# Patient Record
Sex: Female | Born: 1935 | Race: White | Hispanic: No | Smoking: Never smoker
Health system: Southern US, Community
[De-identification: ages and names within clinical notes are randomized; demographics above are authoritative.]

## PROBLEM LIST (undated history)

## (undated) ENCOUNTER — Emergency Department: Admission: EM | Payer: Medicare Other | Source: Home / Self Care

## (undated) ENCOUNTER — Emergency Department (HOSPITAL_BASED_OUTPATIENT_CLINIC_OR_DEPARTMENT_OTHER): Discharge status: Absent without leave | Payer: Medicare Other

## (undated) DIAGNOSIS — F419 Anxiety disorder, unspecified: Secondary | ICD-10-CM

## (undated) DIAGNOSIS — C541 Malignant neoplasm of endometrium: Secondary | ICD-10-CM

## (undated) DIAGNOSIS — I4892 Unspecified atrial flutter: Secondary | ICD-10-CM

## (undated) DIAGNOSIS — J45909 Unspecified asthma, uncomplicated: Secondary | ICD-10-CM

## (undated) DIAGNOSIS — S42301A Unspecified fracture of shaft of humerus, right arm, initial encounter for closed fracture: Secondary | ICD-10-CM

## (undated) DIAGNOSIS — E039 Hypothyroidism, unspecified: Secondary | ICD-10-CM

## (undated) DIAGNOSIS — C50919 Malignant neoplasm of unspecified site of unspecified female breast: Secondary | ICD-10-CM

## (undated) DIAGNOSIS — M810 Age-related osteoporosis without current pathological fracture: Secondary | ICD-10-CM

## (undated) HISTORY — DX: Age-related osteoporosis without current pathological fracture: M81.0

## (undated) HISTORY — DX: Unspecified atrial flutter: I48.92

## (undated) HISTORY — DX: Malignant neoplasm of unspecified site of unspecified female breast: C50.919

## (undated) HISTORY — DX: Unspecified asthma, uncomplicated: J45.909

## (undated) HISTORY — DX: Unspecified fracture of shaft of humerus, right arm, initial encounter for closed fracture: S42.301A

## (undated) HISTORY — DX: Malignant neoplasm of endometrium: C54.1

---

## 1994-11-29 HISTORY — PX: OTHER SURGICAL HISTORY: SHX169

## 1999-05-05 ENCOUNTER — Ambulatory Visit (HOSPITAL_COMMUNITY): Admission: RE | Admit: 1999-05-05 | Discharge: 1999-05-05 | Payer: Self-pay | Admitting: Obstetrics & Gynecology

## 2001-08-29 HISTORY — PX: VAGINAL HYSTERECTOMY: SHX2639

## 2005-10-19 ENCOUNTER — Ambulatory Visit: Payer: Self-pay | Admitting: General Practice

## 2005-10-19 ENCOUNTER — Other Ambulatory Visit: Payer: Self-pay

## 2005-10-22 ENCOUNTER — Ambulatory Visit: Payer: Self-pay | Admitting: General Practice

## 2008-11-29 DIAGNOSIS — S42301A Unspecified fracture of shaft of humerus, right arm, initial encounter for closed fracture: Secondary | ICD-10-CM

## 2008-11-29 HISTORY — DX: Unspecified fracture of shaft of humerus, right arm, initial encounter for closed fracture: S42.301A

## 2010-01-29 DIAGNOSIS — C4492 Squamous cell carcinoma of skin, unspecified: Secondary | ICD-10-CM

## 2010-01-29 HISTORY — DX: Squamous cell carcinoma of skin, unspecified: C44.92

## 2010-03-29 ENCOUNTER — Ambulatory Visit: Payer: Self-pay | Admitting: Gynecologic Oncology

## 2010-04-14 ENCOUNTER — Ambulatory Visit: Payer: Self-pay | Admitting: Gynecologic Oncology

## 2010-04-29 ENCOUNTER — Ambulatory Visit: Payer: Self-pay | Admitting: Gynecologic Oncology

## 2011-04-20 ENCOUNTER — Ambulatory Visit: Payer: Self-pay | Admitting: Gynecologic Oncology

## 2011-04-30 ENCOUNTER — Ambulatory Visit: Payer: Self-pay | Admitting: Gynecologic Oncology

## 2012-03-15 ENCOUNTER — Emergency Department: Payer: Self-pay | Admitting: *Deleted

## 2012-04-18 ENCOUNTER — Ambulatory Visit: Payer: Self-pay | Admitting: Gynecologic Oncology

## 2012-04-29 ENCOUNTER — Ambulatory Visit: Payer: Self-pay | Admitting: Gynecologic Oncology

## 2013-04-16 ENCOUNTER — Ambulatory Visit: Payer: Self-pay | Admitting: Gynecologic Oncology

## 2013-04-29 ENCOUNTER — Ambulatory Visit: Payer: Self-pay | Admitting: Gynecologic Oncology

## 2014-04-16 ENCOUNTER — Ambulatory Visit: Payer: Self-pay | Admitting: Gynecologic Oncology

## 2014-04-29 ENCOUNTER — Ambulatory Visit: Payer: Self-pay | Admitting: Gynecologic Oncology

## 2015-04-23 ENCOUNTER — Ambulatory Visit: Payer: Self-pay

## 2015-06-14 ENCOUNTER — Emergency Department
Admission: EM | Admit: 2015-06-14 | Discharge: 2015-06-14 | Disposition: A | Discharge status: Home / Self Care | Payer: Medicare Other | Attending: Emergency Medicine | Admitting: Emergency Medicine

## 2015-06-14 ENCOUNTER — Encounter: Payer: Self-pay | Admitting: Emergency Medicine

## 2015-06-14 ENCOUNTER — Emergency Department: Payer: Medicare Other

## 2015-06-14 DIAGNOSIS — Y9289 Other specified places as the place of occurrence of the external cause: Secondary | ICD-10-CM | POA: Insufficient documentation

## 2015-06-14 DIAGNOSIS — S0990XA Unspecified injury of head, initial encounter: Secondary | ICD-10-CM | POA: Diagnosis present

## 2015-06-14 DIAGNOSIS — S8001XA Contusion of right knee, initial encounter: Secondary | ICD-10-CM | POA: Diagnosis not present

## 2015-06-14 DIAGNOSIS — T07XXXA Unspecified multiple injuries, initial encounter: Secondary | ICD-10-CM

## 2015-06-14 DIAGNOSIS — Y998 Other external cause status: Secondary | ICD-10-CM | POA: Diagnosis not present

## 2015-06-14 DIAGNOSIS — S8002XA Contusion of left knee, initial encounter: Secondary | ICD-10-CM | POA: Diagnosis not present

## 2015-06-14 DIAGNOSIS — S0031XA Abrasion of nose, initial encounter: Secondary | ICD-10-CM | POA: Diagnosis not present

## 2015-06-14 DIAGNOSIS — W01198A Fall on same level from slipping, tripping and stumbling with subsequent striking against other object, initial encounter: Secondary | ICD-10-CM | POA: Diagnosis not present

## 2015-06-14 DIAGNOSIS — S0083XA Contusion of other part of head, initial encounter: Secondary | ICD-10-CM | POA: Diagnosis not present

## 2015-06-14 DIAGNOSIS — Y9389 Activity, other specified: Secondary | ICD-10-CM | POA: Insufficient documentation

## 2015-06-14 DIAGNOSIS — S66911A Strain of unspecified muscle, fascia and tendon at wrist and hand level, right hand, initial encounter: Secondary | ICD-10-CM

## 2015-06-14 DIAGNOSIS — S00511A Abrasion of lip, initial encounter: Secondary | ICD-10-CM | POA: Insufficient documentation

## 2015-06-14 HISTORY — DX: Anxiety disorder, unspecified: F41.9

## 2015-06-14 HISTORY — DX: Hypothyroidism, unspecified: E03.9

## 2015-06-14 NOTE — Discharge Instructions (Signed)
You have been seen in the Emergency Department (ED) today for a fall.  Your work up does not show any concerning injuries.  Please take over-the-counter ibuprofen and/or Tylenol as needed for your pain (unless you have an allergy or your doctor as told you not to take them), or take any prescribed medication as instructed.  Use ice packs on your knees, right wrist, and forehead.  Use your right wrist splint as needed for comfort.  Please follow up with your doctor regarding today's Emergency Department (ED) visit and your recent fall.    Return to the ED if you have any headache, confusion, slurred speech, weakness/numbness of any arm or leg, or any increased pain.   Facial or Scalp Contusion  A facial or scalp contusion is a deep bruise on the face or head. Contusions happen when an injury causes bleeding under the skin. Signs of bruising include pain, puffiness (swelling), and discolored skin. The contusion may turn blue, purple, or yellow. HOME CARE  Only take medicines as told by your doctor.  Put ice on the injured area.  Put ice in a plastic bag.  Place a towel between your skin and the bag.  Leave the ice on for 20 minutes, 2-3 times a day. GET HELP IF:  You have bite problems.  You have pain when chewing.  You are worried about your face not healing normally. GET HELP RIGHT AWAY IF:   You have severe pain or a headache and medicine does not help.  You are very tired or confused, or your personality changes.  You throw up (vomit).  You have a nosebleed that will not stop.  You see two of everything (double vision) or have blurry vision.  You have fluid coming from your nose or ear.  You have problems walking or using your arms or legs. MAKE SURE YOU:   Understand these instructions.  Will watch your condition.  Will get help right away if you are not doing well or get worse. Document Released: 11/04/2011 Document Revised: 09/05/2013 Document Reviewed:  06/28/2013 [facility] Patient Information 2015 [place], [place]. This information is not intended to replace advice given to you by your health care provider. Make sure you discuss any questions you have with your health care provider.  Hematoma A hematoma is a collection of blood under the skin, in an organ, in a body space, in a joint space, or in other tissue. The blood can clot to form a lump that you can see and feel. The lump is often firm and may sometimes become sore and tender. Most hematomas get better in a few days to weeks. However, some hematomas may be serious and require medical care. Hematomas can range in size from very small to very large. CAUSES  A hematoma can be caused by a blunt or penetrating injury. It can also be caused by spontaneous leakage from a blood vessel under the skin. Spontaneous leakage from a blood vessel is more likely to occur in older people, especially those taking blood thinners. Sometimes, a hematoma can develop after certain medical procedures. SIGNS AND SYMPTOMS   A firm lump on the body.  Possible pain and tenderness in the area.  Bruising.Blue, dark blue, purple-red, or yellowish skin may appear at the site of the hematoma if the hematoma is close to the surface of the skin. For hematomas in deeper tissues or body spaces, the signs and symptoms may be subtle. For example, an intra-abdominal hematoma may cause abdominal pain,  weakness, fainting, and shortness of breath. An intracranial hematoma may cause a headache or symptoms such as weakness, trouble speaking, or a change in consciousness. DIAGNOSIS  A hematoma can usually be diagnosed based on your medical history and a physical exam. Imaging tests may be needed if your health care provider suspects a hematoma in deeper tissues or body spaces, such as the abdomen, head, or chest. These tests may include ultrasonography or a CT scan.  TREATMENT  Hematomas usually go away on their own over time. Rarely  does the blood need to be drained out of the body. Large hematomas or those that may affect vital organs will sometimes need surgical drainage or monitoring. HOME CARE INSTRUCTIONS   Apply ice to the injured area:   Put ice in a plastic bag.   Place a towel between your skin and the bag.   Leave the ice on for 20 minutes, 2-3 times a day for the first 1 to 2 days.   After the first 2 days, switch to using warm compresses on the hematoma.   Elevate the injured area to help decrease pain and swelling. Wrapping the area with an elastic bandage may also be helpful. Compression helps to reduce swelling and promotes shrinking of the hematoma. Make sure the bandage is not wrapped too tight.   If your hematoma is on a lower extremity and is painful, crutches may be helpful for a couple days.   Only take over-the-counter or prescription medicines as directed by your health care provider. SEEK IMMEDIATE MEDICAL CARE IF:   You have increasing pain, or your pain is not controlled with medicine.   You have a fever.   You have worsening swelling or discoloration.   Your skin over the hematoma breaks or starts bleeding.   Your hematoma is in your chest or abdomen and you have weakness, shortness of breath, or a change in consciousness.  Your hematoma is on your scalp (caused by a fall or injury) and you have a worsening headache or a change in alertness or consciousness. MAKE SURE YOU:   Understand these instructions.  Will watch your condition.  Will get help right away if you are not doing well or get worse. Document Released: 06/29/2004 Document Revised: 07/18/2013 Document Reviewed: 04/25/2013 [facility] Patient Information 2015 [place], [place]. This information is not intended to replace advice given to you by your health care provider. Make sure you discuss any questions you have with your health care provider.  Wrist Splint A wrist splint holds your wrist in a set position  so that it does not move (fixed position). It can help broken bones and sprains heal faster, with less pain. It can also help relieve pressure on the nerve that runs down the middle of your arm (median nerve) into your fingers.  HOME CARE  Wear your splint as told by your doctor. It may be worn while you sleep.  Exercise your wrist as told by your doctor. These exercises help keep muscle strength in your hand and wrist. They also help to make sure you keep motion in your fingers. GET HELP RIGHT AWAY IF:   You start to lose feeling in your hand or fingers.  Your skin or fingernails turn blue or gray, or they feel cold. MAKE SURE YOU:   Understand these instructions.  Will watch your condition.  Will get help right away if you are not doing well or get worse. Document Released: 05/03/2008 Document Revised: 02/07/2012 Document Reviewed: 02/26/2014  [employer]® Patient Information ©2015 [employer]. This information is not intended to replace advice given to you by your health care provider. Make sure you discuss any questions you have with your health care provider. ° °

## 2015-06-14 NOTE — ED Provider Notes (Signed)
[facility], [place] Emergency Department Provider Note  ____________________________________________  Time seen: Approximately 3:58 PM  I have reviewed the triage vital signs and the nursing notes.   HISTORY  Chief Complaint Fall    HPI Amanda ZARO is a 81 y.o. female With no significant past medical history who presents after a mechanical fall.  She was at a gathering following a funeral when she tripped over someone who was knelt down in prayer.  She fell to the ground and struck her forehead, face and somewhat caught herself with her hands outstretched.  She also struck both knees on the ground.  She did not lose consciousness and was not stunned.  She denies headache, neck pain, shortness of breath, chest pain, abdominal pain.  She endorses mild throbbing pain in her right forehead, her right wrist, and both knees, slightly worse on the left.  She has no pain with active movement of any of her extremities.  She denies the use of any blood thinners other than an 81 mg aspirin that she takes daily.   Past Medical History  Diagnosis Date  . COPD (chronic obstructive pulmonary disease)   . Anxiety   . Hypothyroidism     There are no active problems to display for this patient.   History reviewed. No pertinent past surgical history.  No current outpatient prescriptions on file.  Allergies Review of patient's allergies indicates no known allergies.  History reviewed. No pertinent family history.  Social History History  Substance Use Topics  . Smoking status: Never Smoker   . Smokeless tobacco: Not on file  . Alcohol Use: Yes    Review of Systems Constitutional: No fever/chills Eyes: No visual changes. ENT: No sore throat. Cardiovascular: Denies chest pain. Respiratory: Denies shortness of breath. Gastrointestinal: No abdominal pain.  No nausea, no vomiting.  No diarrhea.  No constipation. Genitourinary: Negative for dysuria. Musculoskeletal:  Negative for back pain.  Negative for neck pain.  Mild throbbing pain in right forehead, right wrist, and bilateral knees. Skin: Negative for rash. Neurological: Negative for headaches, focal weakness or numbness.  10-point ROS otherwise negative.  ____________________________________________   PHYSICAL EXAM:  VITAL SIGNS: ED Triage Vitals  Enc Vitals Group     BP 06/14/15 1547 185/105 mmHg     Pulse Rate 06/14/15 1547 83     Resp 06/14/15 1547 18     Temp --      Temp Source 06/14/15 1547 Oral     SpO2 06/14/15 1547 99 %     Weight 06/14/15 1547 143 lb (64.864 kg)     Height 06/14/15 1547 5\' 1"  (1.549 m)     Head Cir --      Peak Flow --      Pain Score 06/14/15 1550 4     Pain Loc --      Pain Edu? --      Excl. in [place]? --     Constitutional: Alert and oriented. Well appearing and in no acute distress. Eyes: Conjunctivae are normal. PERRL. EOMI. Head: Large hematoma with mild abrasion on the medial right forehead above the right eye.  No hemotympanum.  No Battle sign or raccoon eyes. Nose: No congestion/rhinnorhea.  No epistaxis Mouth/Throat: Mucous membranes are moist.  Oropharynx non-erythematous. Neck: No stridor.  No cervical spine tenderness to palpation.  No pain with full range of motion of the neck including flexion and extension. Cardiovascular: Normal rate, regular rhythm. Grossly normal heart sounds.  Good peripheral  circulation. Respiratory: Normal respiratory effort.  No retractions. Lungs CTAB. Gastrointestinal: Soft and nontender. No distention. No abdominal bruits. No CVA tenderness. Musculoskeletal: No pain or tenderness with active and passive range of motion of any of her extremities.  Her pelvis is stable.  She has a contusion with slight ecchymosis and erythema and some swelling of her left knee, but no pain when waxing or extending the knee.  She has some erythema consistent with a mild contusion on the right knee but no significant effusion.  She reports  some pain in her right wrist but there is no swelling, deformity, tenderness to palpation, nor decrease of strength or sensation.  She has no snuffbox tenderness. Neurologic:  Normal speech and language. No gross focal neurologic deficits are appreciated.  Skin:  Skin is warm, dry and intact except for superficial abrasions to the right side of her upper lip, her nose, and over the hematoma on the right side of her forehead. No rash noted. Psychiatric: Mood and affect are normal. Speech and behavior are normal.  ____________________________________________   LABS (all labs ordered are listed, but only abnormal results are displayed)  Not indicated ____________________________________________  EKG  Not indicated ____________________________________________  RADIOLOGY I, Teja Costen, personally viewed and evaluated these images as part of my medical decision making (plain films)  Dg Wrist Complete Right  06/14/2015   CLINICAL DATA:  81 year old female who tripped over another person and fell with wrist pain. Initial encounter.  EXAM: RIGHT WRIST - COMPLETE 3+ VIEW  COMPARISON:  None.  FINDINGS: There is soft tissue swelling about the wrist. Bone mineralization is within normal limits for age. Distal radius and ulna appear intact. Carpal bone alignment within normal limits. Scaphoid appears intact. No metacarpal fracture identified.  IMPRESSION: Soft tissue swelling but no No acute fracture or dislocation identified about the right wrist.   Electronically Signed   By: Genevie Ann M.D.   On: 06/14/2015 17:53   Dg Knee 2 Views Left  06/14/2015   CLINICAL DATA:  Tripped and fell.  EXAM: LEFT KNEE - 1-2 VIEW  COMPARISON:  None.  FINDINGS: There is no evidence of fracture, dislocation, or joint effusion. There is no evidence of arthropathy or other focal bone abnormality. Marked prepatellar soft tissue swelling noted. No joint effusion.  IMPRESSION: Marked prepatellar soft tissue swelling    Electronically Signed   By: Kerby Moors M.D.   On: 06/14/2015 17:51   Ct Head Wo Contrast  06/14/2015   CLINICAL DATA:  Tripped and fell.  Struck head on floor.  EXAM: CT HEAD WITHOUT CONTRAST  TECHNIQUE: Contiguous axial images were obtained from the base of the skull through the vertex without intravenous contrast.  COMPARISON:  None.  FINDINGS: There is prominence of the sulci and ventricles consistent with brain atrophy. There is mild low attenuation throughout the subcortical and periventricular white matter consistent with chronic microvascular disease. No acute brain infarct, intracranial hemorrhage or mass. No abnormal extra-axial fluid collections identified. There is a large frontal scalp hematoma. This measures 1.7 by 3.7 cm, image 16/series 2. There is mild mucosal thickening involving the left maxillary sinus. The remaining paranasal sinuses are clear. The calvarium is intact.  IMPRESSION: 1. No acute intracranial abnormalities. 2. Chronic microvascular disease and brain atrophy 3. Right frontal scalp hematoma.   Electronically Signed   By: Kerby Moors M.D.   On: 06/14/2015 17:44    ____________________________________________   PROCEDURES  Procedure(s) performed: None  Critical Care performed: No  ____________________________________________   INITIAL IMPRESSION / ASSESSMENT AND PLAN / ED COURSE  Pertinent labs & imaging results that were available during my care of the patient were reviewed by me and considered in my medical decision making (see chart for details).  In spite of the obvious trauma, the patient is well-appearing and in no acute distress.  Her lack of pain/tenderness both passively and to palpation and range of motion of her extremities and her neck is reassuring.  This time I do not believe there is any indication for imaging.  The patient agrees but wants to also discuss it with her husband when he arrives.  I gave her an ice pack for her forehead and her  left knee and I will reassess her once her husband is here.  ----------------------------------------- 6:14 PM on 06/14/2015 -----------------------------------------  Patient's imaging is all reassuring.  I discussed in great detail with the patient and her husband the results of the findings.  I gave them my usual and customary return precautions as well as a removable wrist splint for her right wrist for comfort.  They understand and agree with the plan.   ____________________________________________  FINAL CLINICAL IMPRESSION(S) / ED DIAGNOSES  Final diagnoses:  Forehead contusion, initial encounter  Traumatic hematoma of forehead, initial encounter  Multiple abrasions  Wrist strain, right, initial encounter  Knee contusion, left, initial encounter  Knee contusion, right, initial encounter      NEW MEDICATIONS STARTED DURING THIS VISIT:  There are no discharge medications for this patient.    Hinda Kehr, MD 06/14/15 2350

## 2015-06-14 NOTE — ED Notes (Signed)
Patient tripped over a person that was praying, struck her head. Denies LOC, denies use of thinners. Patient is ambulatory on arrival, presents with a large hematoma to her right eyebrow.

## 2015-12-05 ENCOUNTER — Institutional Professional Consult (permissible substitution): Payer: Medicare Other | Admitting: Internal Medicine

## 2016-02-23 ENCOUNTER — Ambulatory Visit (INDEPENDENT_AMBULATORY_CARE_PROVIDER_SITE_OTHER)
Admission: RE | Admit: 2016-02-23 | Discharge: 2016-02-23 | Disposition: A | Discharge status: Home / Self Care | Payer: Medicare Other | Source: Ambulatory Visit | Attending: Pulmonary Disease | Admitting: Pulmonary Disease

## 2016-02-23 ENCOUNTER — Ambulatory Visit (INDEPENDENT_AMBULATORY_CARE_PROVIDER_SITE_OTHER): Payer: Medicare Other | Admitting: Pulmonary Disease

## 2016-02-23 ENCOUNTER — Encounter (INDEPENDENT_AMBULATORY_CARE_PROVIDER_SITE_OTHER): Payer: Self-pay

## 2016-02-23 ENCOUNTER — Encounter: Payer: Self-pay | Admitting: Pulmonary Disease

## 2016-02-23 VITALS — BP 140/84 | HR 61 | Ht 61.0 in | Wt 145.0 lb

## 2016-02-23 DIAGNOSIS — R938 Abnormal findings on diagnostic imaging of other specified body structures: Secondary | ICD-10-CM

## 2016-02-23 DIAGNOSIS — J449 Chronic obstructive pulmonary disease, unspecified: Secondary | ICD-10-CM | POA: Diagnosis not present

## 2016-02-23 DIAGNOSIS — J45909 Unspecified asthma, uncomplicated: Secondary | ICD-10-CM | POA: Insufficient documentation

## 2016-02-23 DIAGNOSIS — R9389 Abnormal findings on diagnostic imaging of other specified body structures: Secondary | ICD-10-CM

## 2016-02-23 DIAGNOSIS — R942 Abnormal results of pulmonary function studies: Secondary | ICD-10-CM | POA: Diagnosis not present

## 2016-02-23 NOTE — Progress Notes (Signed)
Subjective:    Patient ID: Amanda Dalton, female    DOB: 1931/04/28, 82 y.o.   MRN: [id number]  HPI Chief Complaint  Patient presents with  . Advice Only    Referred by Dr. Kary Kos for COPD- pt switching from Dr. Raul Del to here.     This is a pleasant 82 year old female with a past medical history significant for breast cancer who comes to my clinic for evaluation of possible COPD. 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 She said that as a child she had no respiratory illnesses though she did have diphtheria and she was diagnosed with rheumatic fever at one point.  She says that approximately 3 years ago she was diagnosed with breast cancer and was undergoing treatment at [facility]. At that time she had a chest x-ray which apparently showed COPD. This was surprising to her and her primary care physician and she had minimal respiratory complaints since she was referred to a pulmonologist to then confirmed the diagnosis of COPD. She's been treated with Advair since then.  She tells me though that she has very little respiratory complaints. She will have some shortness of breath with heavy exertion from time to time but she does not have a daily cough, she does not wheeze and she does not have recurrent bronchitis. She's never been hospitalized for respiratory problem. She says that she will have some allergic rhinitis throughout the course of the year but this is not a persistent problem. She says that her pulmonologist is told her on a recent visit that he thought she had more asthma than COPD.  She's here to see me today for a second opinion on her diagnosis.   Past Medical History  Diagnosis Date  . COPD (chronic obstructive pulmonary disease) ([place])   . Anxiety   . Hypothyroidism      Family History  Problem Relation Age of Onset  . Breast cancer Sister      Social History   Social History  .  Marital Status: Married    Spouse Name: N/A  . Number of Children: N/A  . Years of Education: N/A   Occupational History  . Not on file.   Social History Main Topics  . Smoking status: Never Smoker   . Smokeless tobacco: Never Used  . Alcohol Use: 0.0 oz/week    0 Standard drinks or equivalent per week  . Drug Use: Not on file  . Sexual Activity: Not on file   Other Topics Concern  . Not on file   Social History Narrative     No Known Allergies   No outpatient prescriptions prior to visit.   No facility-administered medications prior to visit.       Review of Systems  Constitutional: Negative for fever, chills, diaphoresis, appetite change and fatigue.  HENT: Positive for nosebleeds, rhinorrhea and sinus pressure. Negative for congestion, hearing loss, postnasal drip, sore throat and trouble swallowing.   Eyes: Negative for discharge, redness and visual disturbance.  Respiratory: Negative for cough, choking, chest tightness, shortness of breath and wheezing.   Cardiovascular: Negative for chest pain and leg swelling.  Gastrointestinal: Negative for nausea, abdominal pain, diarrhea, constipation and blood in stool.  Genitourinary: Negative for dysuria, frequency and hematuria.  Musculoskeletal: Negative for myalgias, joint swelling, arthralgias and neck stiffness.  Skin: Negative for color change, pallor and rash.  Neurological: Negative for dizziness, seizures, facial  asymmetry, speech difficulty, light-headedness, numbness and headaches.  Hematological: Negative for adenopathy. Does not bruise/bleed easily.       Objective:   Physical Exam  Filed Vitals:   02/23/16 1113  BP: 140/84  Pulse: 61  Height: 5\' 1"  (1.549 m)  Weight: 145 lb (65.772 kg)  SpO2: 97%   100% RA with walking, dropped to 98% on RA  RA  Gen: well appearing, no acute distress HENT: NCAT, OP clear, neck supple without masses Eyes: PERRL, EOMi Lymph: no cervical lymphadenopathy PULM:  CTA B CV: RRR, no mgr, no JVD GI: BS+, soft, nontender, no hsm Derm: no rash or skin breakdown MSK: normal bulk and tone Neuro: A&Ox4, CN II-XII intact, strength 5/5 in all 4 extremities Psyche: normal mood and affect  Simple spirometry today showed evidence of moderate airflow obstruction  Records from Dr. Raul Del were reviewed where she has been diagnosed with stage II COPD and treated with Advair.     Assessment & Plan:  Abnormal PFT Ms. Ristau has abnormal PFT's but no history of smoking. Further, she only reports allergic rhinitis symptoms and the very rare episode of breathing difficulty.  I tried to press for more details about the dyspnea, and she can only recall one episode of dyspnea and wheezing in the last year.  COPD is a syndrome of shortness of breath, cough, chest tightness typically with episodes of bronchitis. Fortunately it sounds like she does not experience this on a regular basis so at this time though she has abnormal PFTs I cannot diagnose her with that.  She may have a form of asthma given her allergy symptoms. It should be noted that airflow obstruction is not uncommon in octogenarians and so the PFT abnormality may just be due to her age.  Plan: Stop Advair for now Continue albuterol as needed, I've advised her to keep it on her at all times Follow-up with me in 3 months to see how she is doing Chest x-ray today to look for evidence of underlying lung disease given the airflow abnormality seen on PFT     Current outpatient prescriptions:  .  Albuterol Sulfate (PROAIR RESPICLICK) 123XX123 (90 Base) MCG/ACT AEPB, Inhale 2 puffs into the lungs every 6 (six) hours as needed., Disp: , Rfl:  .  aspirin 81 MG tablet, Take 81 mg by mouth daily., Disp: , Rfl:  .  Calcium Carbonate-Vitamin D (CALCIUM-VITAMIN D) 500-200 MG-UNIT tablet, Take 1 tablet by mouth daily., Disp: , Rfl:  .  cetirizine (ZYRTEC) 10 MG chewable tablet, Chew 10 mg by mouth daily., Disp: , Rfl:  .   cholecalciferol (VITAMIN D) 1000 units tablet, Take 2,000 Units by mouth daily., Disp: , Rfl:  .  citalopram (CELEXA) 20 MG tablet, Take 30 mg by mouth daily., Disp: , Rfl:  .  Flaxseed, Linseed, (FLAX SEED OIL PO), Take 1 capsule by mouth daily., Disp: , Rfl:  .  fluticasone-salmeterol (ADVAIR HFA) 115-21 MCG/ACT inhaler, Inhale 2 puffs into the lungs 2 (two) times daily., Disp: , Rfl:  .  levothyroxine (SYNTHROID, LEVOTHROID) 75 MCG tablet, Take 75 mcg by mouth daily before breakfast., Disp: , Rfl:  .  magnesium oxide (MAG-OX) 400 MG tablet, Take 400 mg by mouth daily., Disp: , Rfl:  .  montelukast (SINGULAIR) 10 MG tablet, Take 10 mg by mouth at bedtime., Disp: , Rfl:  .  Multiple Vitamin (MULTIVITAMIN WITH MINERALS) TABS tablet, Take 1 tablet by mouth daily., Disp: , Rfl:  .  vitamin C (  ASCORBIC ACID) 500 MG tablet, Take 500 mg by mouth daily., Disp: , Rfl:

## 2016-02-23 NOTE — Patient Instructions (Signed)
Try stopping the Advair, if you have increasing cough, wheeze, or shortness of breath and start taking it again We will call you with the results of the chest x-ray I will see you back in 4 months or sooner if needed

## 2016-02-23 NOTE — Assessment & Plan Note (Signed)
Ms. Ille has abnormal PFT's but no history of smoking. Further, she only reports allergic rhinitis symptoms and the very rare episode of breathing difficulty.  I tried to press for more details about the dyspnea, and she can only recall one episode of dyspnea and wheezing in the last year.  COPD is a syndrome of shortness of breath, cough, chest tightness typically with episodes of bronchitis. Fortunately it sounds like she does not experience this on a regular basis so at this time though she has abnormal PFTs I cannot diagnose her with that.  She may have a form of asthma given her allergy symptoms. It should be noted that airflow obstruction is not uncommon in octogenarians and so the PFT abnormality may just be due to her age.  Plan: Stop Advair for now Continue albuterol as needed, I've advised her to keep it on her at all times Follow-up with me in 3 months to see how she is doing Chest x-ray today to look for evidence of underlying lung disease given the airflow abnormality seen on PFT

## 2016-06-24 ENCOUNTER — Encounter: Payer: Self-pay | Admitting: Pulmonary Disease

## 2016-06-24 ENCOUNTER — Ambulatory Visit (INDEPENDENT_AMBULATORY_CARE_PROVIDER_SITE_OTHER): Payer: Medicare Other | Admitting: Pulmonary Disease

## 2016-06-24 DIAGNOSIS — J4520 Mild intermittent asthma, uncomplicated: Secondary | ICD-10-CM | POA: Diagnosis not present

## 2016-06-24 NOTE — Assessment & Plan Note (Signed)
Amanda Dalton has what appears to be mild intermittent asthma which is stable.  Her PFTs show non-reversible airflow obstruction which is likely due more to her age than a disease process.  Today we discussed the importance of infection prevention and immunizations.  Plan: Stay off of Advair Use prn albuterol, if she needs it more than > 2x/week she will need a controller Flu shot in the fall Follow up with [place] as needed

## 2016-06-24 NOTE — Progress Notes (Signed)
Subjective:    Patient ID: Amanda Dalton, female    DOB: 09/29/1931, 82 y.o.   MRN: [id number]  Synopsis: First seen in 2017 for pulmonary function test that showed airflow obstruction but fortunately she had very few symptoms. It is felt this may be due to some degree of chronic asthma. She is a lifelong nonsmoker. March 2017 pulmonary function testing ratio 68%, FEV1 1.09 L (67% protected) deceased, FVC 1.60 (this he 71% predicted) March 2016 spirometry testing from [facility]: Ratio 61%, FEV1 1.35 L, FVC 2.2 L (disease 102% predicted)  HPI Chief Complaint  Patient presents with  . Follow-up    pt states she is doing well, states she has been doing better since off of Advair.     Ralene stopped taking advair and she has ben doing well since then.   She denies any sort of breathing difficulty. [place] have any breathing difficulty but she had some ankle swelling. She denies cough, dyspnea, but she says that she has used albuterol about 2 times since the last visit.  Past Medical History:  Diagnosis Date  . Anxiety   . Asthma   . Hypothyroidism       Review of Systems     Objective:   Physical Exam  Vitals:   06/24/16 1329  BP: 116/66  Pulse: 73  SpO2: 97%  Weight: 150 lb (68 kg)  Height: 5\' 1"  (1.549 m)    Gen: well appearing HENT: OP clear, TM's clear, neck supple PULM: CTA B, normal percussion CV: RRR, no mgr, trace edema GI: BS+, soft, nontender Derm: no cyanosis or rash Psyche: normal mood and affect      Assessment & Plan:  Asthma Merrilee has what appears to be mild intermittent asthma which is stable.  Her PFTs show non-reversible airflow obstruction which is likely due more to her age than a disease process.  Today we discussed the importance of infection prevention and immunizations.  Plan: Stay off of Advair Use prn albuterol, if she needs it more than > 2x/week she will need a controller Flu shot in  the fall Follow up with [place] as needed    Current Outpatient Prescriptions:  .  Albuterol Sulfate (PROAIR RESPICLICK) 123XX123 (90 Base) MCG/ACT AEPB, Inhale 2 puffs into the lungs every 6 (six) hours as needed., Disp: , Rfl:  .  aspirin 81 MG tablet, Take 81 mg by mouth daily., Disp: , Rfl:  .  Calcium Carbonate-Vitamin D (CALCIUM-VITAMIN D) 500-200 MG-UNIT tablet, Take 1 tablet by mouth daily., Disp: , Rfl:  .  cetirizine (ZYRTEC) 10 MG chewable tablet, Chew 10 mg by mouth daily., Disp: , Rfl:  .  cholecalciferol (VITAMIN D) 1000 units tablet, Take 2,000 Units by mouth daily., Disp: , Rfl:  .  citalopram (CELEXA) 20 MG tablet, Take 30 mg by mouth daily., Disp: , Rfl:  .  Flaxseed, Linseed, (FLAX SEED OIL PO), Take 1 capsule by mouth daily., Disp: , Rfl:  .  levothyroxine (SYNTHROID, LEVOTHROID) 75 MCG tablet, Take 75 mcg by mouth daily before breakfast., Disp: , Rfl:  .  magnesium oxide (MAG-OX) 400 MG tablet, Take 400 mg by mouth daily., Disp: , Rfl:  .  montelukast (SINGULAIR) 10 MG tablet, Take 10 mg by mouth at bedtime., Disp: , Rfl:  .  Multiple Vitamin (MULTIVITAMIN WITH MINERALS) TABS tablet, Take 1 tablet by mouth daily., Disp: , Rfl:  .  vitamin C (ASCORBIC ACID) 500  MG tablet, Take 500 mg by mouth daily., Disp: , Rfl:

## 2016-06-24 NOTE — Patient Instructions (Signed)
Keep taking albuterol as needed for shortness of breath Stay off of Advair Follow-up with [place] on an as-needed basis Get a flu shot in fall

## 2016-10-14 ENCOUNTER — Emergency Department
Admission: EM | Admit: 2016-10-14 | Discharge: 2016-10-14 | Disposition: A | Discharge status: Home / Self Care | Payer: Medicare Other | Attending: Emergency Medicine | Admitting: Emergency Medicine

## 2016-10-14 ENCOUNTER — Emergency Department: Payer: Medicare Other

## 2016-10-14 ENCOUNTER — Encounter: Payer: Self-pay | Admitting: Emergency Medicine

## 2016-10-14 DIAGNOSIS — Y92828 Other wilderness area as the place of occurrence of the external cause: Secondary | ICD-10-CM | POA: Diagnosis not present

## 2016-10-14 DIAGNOSIS — E039 Hypothyroidism, unspecified: Secondary | ICD-10-CM | POA: Diagnosis not present

## 2016-10-14 DIAGNOSIS — Z23 Encounter for immunization: Secondary | ICD-10-CM | POA: Insufficient documentation

## 2016-10-14 DIAGNOSIS — Z7982 Long term (current) use of aspirin: Secondary | ICD-10-CM | POA: Insufficient documentation

## 2016-10-14 DIAGNOSIS — S0121XA Laceration without foreign body of nose, initial encounter: Secondary | ICD-10-CM

## 2016-10-14 DIAGNOSIS — S0181XA Laceration without foreign body of other part of head, initial encounter: Secondary | ICD-10-CM | POA: Diagnosis not present

## 2016-10-14 DIAGNOSIS — S0083XA Contusion of other part of head, initial encounter: Secondary | ICD-10-CM

## 2016-10-14 DIAGNOSIS — S0990XA Unspecified injury of head, initial encounter: Secondary | ICD-10-CM | POA: Diagnosis present

## 2016-10-14 DIAGNOSIS — Y939 Activity, unspecified: Secondary | ICD-10-CM | POA: Diagnosis not present

## 2016-10-14 DIAGNOSIS — Y999 Unspecified external cause status: Secondary | ICD-10-CM | POA: Insufficient documentation

## 2016-10-14 DIAGNOSIS — J45909 Unspecified asthma, uncomplicated: Secondary | ICD-10-CM | POA: Diagnosis not present

## 2016-10-14 DIAGNOSIS — W0110XA Fall on same level from slipping, tripping and stumbling with subsequent striking against unspecified object, initial encounter: Secondary | ICD-10-CM | POA: Diagnosis not present

## 2016-10-14 DIAGNOSIS — W19XXXA Unspecified fall, initial encounter: Secondary | ICD-10-CM

## 2016-10-14 MED ORDER — LIDOCAINE-EPINEPHRINE-TETRACAINE (LET) SOLUTION
NASAL | Status: AC
  Filled 2016-10-14: qty 3

## 2016-10-14 MED ORDER — TETANUS-DIPHTH-ACELL PERTUSSIS 5-2.5-18.5 LF-MCG/0.5 IM SUSP
0.5000 mL | Freq: Once | INTRAMUSCULAR | Status: AC
  Administered 2016-10-14: 0.5 mL via INTRAMUSCULAR
  Filled 2016-10-14: qty 0.5

## 2016-10-14 MED ORDER — LIDOCAINE-EPINEPHRINE-TETRACAINE (LET) SOLUTION
3.0000 mL | Freq: Once | NASAL | Status: DC
  Filled 2016-10-14: qty 3

## 2016-10-14 NOTE — Discharge Instructions (Signed)
Please apply ice to the forehead and face 20 minutes every hour for the next few days. Return to the ER immediately for any sudden headaches, increasing pain, redness drainage, worsening symptoms or urgent changes in her health. Follow-up with primary care physician in 5-7 days for recheck. He may shower, do not submerge Dermabond underwater.

## 2016-10-14 NOTE — ED Provider Notes (Signed)
[facility] Provider Note   CSN: [id number] Arrival date & time: 10/14/16  1659     History   Chief Complaint Chief Complaint  Patient presents with  . Fall    HPI Amanda Dalton is a 82 y.o. female presents to the emergency department for evaluation of fall. Patient fell just prior to arrival at [facility]. Fall was mechanical, she tripped over a concrete walkway. She fell onto her face and developed laceration to the forehead, nose. She denies any headache, loss of consciousness, neck pain or any other injuries throughout her body. She has been ambulatory since the injury. Husband states she appears well with no signs of confusion, has had no nausea or vomiting and did not lose consciousness. Tetanus is not up-to-date, she is currently take an aspirin. Her pain is mild. She does not feel dizzy, lightheadedness, vision changes, chest pain or shortness of breath.  HPI  Past Medical History:  Diagnosis Date  . Anxiety   . Asthma   . Hypothyroidism     Patient Active Problem List   Diagnosis Date Noted  . Abnormal CXR (chest x-ray) 02/23/2016  . Asthma 02/23/2016    Past Surgical History:  Procedure Laterality Date  . mastectomy    . VAGINAL HYSTERECTOMY      OB History    No data available       Home Medications    Prior to Admission medications   Medication Sig Start Date End Date Taking? Authorizing Provider  Albuterol Sulfate (PROAIR RESPICLICK) 123XX123 (90 Base) MCG/ACT AEPB Inhale 2 puffs into the lungs every 6 (six) hours as needed.    Historical Provider, MD  aspirin 81 MG tablet Take 81 mg by mouth daily.    Historical Provider, MD  Calcium Carbonate-Vitamin D (CALCIUM-VITAMIN D) 500-200 MG-UNIT tablet Take 1 tablet by mouth daily.    Historical Provider, MD  cetirizine (ZYRTEC) 10 MG chewable tablet Chew 10 mg by mouth daily.    Historical Provider, MD  cholecalciferol (VITAMIN D) 1000 units tablet Take 2,000 Units by mouth daily.    Historical  Provider, MD  citalopram (CELEXA) 20 MG tablet Take 30 mg by mouth daily.    Historical Provider, MD  Flaxseed, Linseed, (FLAX SEED OIL PO) Take 1 capsule by mouth daily.    Historical Provider, MD  levothyroxine (SYNTHROID, LEVOTHROID) 75 MCG tablet Take 75 mcg by mouth daily before breakfast.    Historical Provider, MD  magnesium oxide (MAG-OX) 400 MG tablet Take 400 mg by mouth daily.    Historical Provider, MD  montelukast (SINGULAIR) 10 MG tablet Take 10 mg by mouth at bedtime.    Historical Provider, MD  Multiple Vitamin (MULTIVITAMIN WITH MINERALS) TABS tablet Take 1 tablet by mouth daily.    Historical Provider, MD  vitamin C (ASCORBIC ACID) 500 MG tablet Take 500 mg by mouth daily.    Historical Provider, MD    Family History Family History  Problem Relation Age of Onset  . Breast cancer Sister     Social History Social History  Substance Use Topics  . Smoking status: Never Smoker  . Smokeless tobacco: Never Used  . Alcohol use 0.0 oz/week     Allergies   Patient has no known allergies.   Review of Systems Review of Systems  Constitutional: Negative for activity change, chills, fatigue and fever.  HENT: Positive for facial swelling. Negative for congestion, sinus pressure and sore throat.   Eyes: Negative for visual disturbance.  Respiratory:  Negative for cough, chest tightness and shortness of breath.   Cardiovascular: Negative for chest pain and leg swelling.  Gastrointestinal: Negative for abdominal pain, diarrhea, nausea and vomiting.  Genitourinary: Negative for dysuria.  Musculoskeletal: Negative for arthralgias and gait problem.  Skin: Positive for wound. Negative for rash.  Neurological: Negative for weakness, numbness and headaches.  Hematological: Negative for adenopathy.  Psychiatric/Behavioral: Negative for agitation, behavioral problems and confusion.     Physical Exam Updated Vital Signs BP (!) 195/80 (BP Location: Left Arm)   Pulse 73   Temp  97.7 F (36.5 C) (Oral)   Resp 16   Ht 5\' 1"  (1.549 m)   Wt 67.1 kg   SpO2 100%   BMI 27.96 kg/m   Physical Exam  Constitutional: She is oriented to person, place, and time. She appears well-developed and well-nourished. No distress.  HENT:  Head: Normocephalic and atraumatic.  Right Ear: External ear normal.  Left Ear: External ear normal.  Mouth/Throat: Oropharynx is clear and moist.  Patient has abrasions with small stellate laceration throughout the forehead with no gaping wounds and no sign of foreign body. Patient also has a linear nasal laceration along the tip of the nose with no sign of foreign body, laceration is not gaping. Laceration along the nose is approximately 3 cm and laceration on the forehead is approximate 4 cm. Bleeding well controlled. Patient has mild ecchymosis along the bridge of the nose, no signs of epistasis. Patient has no signs of a septal hematoma.  Eyes: Conjunctivae and EOM are normal. Pupils are equal, round, and reactive to light. Right eye exhibits no discharge. Left eye exhibits no discharge.  No pain with extraocular movement of the eyes. No signs of conjunctival hemorrhage or hyphema  Neck: Normal range of motion. Neck supple.  Cardiovascular: Normal rate, regular rhythm and intact distal pulses.   Pulmonary/Chest: Effort normal and breath sounds normal. No respiratory distress. She exhibits no tenderness.  Abdominal: Soft. She exhibits no distension and no mass. There is no tenderness. There is no rebound and no guarding. No hernia.  Musculoskeletal: Normal range of motion.  Patient with no tenderness to palpation throughout the cervical thoracic or lumbar spine. Full range of motion of the shoulders elbows and wrist, hips knees and ankles with no discomfort. She is ambulatory with no antalgic component. She ambulates with no assisted devices.  Neurological: She is alert and oriented to person, place, and time. She has normal reflexes. She displays  normal reflexes. No cranial nerve deficit or sensory deficit. She exhibits normal muscle tone. Coordination normal.  Skin: Skin is warm and dry.  Psychiatric: She has a normal mood and affect. Her behavior is normal. Judgment and thought content normal.     ED Treatments / Results  Labs (all labs ordered are listed, but only abnormal results are displayed) Labs Reviewed - No data to display  EKG  EKG Interpretation None       Radiology Ct Head Wo Contrast  Result Date: 10/14/2016 CLINICAL DATA:  Status post fall, with abrasions to the face and nose. Initial encounter. EXAM: CT HEAD WITHOUT CONTRAST CT MAXILLOFACIAL WITHOUT CONTRAST TECHNIQUE: Multidetector CT imaging of the head and maxillofacial structures were performed using the standard protocol without intravenous contrast. Multiplanar CT image reconstructions of the maxillofacial structures were also generated. COMPARISON:  CT of the head performed 06/14/2015 FINDINGS: CT HEAD FINDINGS Brain: No evidence of acute infarction, hemorrhage, hydrocephalus, extra-axial collection or mass lesion/mass effect. Prominence of the ventricles  and sulci reflects mild to moderate cortical volume loss. Mild cerebellar atrophy is noted. Mild periventricular white matter change likely reflects small vessel ischemic microangiopathy. The brainstem and fourth ventricle are within normal limits. The basal ganglia are unremarkable in appearance. The cerebral hemispheres demonstrate grossly normal gray-white differentiation. No mass effect or midline shift is seen. Vascular: No hyperdense vessel or unexpected calcification. Skull: There is no evidence of fracture; visualized osseous structures are unremarkable in appearance. Other: Soft tissue swelling and laceration is noted overlying the frontal calvarium. CT MAXILLOFACIAL FINDINGS Osseous: There is no evidence of fracture or dislocation. The maxilla and mandible appear intact. The nasal bone is unremarkable  in appearance. The visualized dentition demonstrates no acute abnormality. Degenerative change is noted at the temporomandibular joints bilaterally, with flattening of the mandibular condylar heads. Orbits: The orbits are intact bilaterally. Sinuses: Mucosal thickening is noted at the left maxillary sinus. The remaining visualized paranasal sinuses and mastoid air cells are well-aerated. Soft tissues: Soft tissue swelling is noted overlying the frontal calvarium, with associated laceration. Soft tissue swelling extends over the nose. The parapharyngeal fat planes are preserved. The nasopharynx, oropharynx and hypopharynx are unremarkable in appearance. The visualized portions of the valleculae and piriform sinuses are grossly unremarkable. The parotid and submandibular glands are within normal limits. No cervical lymphadenopathy is seen. IMPRESSION: 1. No evidence of traumatic intracranial injury or fracture. 2. No evidence of fracture or dislocation with regard to the maxillofacial structures. 3. Soft tissue swelling and laceration overlying the frontal calvarium. Soft tissue swelling extends over the nose. 4. Mild to moderate cortical volume loss and scattered small vessel ischemic microangiopathy. 5. Degenerative change at the temporomandibular joints bilaterally, with flattening of the mandibular condylar heads. 6. Mucosal thickening at the left maxillary sinus. Electronically Signed   By: Garald Balding M.D.   On: 10/14/2016 18:29   Ct Maxillofacial Wo Contrast  Result Date: 10/14/2016 CLINICAL DATA:  Status post fall, with abrasions to the face and nose. Initial encounter. EXAM: CT HEAD WITHOUT CONTRAST CT MAXILLOFACIAL WITHOUT CONTRAST TECHNIQUE: Multidetector CT imaging of the head and maxillofacial structures were performed using the standard protocol without intravenous contrast. Multiplanar CT image reconstructions of the maxillofacial structures were also generated. COMPARISON:  CT of the head  performed 06/14/2015 FINDINGS: CT HEAD FINDINGS Brain: No evidence of acute infarction, hemorrhage, hydrocephalus, extra-axial collection or mass lesion/mass effect. Prominence of the ventricles and sulci reflects mild to moderate cortical volume loss. Mild cerebellar atrophy is noted. Mild periventricular white matter change likely reflects small vessel ischemic microangiopathy. The brainstem and fourth ventricle are within normal limits. The basal ganglia are unremarkable in appearance. The cerebral hemispheres demonstrate grossly normal gray-white differentiation. No mass effect or midline shift is seen. Vascular: No hyperdense vessel or unexpected calcification. Skull: There is no evidence of fracture; visualized osseous structures are unremarkable in appearance. Other: Soft tissue swelling and laceration is noted overlying the frontal calvarium. CT MAXILLOFACIAL FINDINGS Osseous: There is no evidence of fracture or dislocation. The maxilla and mandible appear intact. The nasal bone is unremarkable in appearance. The visualized dentition demonstrates no acute abnormality. Degenerative change is noted at the temporomandibular joints bilaterally, with flattening of the mandibular condylar heads. Orbits: The orbits are intact bilaterally. Sinuses: Mucosal thickening is noted at the left maxillary sinus. The remaining visualized paranasal sinuses and mastoid air cells are well-aerated. Soft tissues: Soft tissue swelling is noted overlying the frontal calvarium, with associated laceration. Soft tissue swelling extends over the nose. The  parapharyngeal fat planes are preserved. The nasopharynx, oropharynx and hypopharynx are unremarkable in appearance. The visualized portions of the valleculae and piriform sinuses are grossly unremarkable. The parotid and submandibular glands are within normal limits. No cervical lymphadenopathy is seen. IMPRESSION: 1. No evidence of traumatic intracranial injury or fracture. 2. No  evidence of fracture or dislocation with regard to the maxillofacial structures. 3. Soft tissue swelling and laceration overlying the frontal calvarium. Soft tissue swelling extends over the nose. 4. Mild to moderate cortical volume loss and scattered small vessel ischemic microangiopathy. 5. Degenerative change at the temporomandibular joints bilaterally, with flattening of the mandibular condylar heads. 6. Mucosal thickening at the left maxillary sinus. Electronically Signed   By: Garald Balding M.D.   On: 10/14/2016 18:29    Procedures Procedures (including critical care time) LACERATION REPAIR Performed by: Feliberto Gottron Authorized by: Feliberto Gottron Consent: Verbal consent obtained. Risks and benefits: risks, benefits and alternatives were discussed Consent given by: patient Patient identity confirmed: provided demographic data Prepped and Draped in normal sterile fashion Wound explored  Laceration Location: Forehead and nose  Laceration Length: 4+3 cm  No Foreign Bodies seen or palpated  Anesthesia: local infiltration  Local anesthetic: Topical lidocaine, epinephrine, tetracaine Anesthetic total: 3 ml  Irrigation method: syringe Amount of cleaning: standard  Skin closure: Topical Dermabond   Number of sutures: 0   Technique: Wound thoroughly irrigated with saline and Betadine, dried completely then Dermabond applied   Patient tolerance: Patient tolerated the procedure well with no immediate complications.   Medications Ordered in ED Medications  lidocaine-EPINEPHrine-tetracaine (LET) solution (not administered)  lidocaine-EPINEPHrine-tetracaine (LET) solution (not administered)  Tdap (BOOSTRIX) injection 0.5 mL (0.5 mLs Intramuscular Given 10/14/16 1847)     Initial Impression / Assessment and Plan / ED Course  I have reviewed the triage vital signs and the nursing notes.  Pertinent labs & imaging results that were available during my  care of the patient were reviewed by me and considered in my medical decision making (see chart for details).  Clinical Course    82 year old female with mechanical fall just prior to arrival. She suffered laceration with hematoma to the forehead and nose. Dermabond applied after thorough irrigation. CT of the head and maxillofacial regions are normal. Patient will ice the areas, she is educated on signs and symptoms to return to the emergency department for. Tetanus is updated. Final diagnoses:  Fall, initial encounter  Contusion of face, initial encounter  Laceration of nose, initial encounter  Forehead laceration, initial encounter    New Prescriptions New Prescriptions   No medications on file     Duanne Guess, PA-C 10/14/16 [place], MD 10/14/16 2340

## 2016-10-14 NOTE — ED Triage Notes (Signed)
Patient presents to the ED post fall with abrasions to her face and nose.  Patient states, "I was trying to avoid one uneven spot and I stepped on another uneven spot and down I went."  Patient reports soreness bilaterally to her knees but reports ability to bear weight on her knees.  Patient reports taking baby aspirin daily.  Patient denies losing consciousness.  Patient is alert and oriented x 4.  No obvious distress at this time.

## 2016-10-14 NOTE — ED Notes (Signed)
Patient transported to CT 

## 2016-11-17 ENCOUNTER — Telehealth: Payer: Self-pay

## 2016-11-17 ENCOUNTER — Encounter: Payer: Self-pay | Admitting: Internal Medicine

## 2016-11-17 ENCOUNTER — Encounter: Payer: Self-pay | Admitting: Nurse Practitioner

## 2016-11-17 ENCOUNTER — Ambulatory Visit (INDEPENDENT_AMBULATORY_CARE_PROVIDER_SITE_OTHER): Payer: Medicare Other | Admitting: Nurse Practitioner

## 2016-11-17 VITALS — BP 120/84 | HR 126 | Ht 61.0 in | Wt 148.5 lb

## 2016-11-17 DIAGNOSIS — I4892 Unspecified atrial flutter: Secondary | ICD-10-CM | POA: Diagnosis not present

## 2016-11-17 DIAGNOSIS — Z136 Encounter for screening for cardiovascular disorders: Secondary | ICD-10-CM | POA: Diagnosis not present

## 2016-11-17 DIAGNOSIS — E039 Hypothyroidism, unspecified: Secondary | ICD-10-CM

## 2016-11-17 MED ORDER — METOPROLOL TARTRATE 25 MG PO TABS: 25.0000 mg | ORAL_TABLET | Freq: Two times a day (BID) | ORAL | 3 refills | Status: DC

## 2016-11-17 MED ORDER — APIXABAN 5 MG PO TABS: 5.0000 mg | ORAL_TABLET | Freq: Two times a day (BID) | ORAL | 6 refills | Status: DC

## 2016-11-17 NOTE — Progress Notes (Signed)
Cardiology Clinic Note   Patient Name: Amanda Dalton Date of Encounter: 11/17/2016  Primary Care Provider:  [place] Pink, MD Primary Cardiologist:  New - pt will f/u with Dr. Rockey Situ  Patient Profile    82 y/o ? w/o prior cardiac hx, who saw her PCP today and was found to be in rapid atrial flutter.  Past Medical History    Past Medical History:  Diagnosis Date  . Anxiety   . Asthma   . Atrial flutter ([place])    a. Dx 11/17/2016-->CHA2DS2VASc = 3-->Eliquis 5 mg BID.  Marland Kitchen Breast cancer ([place])    a. 1996 s/p R modified radical mastectomy-->chemo w/ tamoxifen.  Marland Kitchen COPD (chronic obstructive pulmonary disease) ([place])   . Endometrial cancer ([place])    a. 08/2001 s/p hysterectomy. No node involvement.  . Fracture of right humerus 11/2008  . Hypothyroidism   . Osteoporosis    Past Surgical History:  Procedure Laterality Date  . Right Mastectomy  1996  . VAGINAL HYSTERECTOMY  08/2001    Allergies  Allergies  Allergen Reactions  . Paxil [Paroxetine Hcl]     Nausea     History of Present Illness    82 y/o ? with a h/o hypothyroidism, anxiety, and asthma.  She has no prior cardiac history and denies any prior or recent h/o palpitations, chest pain, dyspnea, pnd, orthopnea, n, v, dizziness, syncope, edema, or early satiety.  She drinks at least 2 glasses of wine each night and says last night she was at a party and "may have overdone it."  She was evaluated by her PCP today for a routine f/u and was noted to be tachycardic.  ECG was performed and showed SVT @ 120 bpm.  She was asymptomatic.  Our office was contacted and she was added to my schedule today.  We repeated a 12 lead rhythm strip here and rhythm appeared to be atrial flutter w/ 2:1 block though question of atrial tachycardia remained.  After discussion with Dr. Saunders Revel, an IV was placed and she was attached to the [place].  With the rhythm strip running, she was given adenosine 6 mg IV x 1 with a 20 ml bolus to follow.  HR slowed and  flutter waves were clearly noted prior to resumption of 2:1 flutter @ a rate of 124.  She had mild flushing following adenosine push, but overall tolerated this well.  Home Medications    Prior to Admission medications   Medication Sig Start Date End Date Taking? Authorizing Provider  Albuterol Sulfate (PROAIR RESPICLICK) 123XX123 (90 Base) MCG/ACT AEPB Inhale 2 puffs into the lungs every 6 (six) hours as needed.   Yes Historical Provider, MD  Calcium Carbonate-Vitamin D (CALCIUM-VITAMIN D) 500-200 MG-UNIT tablet Take 1 tablet by mouth daily.   Yes Historical Provider, MD  cetirizine (ZYRTEC) 10 MG chewable tablet Chew 10 mg by mouth daily.   Yes Historical Provider, MD  cholecalciferol (VITAMIN D) 1000 units tablet Take 2,000 Units by mouth daily.   Yes Historical Provider, MD  citalopram (CELEXA) 20 MG tablet Take 30 mg by mouth daily.   Yes Historical Provider, MD  Flaxseed, Linseed, (FLAX SEED OIL PO) Take 1 capsule by mouth daily.   Yes Historical Provider, MD  levothyroxine (SYNTHROID, LEVOTHROID) 75 MCG tablet Take 75 mcg by mouth daily before breakfast.   Yes Historical Provider, MD  magnesium oxide (MAG-OX) 400 MG tablet Take 400 mg by mouth daily.   Yes Historical Provider, MD  montelukast (SINGULAIR) 10  MG tablet Take 10 mg by mouth at bedtime.   Yes Historical Provider, MD  Multiple Vitamin (MULTIVITAMIN WITH MINERALS) TABS tablet Take 1 tablet by mouth daily.   Yes Historical Provider, MD  vitamin C (ASCORBIC ACID) 500 MG tablet Take 500 mg by mouth daily.   Yes Historical Provider, MD  apixaban (ELIQUIS) 5 MG TABS tablet Take 1 tablet (5 mg total) by mouth 2 (two) times daily. 11/17/16   Rogelia Mire, NP  metoprolol tartrate (LOPRESSOR) 25 MG tablet Take 1 tablet (25 mg total) by mouth 2 (two) times daily. 11/17/16 02/15/17  Rogelia Mire, NP    Family History    Family History  Problem Relation Age of Onset  . Breast cancer Sister   . Osteoporosis Sister   . Stroke  Mother     died @ 65  . Hypertension Mother   . Stroke Father     died @ 10    Social History    Social History   Social History  . Marital status: Married    Spouse name: N/A  . Number of children: N/A  . Years of education: N/A   Occupational History  . Retired    Social History Main Topics  . Smoking status: Never Smoker  . Smokeless tobacco: Never Used  . Alcohol use 1.2 oz/week    2 Glasses of wine per week     Comment: per day  . Drug use: No  . Sexual activity: No   Other Topics Concern  . 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  Cardiovascular:  No chest pain, dyspnea on exertion, edema, orthopnea, palpitations, paroxysmal nocturnal dyspnea. Dermatological: No rash, lesions/masses Respiratory: No cough, dyspnea Urologic: No hematuria, dysuria Abdominal:   No nausea, vomiting, diarrhea, bright red blood per rectum, melena, or hematemesis Neurologic:  No visual changes, wkns, changes in mental status. MSK: She recently suffered a fall after tripping on uneven pavement.  She struck her face on the ground but did not suffer any fractures or major trauma. All other systems reviewed and are otherwise negative except as noted above.  Physical Exam    VS:  BP 120/84 (BP Location: Left Arm, Patient Position: Sitting, Cuff Size: Normal)   Pulse (!) 126   Ht 5\' 1"  (1.549 m)   Wt 148 lb 8 oz (67.4 kg)   BMI 28.06 kg/m  , BMI Body mass index is 28.06 kg/m. GEN: Well nourished, well developed, in no acute distress.  HEENT: normal.  Neck: Supple, no JVD, carotid bruits, or masses. Cardiac: RRR, tachy, no murmurs, rubs, or gallops. No clubbing, cyanosis, edema.  Radials/DP/PT 2+ and equal bilaterally.  Respiratory:  Respirations regular and unlabored, clear to auscultation bilaterally. GI: Soft, nontender, nondistended, BS + x 4. MS: no  deformity or atrophy. Skin: warm and dry, no rash. Neuro:  Strength and sensation are intact. Psych: Normal affect.  Accessory Clinical Findings    ECG - Atrial flutter, 126, leftward axis, non-specific ST changes.  Assessment & Plan   1.  Atrial flutter with RVR:  Pt presents today for evaluation r/t tachycardia that was first noted @ PCP visit today.  She has been completely asymptomatic.  12 lead rhythm strip was performed and there was question of atrial flutter vs atrial tachycardia.  I discussed her  case with Dr. Saunders Revel and Ms. [place] was given adenosine 6mg  IV x 1 revealing flutter waves once HR slowed.  HR then ticked back up to 124.  She tolerated this well.  In the setting of asymptomatic atrial flutter of unknown duration and CHA2DS2VASc of 3 (age/age/gender), I will start eliquis 5 mg BID (creat 0.8 12/4, wt > 60 kg), along with lopressor 25 bid.  I will repeat cbc, bmet, mg, and TSH today (prev drawn 12/4 and nl) and arrange for f/u with either me or Dr. Rockey Situ (he sees her husband and she would like to see him also) in 3 wks.  F/u echo prior to next clinic visit.  If she remains in atrial flutter @ that point, I will arrange for outpt dccv.  If at any point over the next three weeks, she becomes more symptomatic, she is to notify us and we can arrange for TEE/DCCV sooner. We did discuss the importance of limiting alcohol.  2.  Dispo:  F/u echo.  F/u in office in 3 wks or sooner if necessary.  Murray Hodgkins, NP 11/17/2016, 4:12 PM

## 2016-11-17 NOTE — Patient Instructions (Signed)
Medication Instructions:  Your physician has recommended you make the following change in your medication:  1. START Metoprolol 25 mg Twice a day 2. START Eliquis 5 mg Twice a day   Labwork: Labs today were CBC, BMP, Mag, TSH  Testing/Procedures: Your physician has requested that you have an echocardiogram. Echocardiography is a painless test that uses sound waves to create images of your heart. It provides your doctor with information about the size and shape of your heart and how well your heart's chambers and valves are working. This procedure takes approximately one hour. There are no restrictions for this procedure.    Follow-Up: Your physician recommends that you schedule a follow-up appointment in: 3 weeks with Ignacia Bayley NP or Dr. Rockey Situ.  It was a pleasure seeing you today here in the office. Please do not hesitate to give [place] a call back if you have any further questions. [place], BSN     Echocardiogram An echocardiogram, or echocardiography, uses sound waves (ultrasound) to produce an image of your heart. The echocardiogram is simple, painless, obtained within a short period of time, and offers valuable information to your health care provider. The images from an echocardiogram can provide information such as:  Evidence of coronary artery disease (CAD).  Heart size.  Heart muscle function.  Heart valve function.  Aneurysm detection.  Evidence of a past heart attack.  Fluid buildup around the heart.  Heart muscle thickening.  Assess heart valve function. Tell a health care provider about:  Any allergies you have.  All medicines you are taking, including vitamins, herbs, eye drops, creams, and over-the-counter medicines.  Any problems you or family members have had with anesthetic medicines.  Any blood disorders you have.  Any surgeries you have had.  Any medical conditions you have.  Whether you are pregnant or may be  pregnant. What happens before the procedure? No special preparation is needed. Eat and drink normally. What happens during the procedure?  In order to produce an image of your heart, gel will be applied to your chest and a wand-like tool (transducer) will be moved over your chest. The gel will help transmit the sound waves from the transducer. The sound waves will harmlessly bounce off your heart to allow the heart images to be captured in real-time motion. These images will then be recorded.  You may need an IV to receive a medicine that improves the quality of the pictures. What happens after the procedure? You may return to your normal schedule including diet, activities, and medicines, unless your health care provider tells you otherwise. This information is not intended to replace advice given to you by your health care provider. Make sure you discuss any questions you have with your health care provider. Document Released: 11/12/2000 Document Revised: 07/03/2016 Document Reviewed: 07/23/2013 Elsevier Interactive Patient Education  2017 [employer].

## 2016-11-17 NOTE — Telephone Encounter (Signed)
Lmov for patient to call back we received a call from Dr Hedrick's office patient needed to be seen

## 2016-11-17 NOTE — Progress Notes (Signed)
Ignacia Bayley NP ordered Adenosine 6 mg IV push. IV placed in left forearm 22G and flushed with great blood return. Pads placed on chest and normal saline started at slow gravity rate. Ignacia Bayley NP present at patients side and Adenosine 6 mg IV given and flushed with 10 cc normal saline. Patient on monitor recording patients heart rate. She remained alert throughout procedure and tolerated well. IV removed, catheter intact, and dressing applied. Patient talking with Ignacia Bayley NP at this time

## 2016-11-18 ENCOUNTER — Telehealth: Payer: Self-pay | Admitting: *Deleted

## 2016-11-18 LAB — CBC WITH DIFFERENTIAL/PLATELET
BASOS ABS: 0 10*3/uL (ref 0.0–0.2)
BASOS: 0 %
EOS (ABSOLUTE): 0.1 10*3/uL (ref 0.0–0.4)
Eos: 2 %
Hematocrit: 42.1 % (ref 34.0–46.6)
Hemoglobin: 13.7 g/dL (ref 11.1–15.9)
Immature Grans (Abs): 0 10*3/uL (ref 0.0–0.1)
Immature Granulocytes: 0 %
LYMPHS ABS: 2.1 10*3/uL (ref 0.7–3.1)
Lymphs: 30 %
MCH: 31.8 pg (ref 26.6–33.0)
MCHC: 32.5 g/dL (ref 31.5–35.7)
MCV: 98 fL — AB (ref 79–97)
Monocytes Absolute: 0.5 10*3/uL (ref 0.1–0.9)
Monocytes: 7 %
NEUTROS ABS: 4.1 10*3/uL (ref 1.4–7.0)
Neutrophils: 61 %
PLATELETS: 304 10*3/uL (ref 150–379)
RBC: 4.31 x10E6/uL (ref 3.77–5.28)
RDW: 13.5 % (ref 12.3–15.4)
WBC: 6.8 10*3/uL (ref 3.4–10.8)

## 2016-11-18 LAB — BASIC METABOLIC PANEL
BUN / CREAT RATIO: 30 — AB (ref 12–28)
BUN: 21 mg/dL (ref 8–27)
CHLORIDE: 99 mmol/L (ref 96–106)
CO2: 22 mmol/L (ref 18–29)
Calcium: 9.3 mg/dL (ref 8.7–10.3)
Creatinine, Ser: 0.71 mg/dL (ref 0.57–1.00)
GFR, EST AFRICAN AMERICAN: 91 mL/min/{1.73_m2} (ref >59)
GFR, EST NON AFRICAN AMERICAN: 79 mL/min/{1.73_m2} (ref >59)
Glucose: 103 mg/dL — ABNORMAL HIGH (ref 65–99)
POTASSIUM: 4.2 mmol/L (ref 3.5–5.2)
SODIUM: 137 mmol/L (ref 134–144)

## 2016-11-18 LAB — TSH: TSH: 3.07 u[IU]/mL (ref 0.450–4.500)

## 2016-11-18 LAB — MAGNESIUM: MAGNESIUM: 2 mg/dL (ref 1.6–2.3)

## 2016-11-18 NOTE — Telephone Encounter (Signed)
11/18/16-11/28/17 approval date for Eliquis 5 mg tablet.

## 2016-11-18 NOTE — Telephone Encounter (Signed)
Pt has been approved for Eliquis 5 mg tablet through Cover my meds.   PA Case [id number] is Approved. For further questions, call [phone].

## 2016-11-30 ENCOUNTER — Telehealth: Payer: Self-pay | Admitting: Cardiovascular Disease

## 2016-11-30 MED ORDER — METOPROLOL TARTRATE 25 MG PO TABS: 12.5000 mg | ORAL_TABLET | Freq: Two times a day (BID) | ORAL | 3 refills | Status: DC

## 2016-11-30 NOTE — Telephone Encounter (Signed)
Patient says she recently started new medication per Ignacia Bayley and is not feeling well. Patient c/o diarhea, lightheaded and hr down to 60's . Please call to discuss.

## 2016-11-30 NOTE — Telephone Encounter (Signed)
Spoke w/ pt.  She reports that she has not felt well since starting metoprolol on  She had "major diarrhea" at first, but this has resolved.   Pt does not have a BP monitor at home, she checks her BP at offices and the pharmacy. BP today @ pharmacy: 130/70.   Pt reports that her HR typically runs in the mid 70s, but has dropped to low 60s. She reports low energy and lightheadedness, esp 1-2 hrs after taking her meds. Pt states that her friends only take their meds once a day and she would like to know why she has to take hers BID. Advised her that she takes the short acting metoprolol tartrate and it is to be taken twice daily. Advised her to obtain BP monitor and check BP at home, esp when she is symptomatic. She would like to know if she can reduce the dose of metoprolol and see if she feels better on 1/2 dosing. Advised her to try this for a few days, take 12.5 mg BID and see if her sx improve.  She is appreciative and will call back w/ any further questions or concerns.

## 2016-12-08 ENCOUNTER — Ambulatory Visit (INDEPENDENT_AMBULATORY_CARE_PROVIDER_SITE_OTHER): Payer: Medicare Other

## 2016-12-08 ENCOUNTER — Other Ambulatory Visit: Payer: Self-pay

## 2016-12-08 DIAGNOSIS — I4892 Unspecified atrial flutter: Secondary | ICD-10-CM | POA: Diagnosis not present

## 2016-12-10 ENCOUNTER — Encounter: Payer: Self-pay | Admitting: Cardiovascular Disease

## 2016-12-10 ENCOUNTER — Ambulatory Visit (INDEPENDENT_AMBULATORY_CARE_PROVIDER_SITE_OTHER): Payer: Medicare Other | Admitting: Cardiovascular Disease

## 2016-12-10 VITALS — BP 128/68 | HR 66 | Ht 61.0 in | Wt 151.2 lb

## 2016-12-10 DIAGNOSIS — E039 Hypothyroidism, unspecified: Secondary | ICD-10-CM | POA: Diagnosis not present

## 2016-12-10 DIAGNOSIS — Z7189 Other specified counseling: Secondary | ICD-10-CM

## 2016-12-10 DIAGNOSIS — I4892 Unspecified atrial flutter: Secondary | ICD-10-CM | POA: Diagnosis not present

## 2016-12-10 DIAGNOSIS — J45909 Unspecified asthma, uncomplicated: Secondary | ICD-10-CM

## 2016-12-10 DIAGNOSIS — F419 Anxiety disorder, unspecified: Secondary | ICD-10-CM | POA: Diagnosis not present

## 2016-12-10 NOTE — Patient Instructions (Addendum)
Medication Instructions:   Take metoprolol 1/2 once a day Wean off the metoprolol if you feel bad  Take as needed for atrial flutter, full pill   Monitor your heart rate  If fast, you are in atrial flutter  Labwork:  No new labs needed  Testing/Procedures:  No further testing at this time   I recommend watching educational videos on topics of interest to you at:       [email or web address].com  Enter code: HEARTCARE    Follow-Up: It was a pleasure seeing you in the office today. Please call us if you have new issues that need to be addressed before your next appt.  [phone]  Your physician wants you to follow-up in: 6 months.  You will receive a reminder letter in the mail two months in advance. If you don't receive a letter, please call our office to schedule the follow-up appointment.  If you need a refill on your cardiac medications before your next appointment, please call your pharmacy.

## 2016-12-10 NOTE — Progress Notes (Signed)
Cardiology Office Note  Date:  12/10/2016   ID:  ARIEONA MURCHIE, DOB 11/02/1931, MRN [id number]  PCP:  Maryland Pink, MD   Chief Complaint  Patient presents with  . other    3 week follow up. Meds reviewed by the pt. verbally. "doing well."     HPI:  83 y/o ? with a h/o hypothyroidism, anxiety, and asthma, Recent presentation to the office found to be in atrial flutter rate 120 bpm, started on anticoagulation, the Toprol for rate control with follow-up today to further evaluate her arrhythmia She feels previous episode of atrial flutter presented in the setting of attending a party  In follow-up she reports that she feels well, did not appreciate when she went from atrial flutter back to normal sinus rhythm She called our office recently reported having side effects from metoprolol 25 mg twice a day. Instructed to cut the pill in half twice a day She feels that the metoprolol 12.5 mg twice a day is still causing some fogginess in her head. She would like to wean down or stop the medication. She has been reading about the medication on the Internet.  She is willing to continue on anticoagulation out of fear of stroke She monitors heart rate daily using finger pulse oximeter She has had a history of falls, none recently  EKG on today's visit shows normal sinus rhythm with rate 66 bpm with sinus arrhythmia or APCs, no significant ST or T-wave changes  Other past medical history reviewed Previously received adenosine IV push for tachycardia revealing flutter waves CHA2DS2VASc of 3 (age/age/gender), started on eliquis 5 mg BID (creat 0.8 12/4, wt > 60 kg)  Echocardiogram 12/08/2016 Ejection fraction 50-55%, moderately elevated right heart pressures 50-55 mmHg  Had a fall Nov 2017: large laceration on forehead  Now idea how long she was in atrial flutter   PMH:   has a past medical history of Anxiety; Asthma; Atrial flutter ([place]); Breast cancer ([place]); COPD (chronic obstructive pulmonary  disease) ([place]); Endometrial cancer ([place]); Fracture of right humerus (11/2008); Hypothyroidism; and Osteoporosis.  PSH:    Past Surgical History:  Procedure Laterality Date  . Right Mastectomy  1996  . VAGINAL HYSTERECTOMY  08/2001    Current Outpatient Prescriptions  Medication Sig Dispense Refill  . Albuterol Sulfate (PROAIR RESPICLICK) 123XX123 (90 Base) MCG/ACT AEPB Inhale 2 puffs into the lungs every 6 (six) hours as needed.    Marland Kitchen apixaban (ELIQUIS) 5 MG TABS tablet Take 1 tablet (5 mg total) by mouth 2 (two) times daily. 60 tablet 6  . Calcium Carbonate-Vitamin D (CALCIUM-VITAMIN D) 500-200 MG-UNIT tablet Take 1 tablet by mouth daily.    . cetirizine (ZYRTEC) 10 MG chewable tablet Chew 10 mg by mouth daily.    . cholecalciferol (VITAMIN D) 1000 units tablet Take 2,000 Units by mouth daily.    . citalopram (CELEXA) 20 MG tablet Take 30 mg by mouth daily.    . Flaxseed, Linseed, (FLAX SEED OIL PO) Take 1 capsule by mouth daily.    Marland Kitchen levothyroxine (SYNTHROID, LEVOTHROID) 75 MCG tablet Take 75 mcg by mouth daily before breakfast.    . magnesium oxide (MAG-OX) 400 MG tablet Take 400 mg by mouth daily.    . metoprolol tartrate (LOPRESSOR) 25 MG tablet Take 0.5 tablets (12.5 mg total) by mouth 2 (two) times daily. 180 tablet 3  . montelukast (SINGULAIR) 10 MG tablet Take 10 mg by mouth at bedtime.    . Multiple Vitamin (MULTIVITAMIN WITH MINERALS)  TABS tablet Take 1 tablet by mouth daily.    . vitamin C (ASCORBIC ACID) 500 MG tablet Take 500 mg by mouth daily.     No current facility-administered medications for this visit.      Allergies:   Paxil [paroxetine hcl]   Social History:  The patient  reports that she has never smoked. She has never used smokeless tobacco. She reports that she drinks about 1.2 oz of alcohol per week . She reports that she does not use drugs.   Family History:   family history includes Breast cancer in her sister; Hypertension in her mother; Osteoporosis in her  sister; Stroke in her father and mother.    Review of Systems: Review of Systems  Constitutional: Negative.   Respiratory: Negative.   Cardiovascular: Negative.   Gastrointestinal: Negative.   Musculoskeletal: Negative.   Neurological: Negative.        Feels foggy  Psychiatric/Behavioral: Negative.   All other systems reviewed and are negative.    PHYSICAL EXAM: VS:  BP 128/68 (BP Location: Left Arm, Patient Position: Sitting, Cuff Size: Normal)   Pulse 66   Ht 5\' 1"  (1.549 m)   Wt 151 lb 4 oz (68.6 kg)   BMI 28.58 kg/m  , BMI Body mass index is 28.58 kg/m. GEN: Well nourished, well developed, in no acute distress  HEENT: normal  Neck: no JVD, carotid bruits, or masses Cardiac: RRR; no murmurs, rubs, or gallops,no edema  Respiratory:  clear to auscultation bilaterally, normal work of breathing GI: soft, nontender, nondistended, + BS MS: no deformity or atrophy  Skin: warm and dry, no rash Neuro:  Strength and sensation are intact Psych: euthymic mood, full affect    Recent Labs: 11/17/2016: BUN 21; Creatinine, Ser 0.71; Magnesium 2.0; Platelets 304; Potassium 4.2; Sodium 137; TSH 3.070    Lipid Panel No results found for: CHOL, HDL, LDLCALC, TRIG    Wt Readings from Last 3 Encounters:  12/10/16 151 lb 4 oz (68.6 kg)  11/17/16 148 lb 8 oz (67.4 kg)  10/14/16 148 lb (67.1 kg)       ASSESSMENT AND PLAN:  Atrial flutter with rapid ventricular response ([place]) - Plan: EKG 12-Lead Rhythm discussed with her in detail, including management Converted to normal sinus rhythm since her last clinic visit She reports having side effects on metoprolol Other medications discussed with her. She prefers to take one half pill once a day, possibly wean off the medication. We have recommended if she has recurrent arrhythmia as measured by tachycardia using pulse oximeter that she take full metoprolol. If she has recurrent episodes on a frequent basis, may need to be started on  alternate medication or antiarrhythmic  Encounter for anticoagulation discussion and counseling She's willing to stay on anticoagulation. Risk and benefit of anticoagulations discussed with her including risk of stroke. New prescription sent in Long discussion concerning what to do if she has a fall. Recommended she stop the blood thinner, call our office in those cases  Anxiety Reports symptoms are stable  Hypothyroidism, unspecified type Takes thyroid medication daily in the morning  Uncomplicated asthma, unspecified asthma severity, unspecified whether persistent Reports that asthma has not been a major issue recently   Total encounter time more than 25 minutes  Greater than 50% was spent in counseling and coordination of care with the patient   Disposition:   F/U  6 months   Orders Placed This Encounter  Procedures  . EKG 12-Lead  Signed, Esmond Plants, M.D., Ph.D. 12/10/2016  [place], [place]

## 2016-12-31 ENCOUNTER — Telehealth: Payer: Self-pay | Admitting: Cardiovascular Disease

## 2016-12-31 NOTE — Telephone Encounter (Signed)
Spoke w/ pt.  She reports that she has not been "right" since starting her meds. She states that her head has been peculiar since starting Eliquis. Her hip was bothering her this morning, so she got up and took a Tylenol.   She laid back down and after waking up, she feels dizzy. She does not have a BP monitor at home, she was advised by someone else to not purchase a BP monitor. She reports some recent ear pain that she has not addressed, but is pain-free today. Advised her that since she has been on Eliquis for over a month, that this is most likely not the culprit. I suspect either low BP or inner ear issue. She will have her husband take her to the nurse's station at [facility] and have them check her BP and look in her ear.  Advised her that if her BP is low, to call and we can make some med adjustments, if needed. Recommended that she see her PCP regarding recent ear pain. She is appreciative and will call back if sx persist or if her BP is low.

## 2016-12-31 NOTE — Telephone Encounter (Signed)
Pt needs to speak to someone regarding her Eliquis. States she if having dizzy spells this morning. Please call.

## 2016-12-31 NOTE — Telephone Encounter (Signed)
BP reading today with [facility] nurse is  130/82  then  132/80  HR 75   Patient says ears look clear.  Please call.

## 2017-01-04 NOTE — Telephone Encounter (Signed)
Spoke w/ pt.  Advised her that per her last of 12/10/16:  "Take metoprolol 1/2 once a day Wean off the metoprolol if you feel bad  Take as needed for atrial flutter, full pill"  She states that she has been taking every night, that her husband would not let her stop, as he was afraid that she would not atrial flutter and not know it.  Reiterated Dr. Donivan Scull instructions to her and she is agreeable to not taking it tonight 2/2 low BP & HR. Asked her to call back if readings do not improve.

## 2017-01-04 NOTE — Telephone Encounter (Signed)
Patient calling stated she has had a heavy head,  She went to see the nurse at [facility].  Pulse is 62 and regular bp 116/68 and O2 sats are 96.  Patient talked to nurse and nurse said to call office and ask if metoprolol can be reduced.  Please call patient .

## 2017-02-10 ENCOUNTER — Telehealth: Payer: Self-pay | Admitting: Cardiovascular Disease

## 2017-02-10 ENCOUNTER — Emergency Department
Admission: EM | Admit: 2017-02-10 | Discharge: 2017-02-10 | Disposition: A | Discharge status: Home / Self Care | Payer: Medicare Other | Attending: Emergency Medicine | Admitting: Emergency Medicine

## 2017-02-10 ENCOUNTER — Encounter: Payer: Self-pay | Admitting: Emergency Medicine

## 2017-02-10 DIAGNOSIS — J45909 Unspecified asthma, uncomplicated: Secondary | ICD-10-CM | POA: Diagnosis not present

## 2017-02-10 DIAGNOSIS — E039 Hypothyroidism, unspecified: Secondary | ICD-10-CM | POA: Insufficient documentation

## 2017-02-10 DIAGNOSIS — K649 Unspecified hemorrhoids: Secondary | ICD-10-CM

## 2017-02-10 DIAGNOSIS — K625 Hemorrhage of anus and rectum: Secondary | ICD-10-CM

## 2017-02-10 DIAGNOSIS — Z853 Personal history of malignant neoplasm of breast: Secondary | ICD-10-CM | POA: Insufficient documentation

## 2017-02-10 DIAGNOSIS — Z79899 Other long term (current) drug therapy: Secondary | ICD-10-CM | POA: Insufficient documentation

## 2017-02-10 LAB — COMPREHENSIVE METABOLIC PANEL
ALK PHOS: 33 U/L — AB (ref 38–126)
ALT: 18 U/L (ref 14–54)
ANION GAP: 7 (ref 5–15)
AST: 21 U/L (ref 15–41)
Albumin: 4.3 g/dL (ref 3.5–5.0)
BILIRUBIN TOTAL: 0.4 mg/dL (ref 0.3–1.2)
BUN: 26 mg/dL — ABNORMAL HIGH (ref 6–20)
CALCIUM: 9.4 mg/dL (ref 8.9–10.3)
CO2: 27 mmol/L (ref 22–32)
CREATININE: 0.9 mg/dL (ref 0.44–1.00)
Chloride: 100 mmol/L — ABNORMAL LOW (ref 101–111)
GFR, EST NON AFRICAN AMERICAN: 58 mL/min — AB (ref >60)
Glucose, Bld: 115 mg/dL — ABNORMAL HIGH (ref 65–99)
Potassium: 3.9 mmol/L (ref 3.5–5.1)
SODIUM: 134 mmol/L — AB (ref 135–145)
TOTAL PROTEIN: 7.4 g/dL (ref 6.5–8.1)

## 2017-02-10 LAB — CBC
HCT: 38.1 % (ref 35.0–47.0)
HEMOGLOBIN: 13.5 g/dL (ref 12.0–16.0)
MCH: 33.3 pg (ref 26.0–34.0)
MCHC: 35.4 g/dL (ref 32.0–36.0)
MCV: 94.1 fL (ref 80.0–100.0)
Platelets: 281 10*3/uL (ref 150–440)
RBC: 4.05 MIL/uL (ref 3.80–5.20)
RDW: 12.9 % (ref 11.5–14.5)
WBC: 7.8 10*3/uL (ref 3.6–11.0)

## 2017-02-10 NOTE — Telephone Encounter (Signed)
Patient called in stating that she has had some minor rectal bleeding and wanted to call and let us know. Checked with Christell Faith PA and he recommended that patient go to ED for further evaluation. Instructed patient to go to ED and to not stop or hold eliquis until she is evaluated for the bleeding. She states that she is currently in [facility] and will go to ED when she returns to [place]. She verbalized understanding of our conversation, agreement with plan, and had no further questions at this time.

## 2017-02-10 NOTE — Telephone Encounter (Signed)
Patient called and is having rectal blood (0-10 scale is 3) she thinks it is the   apixaban (ELIQUIS) 5 MG TABS tablet

## 2017-02-10 NOTE — ED Triage Notes (Signed)
Pt in via POV from home; pt advised per cardiologist to be evaluated in ED.  Pt reports x 2 weeks, noticing frank red blood upon wiping.  Pt denies any black, tarry stools, denies any hx of hemrhoids.  Pt is on Eliquis.  Pt A/Ox4, NAD at this time.

## 2017-02-10 NOTE — ED Provider Notes (Signed)
[facility] Emergency Department Provider Note  ____________________________________________   None    (approximate)  I have reviewed the triage vital signs and the nursing notes.   HISTORY  Chief Complaint Rectal Bleeding   HPI Amanda Dalton is a 83 y.o. female with a history of atrial flutter on eliquis who is presenting to the emergency department today with bright red blood per rectum. She says that over the past 2 weeks she has had 3 episodes were after moving her bowels she has had blood on the toilet tissue. She says that her stools have been brown. She says that otherwise she has no other complaints such as weakness or abdominal pain. She said that she had called her cardiologist earlier today when she noticed the bleeding and it was recommended that she come to the emergency department for further evaluation. She says that she also has intermittent diarrhea depending on what she eats. No known history of hemorrhoids. She is concerned about going off of her anticoagulation because she says her sister went off of Coumadin at one point with atrial fibrillation and had a stroke.   Past Medical History:  Diagnosis Date  . Anxiety   . Asthma   . Atrial flutter ([place])    a. Dx 11/17/2016-->CHA2DS2VASc = 3-->Eliquis 5 mg BID.  Marland Kitchen Breast cancer ([place])    a. 1996 s/p R modified radical mastectomy-->chemo w/ tamoxifen.  . Endometrial cancer ([place])    a. 08/2001 s/p hysterectomy. No node involvement.  . Fracture of right humerus 11/2008  . Hypothyroidism   . Osteoporosis     Patient Active Problem List   Diagnosis Date Noted  . Atrial flutter with rapid ventricular response ([place]) 12/10/2016  . Encounter for anticoagulation discussion and counseling 12/10/2016  . Anxiety 12/10/2016  . Hypothyroidism 12/10/2016  . Abnormal CXR (chest x-ray) 02/23/2016  . Uncomplicated asthma 88/91/6945    Past Surgical History:  Procedure Laterality Date  . Right  Mastectomy  1996  . VAGINAL HYSTERECTOMY  08/2001    Prior to Admission medications   Medication Sig Start Date End Date Taking? Authorizing Provider  Albuterol Sulfate (PROAIR RESPICLICK) 038 (90 Base) MCG/ACT AEPB Inhale 2 puffs into the lungs every 6 (six) hours as needed.    Historical Provider, MD  apixaban (ELIQUIS) 5 MG TABS tablet Take 1 tablet (5 mg total) by mouth 2 (two) times daily. 11/17/16   Rogelia Mire, NP  Calcium Carbonate-Vitamin D (CALCIUM-VITAMIN D) 500-200 MG-UNIT tablet Take 1 tablet by mouth daily.    Historical Provider, MD  cetirizine (ZYRTEC) 10 MG chewable tablet Chew 10 mg by mouth daily.    Historical Provider, MD  cholecalciferol (VITAMIN D) 1000 units tablet Take 2,000 Units by mouth daily.    Historical Provider, MD  citalopram (CELEXA) 20 MG tablet Take 30 mg by mouth daily.    Historical Provider, MD  Flaxseed, Linseed, (FLAX SEED OIL PO) Take 1 capsule by mouth daily.    Historical Provider, MD  levothyroxine (SYNTHROID, LEVOTHROID) 75 MCG tablet Take 75 mcg by mouth daily before breakfast.    Historical Provider, MD  magnesium oxide (MAG-OX) 400 MG tablet Take 400 mg by mouth daily.    Historical Provider, MD  metoprolol tartrate (LOPRESSOR) 25 MG tablet Take 0.5 tablets (12.5 mg total) by mouth 2 (two) times daily. 11/30/16 02/28/17  Rogelia Mire, NP  montelukast (SINGULAIR) 10 MG tablet Take 10 mg by mouth at bedtime.    Historical Provider,  MD  Multiple Vitamin (MULTIVITAMIN WITH MINERALS) TABS tablet Take 1 tablet by mouth daily.    Historical Provider, MD  vitamin C (ASCORBIC ACID) 500 MG tablet Take 500 mg by mouth daily.    Historical Provider, MD    Allergies Paxil [paroxetine hcl]  Family History  Problem Relation Age of Onset  . Breast cancer Sister   . Osteoporosis Sister   . Stroke Mother     died @ 51  . Hypertension Mother   . Stroke Father     died @ 95    Social History Social History  Substance Use Topics  . Smoking  status: Never Smoker  . Smokeless tobacco: Never Used  . Alcohol use 1.2 oz/week    2 Glasses of wine per week     Comment: per day    Review of Systems Constitutional: No fever/chills Eyes: No visual changes. ENT: No sore throat. Cardiovascular: Denies chest pain. Respiratory: Denies shortness of breath. Gastrointestinal: No abdominal pain.  No nausea, no vomiting.    No constipation. Genitourinary: Negative for dysuria. Musculoskeletal: Negative for back pain. Skin: Negative for rash. Neurological: Negative for headaches, focal weakness or numbness.  10-point ROS otherwise negative.  ____________________________________________   PHYSICAL EXAM:  VITAL SIGNS: ED Triage Vitals  Enc Vitals Group     BP 02/10/17 1630 (!) 172/88     Pulse Rate 02/10/17 1630 77     Resp 02/10/17 1630 18     Temp 02/10/17 1630 98.3 F (36.8 C)     Temp Source 02/10/17 1630 Oral     SpO2 02/10/17 1630 98 %     Weight 02/10/17 1631 149 lb (67.6 kg)     Height 02/10/17 1631 5\' 1"  (1.549 m)     Head Circumference --      Peak Flow --      Pain Score --      Pain Loc --      Pain Edu? --      Excl. in [place]? --     Constitutional: Alert and oriented. Well appearing and in no acute distress. Eyes: Conjunctivae are normal. PERRL. EOMI. Head: Atraumatic. Nose: No congestion/rhinnorhea. Mouth/Throat: Mucous membranes are moist.   Neck: No stridor.   Cardiovascular: Normal rate, regular rhythm. Grossly normal heart sounds.   Respiratory: Normal respiratory effort.  No retractions. Lungs CTAB. Gastrointestinal: Soft and nontender. No distention. Small, non-engorged hemorrhoid at 12:00. Digital rectal exam with brown to green stool which is trace heme positive. Musculoskeletal: No lower extremity tenderness nor edema.  No joint effusions. Neurologic:  Normal speech and language. No gross focal neurologic deficits are appreciated. No gait instability. Skin:  Skin is warm, dry and intact. No rash  noted. Psychiatric: Mood and affect are normal. Speech and behavior are normal.  ____________________________________________   LABS (all labs ordered are listed, but only abnormal results are displayed)  Labs Reviewed  COMPREHENSIVE METABOLIC PANEL - Abnormal; Notable for the following:       Result Value   Sodium 134 (*)    Chloride 100 (*)    Glucose, Bld 115 (*)    BUN 26 (*)    Alkaline Phosphatase 33 (*)    GFR calc non Af Amer 58 (*)    All other components within normal limits  CBC   ____________________________________________  EKG   ____________________________________________  RADIOLOGY   ____________________________________________   PROCEDURES  Procedure(s) performed:   Procedures  Critical Care performed:   ____________________________________________  INITIAL IMPRESSION / ASSESSMENT AND PLAN / ED COURSE  Pertinent labs & imaging results that were available during my care of the patient were reviewed by me and considered in my medical decision making (see chart for details).  Patient is very well-appearing and with a normal hemoglobin. Slightly elevated BUN but her last BUN was 21 and this does not appear to be a dramatic increase. Trace heme positive stool, possibly contaminated by bleeding from the external hemorrhoid. Patient with only 3 episodes of bleeding on the past 2 weeks and with normal hemoglobin and no complaints except for the bleeding only on her toilet tissue. She says that she has looked at the stool and has not noticed any blood within the stool. We will continue her on her eliquis for now and she will obtain Preparation H over-the-counter for treatment of the hemorrhoid which hopefully will resolve the bleeding. She'll also be following up with her cardiologist, Dr. Rockey Situ, for further recommendations about her eliquis and anticoagulation status. I feel that taking her off of her anticoagulant at this time surgeon take a risk than  benefit to the patient given how well she appears and her hemoglobin level. Furthermore, we discussed strict return precautions such as noticing any more blood in the stool or any other worsening or concerning symptoms such as weakness or feelings of lightheadedness. She is understanding the plan and willing to comply. Will be discharged home.      ____________________________________________   FINAL CLINICAL IMPRESSION(S) / ED DIAGNOSES  Final diagnoses:  Rectal bleeding  Hemorrhoids, unspecified hemorrhoid type      NEW MEDICATIONS STARTED DURING THIS VISIT:  New Prescriptions   No medications on file     Note:  This document was prepared using Dragon voice recognition software and may include unintentional dictation errors.    Orbie Pyo, MD 02/10/17 [phone]

## 2017-02-26 NOTE — Progress Notes (Signed)
Cardiology Office Note  Date:  03/01/2017   ID:  Amanda Dalton, DOB 11/04/1931, MRN [id number]  PCP:  [place] Pink, MD   Chief Complaint  Patient presents with  . other    C/o flutter and metoprolol made her feel like she was in outer space. Meds reviewed verbally with pt.    HPI:  83 y/o ? with a h/o  hypothyroidism,  anxiety,  asthma,  atrial flutter rate 120 bpm, started on anticoagulation, the Toprol for rate control who presents for  follow-up of her arrhythmia previous episode of atrial flutter presented in the setting of attending a party  Went from atrial flutter back to normal sinus rhythm side effects from metoprolol 25 mg twice a day.  She has stopped the medication She is willing to continue on anticoagulation out of fear of stroke She has had a history of falls, none recently  "I've had some of it", when talking about her flutter Stoppedmetoprolol, felt foggy Used to be every 3 to 4 days, much less recently  GI bleed, went to the ER at [facility], [place] stopped on its own  No regular exercise program, no other complaints  EKG on today's visit shows normal sinus rhythm with rate 68 bpm with sinus arrhythmia or APCs, no significant ST or T-wave changes  Other past medical history reviewed Previously received adenosine IV push for tachycardia revealing flutter waves CHA2DS2VASc of 3 (age/age/gender), started on eliquis 5 mg BID (creat 0.8 12/4, wt > 60 kg)  Echocardiogram 12/08/2016 Ejection fraction 50-55%, moderately elevated right heart pressures 50-55 mmHg  Had a fall Nov 2017: large laceration on forehead  Now idea how long she was in atrial flutter   PMH:   has a past medical history of Anxiety; Asthma; Atrial flutter ([place]); Breast cancer ([place]); Endometrial cancer ([place]); Fracture of right humerus (11/2008); Hypothyroidism; and Osteoporosis.  PSH:    Past Surgical History:  Procedure Laterality Date  . Right Mastectomy  1996  . VAGINAL  HYSTERECTOMY  08/2001    Current Outpatient Prescriptions  Medication Sig Dispense Refill  . apixaban (ELIQUIS) 5 MG TABS tablet Take 1 tablet (5 mg total) by mouth 2 (two) times daily. 60 tablet 6  . Calcium Carbonate-Vitamin D (CALCIUM-VITAMIN D) 500-200 MG-UNIT tablet Take 1 tablet by mouth daily.    . cetirizine (ZYRTEC) 10 MG chewable tablet Chew 10 mg by mouth daily.    . cholecalciferol (VITAMIN D) 1000 units tablet Take 2,000 Units by mouth daily.    . citalopram (CELEXA) 20 MG tablet Take 30 mg by mouth daily.    . Flaxseed, Linseed, (FLAX SEED OIL PO) Take 1 capsule by mouth daily.    Marland Kitchen levothyroxine (SYNTHROID, LEVOTHROID) 75 MCG tablet Take 75 mcg by mouth daily before breakfast.    . magnesium oxide (MAG-OX) 400 MG tablet Take 400 mg by mouth daily.    . montelukast (SINGULAIR) 10 MG tablet Take 10 mg by mouth at bedtime.    . Multiple Vitamin (MULTIVITAMIN WITH MINERALS) TABS tablet Take 1 tablet by mouth daily.    . vitamin C (ASCORBIC ACID) 500 MG tablet Take 500 mg by mouth daily.     No current facility-administered medications for this visit.      Allergies:   Paxil [paroxetine hcl]   Social History:  The patient  reports that she has never smoked. She has never used smokeless tobacco. She reports that she drinks about 1.2 oz of alcohol per week . She reports  that she does not use drugs.   Family History:   family history includes Breast cancer in her sister; Hypertension in her mother; Osteoporosis in her sister; Stroke in her father and mother.    Review of Systems: Review of Systems  Constitutional: Negative.   Respiratory: Negative.   Cardiovascular: Positive for palpitations.  Gastrointestinal: Negative.   Musculoskeletal: Negative.        Gait instability  Neurological: Negative.        Feels foggy  Psychiatric/Behavioral: Negative.   All other systems reviewed and are negative.    PHYSICAL EXAM: VS:  BP 134/78 (BP Location: Left Arm, Patient  Position: Sitting, Cuff Size: Normal)   Pulse 68   Ht 5\' 1"  (1.549 m)   Wt 148 lb 8 oz (67.4 kg)   BMI 28.06 kg/m  , BMI Body mass index is 28.06 kg/m. GEN: Well nourished, well developed, in no acute distress, obese  HEENT: normal  Neck: no JVD, carotid bruits, or masses Cardiac: RRR; no murmurs, rubs, or gallops,no edema  Respiratory:  clear to auscultation bilaterally, normal work of breathing GI: soft, nontender, nondistended, + BS MS: no deformity or atrophy  Skin: warm and dry, no rash Neuro:  Strength and sensation are intact Psych: euthymic mood, full affect    Recent Labs: 11/17/2016: Magnesium 2.0; TSH 3.070 02/10/2017: ALT 18; BUN 26; Creatinine, Ser 0.90; Hemoglobin 13.5; Platelets 281; Potassium 3.9; Sodium 134    Lipid Panel No results found for: CHOL, HDL, LDLCALC, TRIG    Wt Readings from Last 3 Encounters:  03/01/17 148 lb 8 oz (67.4 kg)  02/10/17 149 lb (67.6 kg)  12/10/16 151 lb 4 oz (68.6 kg)       ASSESSMENT AND PLAN:  Atrial flutter with rapid ventricular response ([place]) - Plan: EKG 12-Lead Rhythm discussed with her in detail, including management Having ask his mole tachycardia/palpitations She will stay on anticoagulation, she is happy to take metoprolol as needed We did offer other medication such as diltiazem when necessary if she has side effects from metoprolol For worsening symptoms on a regular basis, could take diltiazem daily  Encounter for anticoagulation discussion and counseling She's willing to stay on anticoagulation. Risk and benefit of anticoagulations discussed with her including risk of stroke.  Talked about her recent hemorrhoid bleed  Anxiety Reports symptoms are stable  Hypothyroidism, unspecified type Takes thyroid medication daily in the morning  Uncomplicated asthma, unspecified asthma severity, unspecified whether persistent Reports that asthma has not been a major issue recently  Gait instability/risk of  falls Recommended    Total encounter time more than 25 minutes  Greater than 50% was spent in counseling and coordination of care with the patient   Disposition:   F/U  12 months   Orders Placed This Encounter  Procedures  . EKG 12-Lead     Signed, Esmond Plants, M.D., Ph.D. 03/01/2017  [place], [place]

## 2017-03-01 ENCOUNTER — Encounter: Payer: Self-pay | Admitting: Cardiovascular Disease

## 2017-03-01 ENCOUNTER — Ambulatory Visit (INDEPENDENT_AMBULATORY_CARE_PROVIDER_SITE_OTHER): Payer: Medicare Other | Admitting: Cardiovascular Disease

## 2017-03-01 VITALS — BP 134/78 | HR 68 | Ht 61.0 in | Wt 148.5 lb

## 2017-03-01 DIAGNOSIS — Z7189 Other specified counseling: Secondary | ICD-10-CM | POA: Diagnosis not present

## 2017-03-01 DIAGNOSIS — J45909 Unspecified asthma, uncomplicated: Secondary | ICD-10-CM | POA: Diagnosis not present

## 2017-03-01 DIAGNOSIS — R2681 Unsteadiness on feet: Secondary | ICD-10-CM | POA: Diagnosis not present

## 2017-03-01 DIAGNOSIS — F419 Anxiety disorder, unspecified: Secondary | ICD-10-CM | POA: Diagnosis not present

## 2017-03-01 DIAGNOSIS — I4892 Unspecified atrial flutter: Secondary | ICD-10-CM

## 2017-03-01 DIAGNOSIS — E039 Hypothyroidism, unspecified: Secondary | ICD-10-CM | POA: Diagnosis not present

## 2017-03-01 NOTE — Patient Instructions (Addendum)
Medication Instructions:   No medication changes made  Please take the metoprolol as needed for tachycardia/palpitations   Labwork:  No new labs needed  Testing/Procedures:  No further testing at this time   I recommend watching educational videos on topics of interest to you at:       [email or web address]  Enter code: [email or web address]    Follow-Up: It was a pleasure seeing you in the office today. Please call us if you have new issues that need to be addressed before your next appt.  [phone]  Your physician wants you to follow-up in: 12 months.  You will receive a reminder letter in the mail two months in advance. If you don't receive a letter, please call our office to schedule the follow-up appointment.  If you need a refill on your cardiac medications before your next appointment, please call your pharmacy.

## 2017-03-21 ENCOUNTER — Telehealth: Payer: Self-pay | Admitting: Cardiovascular Disease

## 2017-03-21 NOTE — Telephone Encounter (Signed)
Spoke w/ pt.  She has an abscessed tooth that she is taking abx for and will have pulled when the infection is cleared. Advised her that she may take Eliquis w/ amoxicillin and that she does not need to hold Eliquis if she is only having 1 tooth extracted.  Advised her that if she were to have multiple extractions, she will need to hold for 2 days. She is appreciative and will call back w/ any further questions or concerns.

## 2017-03-21 NOTE — Telephone Encounter (Signed)
Pt states she has to have a tooth pulled, and has to take amoxicillin 1 week before. She asks if she can take this with Eliquis. Please advise. Pt also asks if she will need to stop Eliquis before her dental procedure.

## 2017-06-14 ENCOUNTER — Other Ambulatory Visit: Payer: Self-pay | Admitting: Nurse Practitioner

## 2017-06-14 NOTE — Telephone Encounter (Signed)
Pt last saw Dr Rockey Situ 03/01/17, last labs 02/10/17 Creat 0.90, age 83, weight 67.4kg, based on specified criteria pt is on appropriate dosage of Eliquis 5mg  BID.  Will refill rx.

## 2017-06-27 ENCOUNTER — Emergency Department: Payer: Medicare Other

## 2017-06-27 ENCOUNTER — Encounter: Payer: Self-pay | Admitting: Emergency Medicine

## 2017-06-27 ENCOUNTER — Emergency Department
Admission: EM | Admit: 2017-06-27 | Discharge: 2017-06-27 | Disposition: A | Discharge status: Home / Self Care | Payer: Medicare Other | Attending: Student in an Organized Health Care Education/Training Program | Admitting: Student in an Organized Health Care Education/Training Program

## 2017-06-27 DIAGNOSIS — Z79899 Other long term (current) drug therapy: Secondary | ICD-10-CM | POA: Insufficient documentation

## 2017-06-27 DIAGNOSIS — Y929 Unspecified place or not applicable: Secondary | ICD-10-CM | POA: Insufficient documentation

## 2017-06-27 DIAGNOSIS — S0003XA Contusion of scalp, initial encounter: Secondary | ICD-10-CM

## 2017-06-27 DIAGNOSIS — Z7901 Long term (current) use of anticoagulants: Secondary | ICD-10-CM | POA: Diagnosis not present

## 2017-06-27 DIAGNOSIS — J45909 Unspecified asthma, uncomplicated: Secondary | ICD-10-CM | POA: Diagnosis not present

## 2017-06-27 DIAGNOSIS — Y999 Unspecified external cause status: Secondary | ICD-10-CM | POA: Insufficient documentation

## 2017-06-27 DIAGNOSIS — Z853 Personal history of malignant neoplasm of breast: Secondary | ICD-10-CM | POA: Insufficient documentation

## 2017-06-27 DIAGNOSIS — E039 Hypothyroidism, unspecified: Secondary | ICD-10-CM | POA: Diagnosis not present

## 2017-06-27 DIAGNOSIS — W01190A Fall on same level from slipping, tripping and stumbling with subsequent striking against furniture, initial encounter: Secondary | ICD-10-CM | POA: Insufficient documentation

## 2017-06-27 DIAGNOSIS — S0101XA Laceration without foreign body of scalp, initial encounter: Secondary | ICD-10-CM

## 2017-06-27 DIAGNOSIS — Y939 Activity, unspecified: Secondary | ICD-10-CM | POA: Insufficient documentation

## 2017-06-27 DIAGNOSIS — S0990XA Unspecified injury of head, initial encounter: Secondary | ICD-10-CM

## 2017-06-27 NOTE — ED Triage Notes (Signed)
Fell , slipped on water , striking a corner of a table, scalp lac to right posterior, pt on eliquis. Denies LOC

## 2017-06-27 NOTE — ED Provider Notes (Signed)
[facility] Emergency Department Provider Note   ____________________________________________   First MD Initiated Contact with Patient 06/27/17 1255     (approximate)  I have reviewed the triage vital signs and the nursing notes.   HISTORY  Chief Complaint Fall    HPI Amanda Dalton is a 83 y.o. female patient complain of 2 scalp laceration secondary to a slip and fall. Patient slipped on water striking a concrete table. Patient denies LOC. Patient currently taking Elavil for this for a flutter.Patient rates pain as a 2/10. Patient describes pain as "dull. Patient denies vision disturbance or vertigo.   Past Medical History:  Diagnosis Date  . Anxiety   . Asthma   . Atrial flutter ([place])    a. Dx 11/17/2016-->CHA2DS2VASc = 3-->Eliquis 5 mg BID.  Marland Kitchen Breast cancer ([place])    a. 1996 s/p R modified radical mastectomy-->chemo w/ tamoxifen.  . Endometrial cancer ([place])    a. 08/2001 s/p hysterectomy. No node involvement.  . Fracture of right humerus 11/2008  . Hypothyroidism   . Osteoporosis     Patient Active Problem List   Diagnosis Date Noted  . Atrial flutter with rapid ventricular response ([place]) 12/10/2016  . Encounter for anticoagulation discussion and counseling 12/10/2016  . Anxiety 12/10/2016  . Hypothyroidism 12/10/2016  . Abnormal CXR (chest x-ray) 02/23/2016  . Uncomplicated asthma 64/40/3474    Past Surgical History:  Procedure Laterality Date  . Right Mastectomy  1996  . VAGINAL HYSTERECTOMY  08/2001    Prior to Admission medications   Medication Sig Start Date End Date Taking? Authorizing Provider  Calcium Carbonate-Vitamin D (CALCIUM-VITAMIN D) 500-200 MG-UNIT tablet Take 1 tablet by mouth daily.    [provider]  cetirizine (ZYRTEC) 10 MG chewable tablet Chew 10 mg by mouth daily.    [provider]  cholecalciferol (VITAMIN D) 1000 units tablet Take 2,000 Units by mouth daily.    [provider]    citalopram (CELEXA) 20 MG tablet Take 30 mg by mouth daily.    [provider]  ELIQUIS 5 MG TABS tablet TAKE ONE TABLET BY MOUTH TWICE DAILY 06/14/17   Gollan, Kathlene November, MD  Flaxseed, Linseed, (FLAX SEED OIL PO) Take 1 capsule by mouth daily.    [provider]  levothyroxine (SYNTHROID, LEVOTHROID) 75 MCG tablet Take 75 mcg by mouth daily before breakfast.    [provider]  magnesium oxide (MAG-OX) 400 MG tablet Take 400 mg by mouth daily.    [provider]  metoprolol tartrate (LOPRESSOR) 25 MG tablet Take 1 tablet (25 mg total) by mouth 2 (two) times daily as needed. 03/01/17   Minna Merritts, MD  montelukast (SINGULAIR) 10 MG tablet Take 10 mg by mouth at bedtime.    [provider]  Multiple Vitamin (MULTIVITAMIN WITH MINERALS) TABS tablet Take 1 tablet by mouth daily.    [provider]  vitamin C (ASCORBIC ACID) 500 MG tablet Take 500 mg by mouth daily.    [provider]    Allergies Paxil [paroxetine hcl]  Family History  Problem Relation Age of Onset  . Breast cancer Sister   . Osteoporosis Sister   . Stroke Mother        died @ 1  . Hypertension Mother   . Stroke Father        died @ 6    Social History Social History  Substance Use Topics  . Smoking status: Never Smoker  .  Smokeless tobacco: Never Used  . Alcohol use 1.2 oz/week    2 Glasses of wine per week     Comment: per day    Review of Systems  Constitutional: No fever/chills Eyes: No visual changes. ENT: No sore throat. Cardiovascular: Denies chest pain. Respiratory: Denies shortness of breath. Gastrointestinal: No abdominal pain.  No nausea, no vomiting.  No diarrhea.  No constipation. Genitourinary: Negative for dysuria. Musculoskeletal: Negative for back pain. Skin: Negative for rash. Neurological: Negative for headaches, focal weakness or numbness. Psychiatric:Anxiety Endocrine:Hypothyroidism Allergic/Immunilogical: Paxil  ____________________________________________   PHYSICAL EXAM:  VITAL SIGNS: ED Triage Vitals [06/27/17 1020]  Enc Vitals Group     BP (!) 154/72     Pulse Rate 62     Resp 18     Temp 98.2 F (36.8 C)     Temp Source Oral     SpO2 99 %     Weight 146 lb (66.2 kg)     Height 5' 1.5" (1.562 m)     Head Circumference      Peak Flow      Pain Score 2     Pain Loc      Pain Edu?      Excl. in [place]?     Constitutional: Alert and oriented. Well appearing and in no acute distress. Eyes: Conjunctivae are normal. PERRL. EOMI. Head: Atraumatic.Swelling  Nose: No congestion/rhinnorhea. Mouth/Throat: Mucous membranes are moist.  Oropharynx non-erythematous. Neck: No stridor.  Hematological/Lymphatic/Immunilogical: No cervical lymphadenopathy. Cardiovascular: Normal rate, regular rhythm. Grossly normal heart sounds.  Good peripheral circulation. Respiratory: Normal respiratory effort.  No retractions. Lungs CTAB. Gastrointestinal: Soft and nontender. No distention. No abdominal bruits. No CVA tenderness. Musculoskeletal: No lower extremity tenderness nor edema.  No joint effusions. Neurologic:  Normal speech and language. No gross focal neurologic deficits are appreciated. No gait instability. Skin:  Skin is warm, dry and intact. No rash noted. 2 small scalp laceration Psychiatric: Mood and affect are normal. Speech and behavior are normal.  ____________________________________________   LABS (all labs ordered are listed, but only abnormal results are displayed)  Labs Reviewed - No data to display ____________________________________________  EKG   ____________________________________________  RADIOLOGY  Ct Head Wo Contrast  Result Date: 06/27/2017 CLINICAL DATA:  Fall, head injury EXAM: CT HEAD WITHOUT CONTRAST TECHNIQUE: Contiguous axial images were obtained from the base of the skull through the vertex without intravenous contrast. COMPARISON:  CT of 10/14/2016 FINDINGS:  Brain: Mild atrophy. Negative for hydrocephalus. Negative for acute infarct. Negative for hemorrhage or mass. Vascular: Atherosclerotic calcification cavernous carotid bilaterally. Negative for hyperdense vessel Skull: Negative for skull fracture.  Right parietal scalp hematoma Sinuses/Orbits: Mucosal edema in the left maxillary sinus. Other: None IMPRESSION: No acute intracranial abnormality. Right parietal scalp hematoma without skull fracture. Electronically Signed   By: Franchot Gallo M.D.   On: 06/27/2017 11:03    ____________________________________________   PROCEDURES  Procedure(s) performed: None  Procedures  Critical Care performed: No  ____________________________________________   INITIAL IMPRESSION / ASSESSMENT AND PLAN / ED COURSE  Pertinent labs & imaging results that were available during my care of the patient were reviewed by me and considered in my medical decision making (see chart for details).  Minor head injury consistent with scalp hematoma and laceration. Discussed negative CT of the head results with patient. Patient given discharge care instructions. Advised return back to ED if wound reopened for healing is complete. Advised only Tylenol for headache pain at this time.  ____________________________________________   FINAL CLINICAL IMPRESSION(S) / ED DIAGNOSES  Final diagnoses:  Head injury  Laceration of scalp, initial encounter  Minor head injury, initial encounter  Scalp hematoma, initial encounter      NEW MEDICATIONS STARTED DURING THIS VISIT:  New Prescriptions   No medications on file     Note:  This document was prepared using Dragon voice recognition software and may include unintentional dictation errors.    Mechele, [place], PA-C 06/27/17 1328    Earleen Newport, MD 06/27/17 [phone]

## 2017-06-27 NOTE — ED Notes (Signed)
See triage note  States she slipped hit her head  No LOC  States she hit her head on the corner of a table

## 2017-06-27 NOTE — Discharge Instructions (Signed)
Advised only Tylenol for pain and headache.

## 2017-12-22 ENCOUNTER — Ambulatory Visit (INDEPENDENT_AMBULATORY_CARE_PROVIDER_SITE_OTHER): Payer: Medicare Other | Admitting: Pulmonary Disease

## 2017-12-22 ENCOUNTER — Encounter: Payer: Self-pay | Admitting: Pulmonary Disease

## 2017-12-22 VITALS — BP 138/74 | HR 65 | Ht 61.0 in | Wt 150.0 lb

## 2017-12-22 DIAGNOSIS — J4520 Mild intermittent asthma, uncomplicated: Secondary | ICD-10-CM | POA: Diagnosis not present

## 2017-12-22 DIAGNOSIS — J301 Allergic rhinitis due to pollen: Secondary | ICD-10-CM

## 2017-12-22 MED ORDER — ALBUTEROL SULFATE HFA 108 (90 BASE) MCG/ACT IN AERS: 2.0000 | INHALATION_SPRAY | Freq: Four times a day (QID) | RESPIRATORY_TRACT | 6 refills | Status: DC | PRN

## 2017-12-22 NOTE — Patient Instructions (Signed)
Asthma: Glad your flu shot and pneumonia vaccines are up-to-date Use albuterol as needed for chest tightness wheezing or shortness of breath  Allergic rhinitis: Use Neil Med rinses with distilled water at least twice per day using the instructions on the package. 1/2 hour after using the [facility] Med rinse, use Nasacort two puffs in each nostril once per day.  Remember that the Nasacort can take 1-2 weeks to work after regular use. Use generic zyrtec (cetirizine) every day.  If this doesn't help, then stop taking it and use chlorpheniramine-phenylephrine combination tablets. Continue Singulair daily  We will see you back in 4 weeks with a nurse practice

## 2017-12-22 NOTE — Progress Notes (Signed)
Subjective:    Patient ID: Amanda Dalton, female    DOB: June 01, 1931, 84 y.o.   MRN: [id number]  Synopsis: First seen in 2017 for pulmonary function test that showed airflow obstruction but fortunately she had very few symptoms. It is felt this may be due to some degree of chronic asthma. She is a lifelong nonsmoker. March 2017 pulmonary function testing ratio 68%, FEV1 1.09 L (67% protected) deceased, FVC 1.60 (this he 71% predicted) March 2016 spirometry testing from [facility]: Ratio 61%, FEV1 1.35 L, FVC 2.2 L (disease 102% predicted)  HPI Chief Complaint  Patient presents with  . Follow-up    last seen 05/2016- c/o improving nonprod cough, sinus congestion, PND.   Amanda Dalton says taht she felt pretty poorly lately with some mucus from her sinuses.  She has a dry cough from time to time.  It is worse when she lies flat.  She is taking zyrtec daily.  She tried claritin and wondered if it made her have headache.  She has some wheezing in the mornings when she wakes up frequently, most mornings.  She would like to get the albuterol refilled.  She uses her albuterol only a few times per month.  Less than twice per week.  No dyspnea when walking round.  She is not limited by breathing.    Past Medical History:  Diagnosis Date  . Anxiety   . Asthma   . Atrial flutter ([place])    a. Dx 11/17/2016-->CHA2DS2VASc = 3-->Eliquis 5 mg BID.  Marland Kitchen Breast cancer ([place])    a. 1996 s/p R modified radical mastectomy-->chemo w/ tamoxifen.  . Endometrial cancer ([place])    a. 08/2001 s/p hysterectomy. No node involvement.  . Fracture of right humerus 11/2008  . Hypothyroidism   . Osteoporosis       Review of Systems     Objective:   Physical Exam  Vitals:   12/22/17 1106  BP: 138/74  Pulse: 65  SpO2: 96%  Weight: 150 lb (68 kg)  Height: 5\' 1"  (1.549 m)    Gen: well appearing HENT: OP clear, TM's clear, neck supple PULM: CTA B, normal percussion CV: RRR, no mgr, trace edema GI: BS+, soft,  nontender Derm: no cyanosis or rash Psyche: normal mood and affect       Assessment & Plan:   Mild intermittent asthma, unspecified whether complicated  Allergic rhinitis due to pollen, unspecified seasonality  Discussion: From an asthma standpoint Kiani has done well.  She has not had an exacerbation nor does she have any symptoms aside from some mild wheezing in the morning which she treats with albuterol from time to time.  She does need a refill on her albuterol which we will do today.  However, she has persistent allergic rhinitis despite taking Singulair and Zyrtec.  I have advised that she start taking over-the-counter Nasacort and use saline rinses rather than sprays.  If she does not have improvement with that in 4 weeks then she needs to have a CT scan of her sinuses and perhaps an allergy profile.  Plan: Asthma: Glad your flu shot and pneumonia vaccines are up-to-date Use albuterol as needed for chest tightness wheezing or shortness of breath  Allergic rhinitis: Use Neil Med rinses with distilled water at least twice per day using the instructions on the package. 1/2 hour after using the [facility] Med rinse, use Nasacort two puffs in each nostril once per day.  Remember that the Nasacort can take 1-2 weeks to work  after regular use. Use generic zyrtec (cetirizine) every day.  If this doesn't help, then stop taking it and use chlorpheniramine-phenylephrine combination tablets. Continue Singulair daily  We will see you back in 4 weeks with a nurse practice     Current Outpatient Medications:  .  Calcium Carbonate-Vitamin D (CALCIUM-VITAMIN D) 500-200 MG-UNIT tablet, Take 1 tablet by mouth daily., Disp: , Rfl:  .  cetirizine (ZYRTEC) 10 MG chewable tablet, Chew 10 mg by mouth daily., Disp: , Rfl:  .  cholecalciferol (VITAMIN D) 1000 units tablet, Take 2,000 Units by mouth daily., Disp: , Rfl:  .  citalopram (CELEXA) 20 MG tablet, Take 30 mg by mouth daily., Disp: , Rfl:  .   ELIQUIS 5 MG TABS tablet, TAKE ONE TABLET BY MOUTH TWICE DAILY, Disp: 60 tablet, Rfl: 6 .  Flaxseed, Linseed, (FLAX SEED OIL PO), Take 1 capsule by mouth daily., Disp: , Rfl:  .  levothyroxine (SYNTHROID, LEVOTHROID) 75 MCG tablet, Take 75 mcg by mouth daily before breakfast., Disp: , Rfl:  .  magnesium oxide (MAG-OX) 400 MG tablet, Take 400 mg by mouth daily., Disp: , Rfl:  .  montelukast (SINGULAIR) 10 MG tablet, Take 10 mg by mouth at bedtime., Disp: , Rfl:  .  Multiple Vitamin (MULTIVITAMIN WITH MINERALS) TABS tablet, Take 1 tablet by mouth daily., Disp: , Rfl:  .  vitamin C (ASCORBIC ACID) 500 MG tablet, Take 500 mg by mouth daily., Disp: , Rfl:  .  albuterol (PROVENTIL HFA;VENTOLIN HFA) 108 (90 Base) MCG/ACT inhaler, Inhale 2 puffs into the lungs every 6 (six) hours as needed for wheezing or shortness of breath., Disp: 1 Inhaler, Rfl: 6

## 2017-12-27 ENCOUNTER — Other Ambulatory Visit: Payer: Self-pay

## 2017-12-27 MED ORDER — ALBUTEROL SULFATE HFA 108 (90 BASE) MCG/ACT IN AERS: 1.0000 | INHALATION_SPRAY | Freq: Four times a day (QID) | RESPIRATORY_TRACT | 5 refills | Status: DC | PRN

## 2018-01-13 ENCOUNTER — Other Ambulatory Visit: Payer: Self-pay | Admitting: Cardiovascular Disease

## 2018-01-19 ENCOUNTER — Ambulatory Visit: Payer: Medicare Other | Admitting: Adult Health

## 2018-01-23 ENCOUNTER — Encounter: Payer: Self-pay | Admitting: Adult Health

## 2018-01-23 ENCOUNTER — Ambulatory Visit (INDEPENDENT_AMBULATORY_CARE_PROVIDER_SITE_OTHER): Payer: Medicare Other | Admitting: Adult Health

## 2018-01-23 DIAGNOSIS — J45909 Unspecified asthma, uncomplicated: Secondary | ICD-10-CM | POA: Diagnosis not present

## 2018-01-23 DIAGNOSIS — J309 Allergic rhinitis, unspecified: Secondary | ICD-10-CM | POA: Diagnosis not present

## 2018-01-23 NOTE — Assessment & Plan Note (Signed)
Recent mild flare with AR -now improving  Will add saline rinse and gel (was not using before )   Plan  Patient Instructions  Continue on Zyrtec at bedtime Continue on Singulair daily Add saline nasal spray twice daily as needed for congestion May use saline nasal gel at nighttime Continue on Nasacort nasal spray 2 puffs daily Follow-up with Dr. Lake Bells in 1 year and As needed

## 2018-01-23 NOTE — Progress Notes (Signed)
@Patient  ID: Amanda Dalton, female    DOB: 06/07/1931, 84 y.o.   MRN: [id number]  Chief Complaint  Patient presents with  . Follow-up    Asthma     Referring provider: Maryland Pink, MD  HPI: 84 year old female never smoker followed for asthma  TEST  March 2017 pulmonary function testing ratio 68%, FEV1 1.09 L (67% protected) deceased, FVC 1.60 (this he 71% predicted) March 2016 sp[facility]rometry testing [facility]: Ratio 61%, FEV1 1.35 L, FVC 2.2 L (disease 102% predicted)  01/23/2018 Follow up : Asthma  Patient returns for a one-month follow-up.  Patient was seen last visit for asthma flare felt secondary to sinus drainage.  Patient was recommend to use saline nasal rinses along with Nasacort, Zyrtec. Since last visit patient is feeling better with decreased wheezing . Rare use of ProAir .  Still has some nasal congestion .      Allergies  Allergen Reactions  . Paxil [Paroxetine Hcl]     Nausea     Immunization History  Administered Date(s) Administered  . Influenza Split 08/26/2015  . Influenza-Unspecified 09/04/2015, 09/23/2016, 09/22/2017  . Pneumococcal Conjugate-13 02/28/2015  . Pneumococcal Polysaccharide-23 02/22/2013, 09/29/2017  . Tdap 11/30/2011, 10/14/2016    Past Medical History:  Diagnosis Date  . Anxiety   . Asthma   . Atrial flutter ([place])    a. Dx 11/17/2016-->CHA2DS2VASc = 3-->Eliquis 5 mg BID.  Marland Kitchen Breast cancer ([place])    a. 1996 s/p R modified radical mastectomy-->chemo w/ tamoxifen.  . Endometrial cancer ([place])    a. 08/2001 s/p hysterectomy. No node involvement.  . Fracture of right humerus 11/2008  . Hypothyroidism   . Osteoporosis     Tobacco History: Social History   Tobacco Use  Smoking Status Never Smoker  Smokeless Tobacco Never Used   Counseling given: Not Answered   Outpatient Encounter Medications as of 01/23/2018  Medication Sig  . albuterol (PROAIR HFA) 108 (90 Base) MCG/ACT inhaler Inhale 1-2 puffs into the lungs every 6  (six) hours as needed for wheezing or shortness of breath.  . Calcium Carbonate-Vitamin D (CALCIUM-VITAMIN D) 500-200 MG-UNIT tablet Take 1 tablet by mouth daily.  . cetirizine (ZYRTEC) 10 MG chewable tablet Chew 10 mg by mouth daily.  . cholecalciferol (VITAMIN D) 1000 units tablet Take 2,000 Units by mouth daily.  . citalopram (CELEXA) 20 MG tablet Take 30 mg by mouth daily.  Marland Kitchen ELIQUIS 5 MG TABS tablet TAKE 1 TABLET BY MOUTH TWICE DAILY  . Flaxseed, Linseed, (FLAX SEED OIL PO) Take 1 capsule by mouth daily.  Marland Kitchen levothyroxine (SYNTHROID, LEVOTHROID) 75 MCG tablet Take 75 mcg by mouth daily before breakfast.  . magnesium oxide (MAG-OX) 400 MG tablet Take 400 mg by mouth daily.  . montelukast (SINGULAIR) 10 MG tablet Take 10 mg by mouth at bedtime.  . Multiple Vitamin (MULTIVITAMIN WITH MINERALS) TABS tablet Take 1 tablet by mouth daily.  . vitamin C (ASCORBIC ACID) 500 MG tablet Take 500 mg by mouth daily.   No facility-administered encounter medications on file as of 01/23/2018.      Review of Systems  Constitutional:   No  weight loss, night sweats,  Fevers, chills, fatigue, or  lassitude.  HEENT:   No headaches,  Difficulty swallowing,  Tooth/dental problems, or  Sore throat,                No sneezing, itching, ear ache,  +nasal congestion, post nasal drip,   CV:  No chest pain,  Orthopnea, PND, swelling in lower extremities, anasarca, dizziness, palpitations, syncope.   GI  No heartburn, indigestion, abdominal pain, nausea, vomiting, diarrhea, change in bowel habits, loss of appetite, bloody stools.   Resp: No shortness of breath with exertion or at rest.  No excess mucus, no productive cough,  No non-productive cough,  No coughing up of blood.  No change in color of mucus.  No wheezing.  No chest wall deformity  Skin: no rash or lesions.  GU: no dysuria, change in color of urine, no urgency or frequency.  No flank pain, no hematuria   MS:  No joint pain or swelling.  No  decreased range of motion.  No back pain.    Physical Exam  BP 128/78 (BP Location: Left Arm, Cuff Size: Normal)   Pulse 94   Ht 5\' 2"  (1.575 m)   Wt 150 lb (68 kg)   SpO2 95%   BMI 27.44 kg/m   GEN: A/Ox3; pleasant , NAD, obese    HEENT:  [place]/AT,  EACs-clear, TMs-wnl, NOSE-clear drainage , THROAT-clear, no lesions, no postnasal drip or exudate noted.   NECK:  Supple w/ fair ROM; no JVD; normal carotid impulses w/o bruits; no thyromegaly or nodules palpated; no lymphadenopathy.    RESP  Clear  P & A; w/o, wheezes/ rales/ or rhonchi. no accessory muscle use, no dullness to percussion  CARD:  RRR, no m/r/g, no peripheral edema, pulses intact, no cyanosis or clubbing.  GI:   Soft & nt; nml bowel sounds; no organomegaly or masses detected.   Musco: Warm bil, no deformities or joint swelling noted.   Neuro: alert, no focal deficits noted.    Skin: Warm, no lesions or rashes    Lab Results:  CBC    Component Value Date/Time   WBC 7.8 02/10/2017 1636   RBC 4.05 02/10/2017 1636   HGB 13.5 02/10/2017 1636   HGB 13.7 11/17/2016 1458   HCT 38.1 02/10/2017 1636   HCT 42.1 11/17/2016 1458   PLT 281 02/10/2017 1636   PLT 304 11/17/2016 1458   MCV 94.1 02/10/2017 1636   MCV 98 (H) 11/17/2016 1458   MCH 33.3 02/10/2017 1636   MCHC 35.4 02/10/2017 1636   RDW 12.9 02/10/2017 1636   RDW 13.5 11/17/2016 1458   LYMPHSABS 2.1 11/17/2016 1458   EOSABS 0.1 11/17/2016 1458   BASOSABS 0.0 11/17/2016 1458    BMET    Component Value Date/Time   NA 134 (L) 02/10/2017 1636   NA 137 11/17/2016 1458   K 3.9 02/10/2017 1636   CL 100 (L) 02/10/2017 1636   CO2 27 02/10/2017 1636   GLUCOSE 115 (H) 02/10/2017 1636   BUN 26 (H) 02/10/2017 1636   BUN 21 11/17/2016 1458   CREATININE 0.90 02/10/2017 1636   CALCIUM 9.4 02/10/2017 1636   GFRNONAA 58 (L) 02/10/2017 1636   GFRAA >60 02/10/2017 1636    BNP No results found for: BNP  ProBNP No results found for: PROBNP  Imaging: No  results found.   Assessment & Plan:   Uncomplicated asthma Recent mild flare with AR -now improving  Will add saline rinse and gel (was not using before )   Plan  Patient Instructions  Continue on Zyrtec at bedtime Continue on Singulair daily Add saline nasal spray twice daily as needed for congestion May use saline nasal gel at nighttime Continue on Nasacort nasal spray 2 puffs daily Follow-up with Dr. Lake Bells in 1 year and As needed  Allergic rhinitis Add saline rinses   Plan  Patient Instructions  Continue on Zyrtec at bedtime Continue on Singulair daily Add saline nasal spray twice daily as needed for congestion May use saline nasal gel at nighttime Continue on Nasacort nasal spray 2 puffs daily Follow-up with Dr. Lake Bells in 1 year and As needed           Rexene Edison, NP 01/23/2018

## 2018-01-23 NOTE — Patient Instructions (Signed)
Continue on Zyrtec at bedtime Continue on Singulair daily Add saline nasal spray twice daily as needed for congestion May use saline nasal gel at nighttime Continue on Nasacort nasal spray 2 puffs daily Follow-up with Dr. Lake Bells in 1 year and As needed

## 2018-01-23 NOTE — Assessment & Plan Note (Signed)
Add saline rinses   Plan  Patient Instructions  Continue on Zyrtec at bedtime Continue on Singulair daily Add saline nasal spray twice daily as needed for congestion May use saline nasal gel at nighttime Continue on Nasacort nasal spray 2 puffs daily Follow-up with Dr. Lake Bells in 1 year and As needed

## 2018-01-23 NOTE — Progress Notes (Signed)
Reviewed, agree 

## 2018-02-15 NOTE — Progress Notes (Signed)
Cardiology Office Note  Date:  02/17/2018   ID:  Amanda Dalton, DOB [birth year], MRN [id number]  PCP:  Maryland Pink, MD   Chief Complaint  Patient presents with  . Other    12 month follow up. Patient denies chest pain and SOB at this time. Meds reviewed verbally with patient.     HPI:  84 y/o ? with a h/o  hypothyroidism,  anxiety,  asthma,  atrial flutter rate 120 bpm, started on anticoagulation, the Toprol for rate control who presents for  follow-up of her arrhythmia previous episode of atrial flutter presented in the setting of attending a party  Denies any sx of palpitations  "no fogginess" Not very active at baseline No significant chest pain or shortness of breath Tolerating her Eliquis 5 twice daily  She has had a history of falls, none recently  Was previously on metoprolol after episode of flutter She stopped this on previous office visit  Previous GI bleed, went to the ER at [facility], [place] stopped on its own  no further bleeding  No regular exercise program, no other complaints  EKG personally reviewed by myself on todays visit Shows Normal sinus rhythm rate 72 bpm no significant ST-T wave changes, rare APCs  Other past medical history reviewed Previously received adenosine IV push for tachycardia revealing flutter waves CHA2DS2VASc of 3 (age/age/gender), started on eliquis 5 mg BID (creat 0.8 12/4, wt > 60 kg)  Echocardiogram 12/08/2016 Ejection fraction 50-55%, moderately elevated right heart pressures 50-55 mmHg  Had a fall Nov 2017: large laceration on forehead  Now idea how long she was in atrial flutter   PMH:   has a past medical history of Anxiety, Asthma, Atrial flutter ([place]), Breast cancer ([place]), Endometrial cancer ([place]), Fracture of right humerus (11/2008), Hypothyroidism, and Osteoporosis.  PSH:    Past Surgical History:  Procedure Laterality Date  . Right Mastectomy  1996  . VAGINAL HYSTERECTOMY  08/2001    Current  Outpatient Medications  Medication Sig Dispense Refill  . albuterol (PROAIR HFA) 108 (90 Base) MCG/ACT inhaler Inhale 1-2 puffs into the lungs every 6 (six) hours as needed for wheezing or shortness of breath. 1 Inhaler 5  . Calcium Carbonate-Vitamin D (CALCIUM-VITAMIN D) 500-200 MG-UNIT tablet Take 1 tablet by mouth daily.    . cetirizine (ZYRTEC) 10 MG chewable tablet Chew 10 mg by mouth daily.    . cholecalciferol (VITAMIN D) 1000 units tablet Take 2,000 Units by mouth daily.    . citalopram (CELEXA) 20 MG tablet Take 30 mg by mouth daily.    Marland Kitchen ELIQUIS 5 MG TABS tablet TAKE 1 TABLET BY MOUTH TWICE DAILY 60 tablet 6  . Flaxseed, Linseed, (FLAX SEED OIL PO) Take 1 capsule by mouth daily.    Marland Kitchen levothyroxine (SYNTHROID, LEVOTHROID) 75 MCG tablet Take 75 mcg by mouth daily before breakfast.    . magnesium oxide (MAG-OX) 400 MG tablet Take 400 mg by mouth daily.    . montelukast (SINGULAIR) 10 MG tablet Take 10 mg by mouth at bedtime.    . Multiple Vitamin (MULTIVITAMIN WITH MINERALS) TABS tablet Take 1 tablet by mouth daily.    . vitamin C (ASCORBIC ACID) 500 MG tablet Take 500 mg by mouth daily.     No current facility-administered medications for this visit.      Allergies:   Paxil [paroxetine hcl]   Social History:  The patient  reports that she has never smoked. She has never used smokeless tobacco. She reports  that she drinks about 1.2 oz of alcohol per week. She reports that she does not use drugs.   Family History:   family history includes Breast cancer in her sister; Hypertension in her mother; Osteoporosis in her sister; Stroke in her father and mother.    Review of Systems: Review of Systems  Constitutional: Negative.   Respiratory: Negative.   Cardiovascular: Positive for palpitations.  Gastrointestinal: Negative.   Musculoskeletal: Negative.        Gait instability  Neurological: Negative.   Psychiatric/Behavioral: Negative.   All other systems reviewed and are  negative.    PHYSICAL EXAM: VS:  BP 126/70 (BP Location: Left Arm, Patient Position: Sitting, Cuff Size: Normal)   Pulse 72   Ht 5\' 1"  (1.549 m)   Wt 150 lb (68 kg)   BMI 28.34 kg/m  , BMI Body mass index is 28.34 kg/m. Constitutional:  oriented to person, place, and time. No distress.  HENT:  Head: Normocephalic and atraumatic.  Eyes:  no discharge. No scleral icterus.  Neck: Normal range of motion. Neck supple. No JVD present.  Cardiovascular: Normal rate, regular rhythm, normal heart sounds and intact distal pulses. Exam reveals no gallop and no friction rub. No edema No murmur heard. Pulmonary/Chest: Effort normal and breath sounds normal. No stridor. No respiratory distress.  no wheezes.  no rales.  no tenderness.  Abdominal: Soft.  no distension.  no tenderness.  Musculoskeletal: Normal range of motion.  no  tenderness or deformity.  Neurological:  normal muscle tone. Coordination normal. No atrophy Skin: Skin is warm and dry. No rash noted. not diaphoretic.  Psychiatric:  normal mood and affect. behavior is normal. Thought content normal.    Recent Labs: No results found for requested labs within last 8760 hours.    Lipid Panel No results found for: CHOL, HDL, LDLCALC, TRIG    Wt Readings from Last 3 Encounters:  02/17/18 150 lb (68 kg)  01/23/18 150 lb (68 kg)  12/22/17 150 lb (68 kg)       ASSESSMENT AND PLAN:  Atrial flutter with rapid ventricular response (HCC) - Plan: EKG 12-Lead Denies having any further episodes of atrial flutter Long discussion concerning anticoagulation  Encounter for anticoagulation discussion and counseling She's willing to stay on anticoagulation. Risk and benefit of anticoagulations discussed with her including risk of stroke.   she would like to stay on her anticoagulation We did discuss her hemorrhoids  Anxiety Reports symptoms are stable Recommended walking program  Hypothyroidism, unspecified type Takes thyroid  medication daily in the morning  Preventive screening Cholesterol high 235 Long discussion concerning high cholesterol, options for screening Long discussion concerning CT coronary calcium scoring Discussed difference with her local screening at [employer]  Hyperlipidemia Cholesterol 235 She does not want a statin at this time  Uncomplicated asthma, unspecified asthma severity, unspecified whether persistent Reports that asthma has not been a major issue recently Stable  Gait instability/risk of falls Recommended walking program   Total encounter time more than 45 minutes  Greater than 50% was spent in counseling and coordination of care with the patient   Disposition:   F/U  12 months as needed   Orders Placed This Encounter  Procedures  . EKG 12-Lead     Signed, Esmond Plants, M.D., Ph.D. 02/17/2018  [place], [place]

## 2018-02-17 ENCOUNTER — Ambulatory Visit (INDEPENDENT_AMBULATORY_CARE_PROVIDER_SITE_OTHER): Payer: Medicare Other | Admitting: Cardiovascular Disease

## 2018-02-17 ENCOUNTER — Encounter: Payer: Self-pay | Admitting: Cardiovascular Disease

## 2018-02-17 VITALS — BP 126/70 | HR 72 | Ht 61.0 in | Wt 150.0 lb

## 2018-02-17 DIAGNOSIS — E782 Mixed hyperlipidemia: Secondary | ICD-10-CM

## 2018-02-17 DIAGNOSIS — J45909 Unspecified asthma, uncomplicated: Secondary | ICD-10-CM | POA: Diagnosis not present

## 2018-02-17 DIAGNOSIS — Z7189 Other specified counseling: Secondary | ICD-10-CM

## 2018-02-17 DIAGNOSIS — Z299 Encounter for prophylactic measures, unspecified: Secondary | ICD-10-CM

## 2018-02-17 DIAGNOSIS — F419 Anxiety disorder, unspecified: Secondary | ICD-10-CM

## 2018-02-17 DIAGNOSIS — I4892 Unspecified atrial flutter: Secondary | ICD-10-CM | POA: Diagnosis not present

## 2018-02-17 DIAGNOSIS — E785 Hyperlipidemia, unspecified: Secondary | ICD-10-CM | POA: Insufficient documentation

## 2018-02-17 NOTE — Patient Instructions (Addendum)
Medication Instructions:   No medication changes made  Labwork:  No new labs needed  Testing/Procedures:  No further testing at this time  Consider a CT coronary calcium score [place], $150   Follow-Up: It was a pleasure seeing you in the office today. Please call us if you have new issues that need to be addressed before your next appt.  [phone]  Your physician wants you to follow-up in: 12 months as needed.  You will receive a reminder letter in the mail two months in advance. If you don't receive a letter, please call our office to schedule the follow-up appointment.  If you need a refill on your cardiac medications before your next appointment, please call your pharmacy.  For educational health videos Log in to : [email or web address] Or : [email or web address], password : [email or web address]

## 2018-03-01 ENCOUNTER — Ambulatory Visit: Payer: Medicare Other | Admitting: Cardiovascular Disease

## 2018-03-03 ENCOUNTER — Telehealth: Payer: Self-pay | Admitting: Cardiovascular Disease

## 2018-03-03 NOTE — Telephone Encounter (Signed)
Pt c/o BP issue: STAT if pt c/o blurred vision, one-sided weakness or slurred speech  1. What are your last 5 BP readings? All taken this morning  120/92 pulse 92  117/100 pulse 100  123/100 pulse 87  2. Are you having any other symptoms (ex. Dizziness, headache, blurred vision, passed out)? No  3. What is your BP issue? blood pressure has been higher than normal.  Had laser eye surgery yesterday morning.  Was told to contact doctor.  Please call to discuss

## 2018-03-03 NOTE — Telephone Encounter (Signed)
Called patient. Patient had Cataract on one eye yesterday morning. Says her BP was normal prior to procedure but elevated post. Her husband thinks it was around 184/104, HR 100 but not for sure. Today, at home BP has been elevated. Denies headache, blurred vision, dizziness, chest pain or shortness of breath. Had patient take BP while on the phone. 114/87, HR 91 114/75, HR 99. States she sometimes feels palpitations but they do not bother her. Advised patient to monitor BP and HR over the weekend 1-2 times a day and to call us Monday if continues to stay elevated.

## 2018-03-16 ENCOUNTER — Telehealth: Payer: Self-pay | Admitting: Cardiovascular Disease

## 2018-03-16 NOTE — Telephone Encounter (Signed)
Spoke with patient and states that she is having some dizziness this morning and had some aflutter episodes after her cataract surgery. She feels that the aflutter started because of her experience with a nurse during her surgery. Reviewed that she should make sure she monitors her blood pressures, heart rates, and keep a log of those readings. Instructed her to please call if BP runs higher than 140/90 and if heart rates remain elevated even with rest. She verbalized understanding of our conversation, agreement with plan, and had no further questions at this time.

## 2018-03-16 NOTE — Telephone Encounter (Signed)
Pt calling stating two weeks ago from today she had cataract surgery She states since then she's been having A flutter episodes  She states she thought it was just the nurse who was just not nice to her there that may be causing her to feel like this But she states her spouse has been watching her BP and HR  And HR is concerning for there have been times where it has been more than 100  She would like some advise   She states if we can't get a hold of her at home we may call her Cell phone  [phone]

## 2018-03-24 ENCOUNTER — Telehealth: Payer: Self-pay | Admitting: Cardiovascular Disease

## 2018-03-24 NOTE — Telephone Encounter (Signed)
Spoke with patient and she states she has been having problems with seasonal allergies and wanted to check with [place] regarding some new lightheadedness and dizziness. She does report sneezing when she goes out due to the high pollen at her home. Reviewed that she may want to check with her primary care provider regarding these symptoms. Blood pressure readings were within normal limits and she had no other complaints at this time. She verbalized understanding of our conversation and had no further questions at this time.

## 2018-03-24 NOTE — Telephone Encounter (Signed)
Pt c/o BP issue: STAT if pt c/o blurred vision, one-sided weakness or slurred speech  1. What are your last 5 BP readings?  This morning: 125/84 pulse 107 115/86 pulse 113 122/80 pulse 115  2. Are you having any other symptoms (ex. Dizziness, headache, blurred vision, passed out)? Lightheadedness, dizzy, heart palpitations  3. What is your BP issue? Feels her blood pressure has been normal but is concerned with increasing heart rate

## 2018-04-01 NOTE — Progress Notes (Signed)
Cardiology Office Note  Date:  04/04/2018   ID:  Amanda Dalton, DOB 01/04/1931, MRN [id number]  PCP:  [place] Pink, MD   Chief Complaint  Patient presents with  . other    Dizziness. Meds reviewed verbally with pt.    HPI:  84 y/o ? with a h/o  hypothyroidism,  anxiety,  asthma,  atrial flutter rate 120 bpm, started on anticoagulation, the Toprol for rate control  previous episode of atrial flutter presented in the setting of attending a party 10/2016: fib flutter 11/2016: NSR who presents for  follow-up of her arrhythmia, atrial fibrillation  In march was in NSR on her last clinic visit Reports having dizziness past 6 weeks Went to urgent care recently started on atenolol presumably for tachycardia and atrial fibrillation but details not available  Today is in atrial fibrillation on EKG  Having "heavy head", fogginess Palpitations, better with  Atenolol, only occasional  Not very active at baseline No significant chest pain or shortness of breath Tolerating her Eliquis 5 twice daily Denies missing any doses No GI bleeding   history of falls, none recently  Was previously on metoprolol after episode of flutter She stop metoprolol on her own in the past  Previous GI bleed, went to the ER at [facility], [place] stopped on its own   EKG personally reviewed by myself on todays visit Shows trial fibrillation with ventricular rate 74 bpm no significant ST or T-wave changes  Other past medical history reviewed Previously received adenosine IV push for tachycardia revealing flutter waves CHA2DS2VASc of 3 (age/age/gender), started on eliquis 5 mg BID (creat 0.8 12/4, wt > 60 kg)  Echocardiogram 12/08/2016 Ejection fraction 50-55%, moderately elevated right heart pressures 50-55 mmHg  Had a fall Nov 2017: large laceration on forehead  Now idea how long she was in atrial flutter   PMH:   has a past medical history of Anxiety, Asthma, Atrial flutter ([place]), Breast  cancer ([place]), Endometrial cancer ([place]), Fracture of right humerus (11/2008), Hypothyroidism, and Osteoporosis.  PSH:    Past Surgical History:  Procedure Laterality Date  . Right Mastectomy  1996  . VAGINAL HYSTERECTOMY  08/2001    Current Outpatient Medications  Medication Sig Dispense Refill  . albuterol (PROAIR HFA) 108 (90 Base) MCG/ACT inhaler Inhale 1-2 puffs into the lungs every 6 (six) hours as needed for wheezing or shortness of breath. 1 Inhaler 5  . atenolol (TENORMIN) 25 MG tablet Take 25 mg by mouth daily.     . Calcium Carbonate-Vitamin D (CALCIUM-VITAMIN D) 500-200 MG-UNIT tablet Take 1 tablet by mouth daily.    . cetirizine (ZYRTEC) 10 MG chewable tablet Chew 10 mg by mouth daily.    . cholecalciferol (VITAMIN D) 1000 units tablet Take 2,000 Units by mouth daily.    . citalopram (CELEXA) 20 MG tablet Take 30 mg by mouth daily.    Marland Kitchen ELIQUIS 5 MG TABS tablet TAKE 1 TABLET BY MOUTH TWICE DAILY 60 tablet 6  . Flaxseed, Linseed, (FLAX SEED OIL PO) Take 1 capsule by mouth daily.    Marland Kitchen levothyroxine (SYNTHROID, LEVOTHROID) 75 MCG tablet Take 75 mcg by mouth daily before breakfast.    . magnesium oxide (MAG-OX) 400 MG tablet Take 400 mg by mouth daily.    . montelukast (SINGULAIR) 10 MG tablet Take 10 mg by mouth at bedtime.    . Multiple Vitamin (MULTIVITAMIN WITH MINERALS) TABS tablet Take 1 tablet by mouth daily.    . vitamin C (ASCORBIC ACID)  500 MG tablet Take 500 mg by mouth daily.     No current facility-administered medications for this visit.      Allergies:   Paxil [paroxetine hcl]   Social History:  The patient  reports that she has never smoked. She has never used smokeless tobacco. She reports that she drinks about 1.2 oz of alcohol per week. She reports that she does not use drugs.   Family History:   family history includes Breast cancer in her sister; Hypertension in her mother; Osteoporosis in her sister; Stroke in her father and mother.    Review of  Systems: Review of Systems  Constitutional: Negative.   Respiratory: Negative.   Cardiovascular: Positive for palpitations.  Gastrointestinal: Negative.   Musculoskeletal: Negative.        Gait instability  Neurological: Positive for dizziness.  Psychiatric/Behavioral: Negative.   All other systems reviewed and are negative.    PHYSICAL EXAM: VS:  BP 132/82 (BP Location: Left Arm, Patient Position: Sitting, Cuff Size: Normal)   Pulse 74   Ht 5\' 1"  (1.549 m)   Wt 150 lb 8 oz (68.3 kg)   BMI 28.44 kg/m  , BMI Body mass index is 28.44 kg/m. Constitutional:  oriented to person, place, and time. No distress.  HENT:  Head: Normocephalic and atraumatic.  Eyes:  no discharge. No scleral icterus.  Neck: Normal range of motion. Neck supple. No JVD present.  Cardiovascular: irregularly irregular normal heart sounds and intact distal pulses. Exam reveals no gallop and no friction rub. No edema No murmur heard. Pulmonary/Chest: Effort normal and breath sounds normal. No stridor. No respiratory distress.  no wheezes.  no rales.  no tenderness.  Abdominal: Soft.  no distension.  no tenderness.  Musculoskeletal: Normal range of motion.  no  tenderness or deformity.  Neurological:  normal muscle tone. Coordination normal. No atrophy Skin: Skin is warm and dry. No rash noted. not diaphoretic.  Psychiatric:  normal mood and affect. behavior is normal. Thought content normal.    Recent Labs: No results found for requested labs within last 8760 hours.    Lipid Panel No results found for: CHOL, HDL, LDLCALC, TRIG    Wt Readings from Last 3 Encounters:  04/04/18 150 lb 8 oz (68.3 kg)  02/17/18 150 lb (68 kg)  01/23/18 150 lb (68 kg)       ASSESSMENT AND PLAN:  Persistent atrial fibrillation Timing unclear possibly 6 weeks ago when she started having dizziness Currently relatively well rate controlled on atenolol Long discussion with her concerning various treatment options for her  atrial fibrillation Discussed rate control and continue anticoagulation versus trying to restore normal sinus rhythm She prefers to restore normal sinus rhythm if possible She does not want DCCV at this time and prefers to try medication Recommended she start amiodarone 200 mg twice a day. Repeat EKG in 2-3 weeks time She does bring heart rate measurements from home detailing some heart rates in the high 50s We will avoid aggressive amiodarone loading with close monitoring of heart rate at home by her husband  Encounter for anticoagulation discussion and counseling Compliant with her eliquis, denies missing any days  Anxiety Stable symptoms  Hypothyroidism, unspecified type Takes thyroid medication daily in the morning stable  Preventive screening Cholesterol high 235 Previously declined workup including CT coronary calcium scoring  Hyperlipidemia Cholesterol 235 She does not want a statin at this time  Uncomplicated asthma, unspecified asthma severity, unspecified whether persistent  has not been a  major issue recently Stable  Gait instability/risk of falls Recommended walking program No recent falls   Total encounter time more than 45 minutes  Greater than 50% was spent in counseling and coordination of care with the patient   Disposition:   F/U  1 month   Orders Placed This Encounter  Procedures  . EKG 12-Lead     Signed, Esmond Plants, M.D., Ph.D. 04/04/2018  [place], [place]

## 2018-04-04 ENCOUNTER — Encounter: Payer: Self-pay | Admitting: Cardiovascular Disease

## 2018-04-04 ENCOUNTER — Ambulatory Visit (INDEPENDENT_AMBULATORY_CARE_PROVIDER_SITE_OTHER): Payer: Medicare Other | Admitting: Cardiovascular Disease

## 2018-04-04 VITALS — BP 132/82 | HR 74 | Ht 61.0 in | Wt 150.5 lb

## 2018-04-04 DIAGNOSIS — I4819 Other persistent atrial fibrillation: Secondary | ICD-10-CM

## 2018-04-04 DIAGNOSIS — Z7189 Other specified counseling: Secondary | ICD-10-CM

## 2018-04-04 DIAGNOSIS — I4892 Unspecified atrial flutter: Secondary | ICD-10-CM | POA: Diagnosis not present

## 2018-04-04 DIAGNOSIS — E782 Mixed hyperlipidemia: Secondary | ICD-10-CM | POA: Diagnosis not present

## 2018-04-04 DIAGNOSIS — I481 Persistent atrial fibrillation: Secondary | ICD-10-CM | POA: Diagnosis not present

## 2018-04-04 DIAGNOSIS — F419 Anxiety disorder, unspecified: Secondary | ICD-10-CM

## 2018-04-04 MED ORDER — AMIODARONE HCL 200 MG PO TABS: 200.0000 mg | ORAL_TABLET | Freq: Two times a day (BID) | ORAL | 6 refills | Status: DC

## 2018-04-04 NOTE — Patient Instructions (Addendum)
Medication Instructions:   Please start amiodarone 200 mg twice a day Repeat EKG in 2 to 3 weeks  Labwork:  No new labs needed  Testing/Procedures:  You are scheduled for a Cardioversion on ________________ with Dr.___________ Please arrive at the [place] [facility] at _________ a.m. on the day of your procedure.  DIET INSTRUCTIONS:  Nothing to eat or drink after midnight except your medications with a small sip of water.         1) Labs: __________________  2) Medications:  YOU MAY TAKE ALL of your remaining medications with a small amount of water.  3) Must have a responsible person to drive you home.  4) Bring a current list of your medications and current insurance cards.    If you have any questions after you get home, please call the office at [phone]    Follow-Up: It was a pleasure seeing you in the office today. Please call us if you have new issues that need to be addressed before your next appt.  [phone]  Your physician wants you to follow-up in:  2 to 3 weeks with EKG    If you need a refill on your cardiac medications before your next appointment, please call your pharmacy.  For educational health videos Log in to : [email or web address] Or : [email or web address], password : [email or web address]

## 2018-04-07 NOTE — Telephone Encounter (Signed)
Patient calling  States that her head issues are getting worse She states her head feels heavy and has been dizzy Her pulse rate has been going down, this morning it was 48 Her blood pressure this morning was 126/65 Please call to discuss

## 2018-04-07 NOTE — Telephone Encounter (Signed)
Reviewed the patient's BP/ HR's with Dr. Rockey Situ. Orders received to have the patient decrease amiodarone to 200 mg once daily and follow up with her EKG on 04/17/18 as planned.   I have called the patient and notified her of the above. She states she took her eliquis and amiodarone this morning, but she takes atenolol at night.  She reports that her HR's are around 40 bpm now. I have advised her to hold her atenolol tonight and again tomorrow night if her HR's are in the 40's. She will continue to monitor her BP/ HR readings and I have asked that she call back Monday with those readings.  She is aware if her dizziness worsens or she has syncope/ feels pre-syncopal to report to the ER for further evaluation.  The patient voices understanding and is agreeable.

## 2018-04-07 NOTE — Telephone Encounter (Signed)
I called and spoke with the patient. She has been having a dizzy feeling in her head for the last 6 weeks, but this has gotten worse since starting amiodarone 200 mg BID at her office visit on 04/04/18 with Dr. Rockey Situ.  She reports BP/ HR readings today of: 112/67 (50) & 126/65 (48). She states she has never had HR's in the 40's. I inquired what her home readings were prior to her starting amiodarone and per her report, HR would run in the 50's-70's.  She is also currently taking atenolol 25 mg once daily. She is also on zyrtec 10 mg once daily and singulair 10 mg once daily.  Per the patient, she cannot tell when her heart goes in and out of rhythm.  I advised her I will review with Dr. Rockey Situ to see if he would like her to come in for an EKG sooner or if he would like to adjust her medications over the phone.  She is aware Dr. Rockey Situ will not be in clinic until this afternoon, but I will review with him at that time and call her back. She voices understanding.

## 2018-04-10 ENCOUNTER — Telehealth: Payer: Self-pay | Admitting: Cardiovascular Disease

## 2018-04-10 MED ORDER — AMIODARONE HCL 200 MG PO TABS: 200.0000 mg | ORAL_TABLET | Freq: Every day | ORAL | 6 refills | Status: DC

## 2018-04-10 NOTE — Telephone Encounter (Signed)
She may benefit from cardioversion  For atrial fibrillation Then potentially we could stop some of the medication such as amiodarone

## 2018-04-10 NOTE — Telephone Encounter (Signed)
Patient verbalized understanding of the potential plan of care. Offered for her to come on in for an EKG and schedule cardioversion verses wait until scheduled EKG nurse visit for 04/17/18. She discussed with husband and stated she will wait until 04/17/18 for EKG and call us before hand if needed.

## 2018-04-10 NOTE — Telephone Encounter (Signed)
Patient called back. She would like to go ahead and schedule the cardioversion and go with this plan. She will plan to come in tomorrow morning at 0830 am for EKG and potentially scheduling of cardioversion.

## 2018-04-10 NOTE — Telephone Encounter (Signed)
S/w patient. See previous entry for BPs/HRs. She said she had episodes Saturday and Sunday night where she woke up around 1:30 or 2 am with shortness of breath. The episodes lasted 15-20 min. She got up and took her heart rate with her pulse ox and it was in the 30's initially and came back up to the 50-60's after a few minutes. She is still taking the amiodarone once a day and atenolol in the evenings. She feels tired as well. Advised patient to continue with medications as prescribed and EKG on 04/17/18 and I will route to Dr Rockey Situ for review.

## 2018-04-10 NOTE — Telephone Encounter (Signed)
°  BP Log   Saturday  119/85  55  Sunday  147/71  56  Monday  134/71  61   139/77  59   142/73  59  Pt c/o Shortness Of Breath: STAT if SOB developed within the last 24 hours or pt is noticeably SOB on the phone  1. Are you currently SOB (can you hear that pt is SOB on the phone)? Somewhat not as bad   2. How long have you been experiencing SOB? Saturday   3. Are you SOB when sitting or when up moving around? X 2 episodes when laying in bed Saturday and Sunday night   4. Are you currently experiencing any other symptoms?  HR dropped to 30's during episode

## 2018-04-11 ENCOUNTER — Ambulatory Visit (INDEPENDENT_AMBULATORY_CARE_PROVIDER_SITE_OTHER): Payer: Medicare Other | Admitting: *Deleted

## 2018-04-11 VITALS — BP 138/88 | HR 81 | Ht 61.0 in | Wt 149.5 lb

## 2018-04-11 DIAGNOSIS — Z01810 Encounter for preprocedural cardiovascular examination: Secondary | ICD-10-CM

## 2018-04-11 DIAGNOSIS — I4891 Unspecified atrial fibrillation: Secondary | ICD-10-CM | POA: Diagnosis not present

## 2018-04-11 NOTE — Patient Instructions (Addendum)
Medication Instructions:  Your physician recommends that you continue on your current medications as directed. Please refer to the Current Medication list given to you today.   Labwork: Your physician recommends that you return for lab work in: TODAY (CBC, BMET, PT/INR).   Testing/Procedures: Your physician has recommended that you have a Cardioversion (DCCV). Electrical Cardioversion uses a jolt of electricity to your heart either through paddles or wired patches attached to your chest. This is a controlled, usually prescheduled, procedure. Defibrillation is done under light anesthesia in the hospital, and you usually go home the day of the procedure. This is done to get your heart back into a normal rhythm. You are not awake for the procedure. Please see the instruction sheet given to you today.    You are scheduled for a Cardioversion on _05/22/19__ with Dr.__Gollan__ Please arrive at the [place] [facility] at __06:30_ a.m. on the day of your procedure.  DIET INSTRUCTIONS:  Nothing to eat or drink after midnight except your medications with a small sip of water.         1) Labs: __today_____  2) Medications:  YOU MAY TAKE ALL of your remaining medications with a small amount of water.  3) Must have a responsible person to drive you home.  4) Bring a current list of your medications and current insurance cards.    If you have any questions after you get home, please call the office at [phone]   Follow-Up: Your physician recommends that you schedule a follow-up appointment in: to be determined. Usually 2-4 weeks post procedure.   If you need a refill on your cardiac medications before your next appointment, please call your pharmacy.     Electrical Cardioversion Electrical cardioversion is the delivery of a jolt of electricity to restore a normal rhythm to the heart. A rhythm that is too fast or is not regular keeps the heart from pumping well. In this procedure, sticky  patches or metal paddles are placed on the chest to deliver electricity to the heart from a device. This procedure may be done in an emergency if:  There is low or no blood pressure as a result of the heart rhythm.  Normal rhythm must be restored as fast as possible to protect the brain and heart from further damage.  It may save a life.  This procedure may also be done for irregular or fast heart rhythms that are not immediately life-threatening. Tell a health care provider about:  Any allergies you have.  All medicines you are taking, including vitamins, herbs, eye drops, creams, and over-the-counter medicines.  Any problems you or family members have had with anesthetic medicines.  Any blood disorders you have.  Any surgeries you have had.  Any medical conditions you have.  Whether you are pregnant or may be pregnant. What are the risks? Generally, this is a safe procedure. However, problems may occur, including:  Allergic reactions to medicines.  A blood clot that breaks free and travels to other parts of your body.  The possible return of an abnormal heart rhythm within hours or days after the procedure.  Your heart stopping (cardiac arrest). This is rare.  What happens before the procedure? Medicines  Your health care provider may have you start taking: ? Blood-thinning medicines (anticoagulants) so your blood does not clot as easily. ? Medicines may be given to help stabilize your heart rate and rhythm.  Ask your health care provider about changing or stopping your  regular medicines. This is especially important if you are taking diabetes medicines or blood thinners. General instructions  Plan to have someone take you home from the hospital or clinic.  If you will be going home right after the procedure, plan to have someone with you for 24 hours.  Follow instructions from your health care provider about eating or drinking restrictions. What happens during  the procedure?  To lower your risk of infection: ? Your health care team will wash or sanitize their hands. ? Your skin will be washed with soap.  An IV tube will be inserted into one of your veins.  You will be given a medicine to help you relax (sedative).  Sticky patches (electrodes) or metal paddles may be placed on your chest.  An electrical shock will be delivered. The procedure may vary among health care providers and hospitals. What happens after the procedure?  Your blood pressure, heart rate, breathing rate, and blood oxygen level will be monitored until the medicines you were given have worn off.  Do not drive for 24 hours if you were given a sedative.  Your heart rhythm will be watched to make sure it does not change. This information is not intended to replace advice given to you by your health care provider. Make sure you discuss any questions you have with your health care provider. Document Released: 11/05/2002 Document Revised: 07/14/2016 Document Reviewed: 05/21/2016 Elsevier Interactive Patient Education  2017 [employer].

## 2018-04-11 NOTE — Progress Notes (Signed)
1.) Reason for visit: EKG check  2.) Name of MD requesting visit: Gollan  3.) H&P: atrial fibrillation  4.) ROS related to problem: Patient came in for EKG check with the plan to schedule a cardioversion. Patient did not have an episode last night where she she woke up short of breath. She still feels tired. She was noticeably short of breath with exertion upon walking to exam room and getting up on exam table. Patient and spouse agreeable to cardioversion.  5.) Assessment and plan per MD: EKG says atrial fibrillation. Dr Rockey Situ to review. Will plan for cardioversion on 04/19/18 at 7:30 am upon official review of EKG by Dr Rockey Situ.  Instructions for cardioversion given to patient on AVS. Lab work drawn.

## 2018-04-12 LAB — CBC WITH DIFFERENTIAL/PLATELET
Basophils Absolute: 0 10*3/uL (ref 0.0–0.2)
Basos: 0 %
EOS (ABSOLUTE): 0.1 10*3/uL (ref 0.0–0.4)
Eos: 1 %
Hematocrit: 37.2 % (ref 34.0–46.6)
Hemoglobin: 12.4 g/dL (ref 11.1–15.9)
IMMATURE GRANS (ABS): 0 10*3/uL (ref 0.0–0.1)
IMMATURE GRANULOCYTES: 0 %
LYMPHS: 17 %
Lymphocytes Absolute: 1.3 10*3/uL (ref 0.7–3.1)
MCH: 32.5 pg (ref 26.6–33.0)
MCHC: 33.3 g/dL (ref 31.5–35.7)
MCV: 97 fL (ref 79–97)
MONOS ABS: 0.8 10*3/uL (ref 0.1–0.9)
Monocytes: 10 %
NEUTROS PCT: 72 %
Neutrophils Absolute: 5.3 10*3/uL (ref 1.4–7.0)
PLATELETS: 275 10*3/uL (ref 150–379)
RBC: 3.82 x10E6/uL (ref 3.77–5.28)
RDW: 13.4 % (ref 12.3–15.4)
WBC: 7.5 10*3/uL (ref 3.4–10.8)

## 2018-04-12 LAB — PROTIME-INR
INR: 1.2 (ref 0.8–1.2)
PROTHROMBIN TIME: 11.9 s (ref 9.1–12.0)

## 2018-04-12 LAB — BASIC METABOLIC PANEL
BUN/Creatinine Ratio: 18 (ref 12–28)
BUN: 15 mg/dL (ref 8–27)
CALCIUM: 9.1 mg/dL (ref 8.7–10.3)
CHLORIDE: 100 mmol/L (ref 96–106)
CO2: 22 mmol/L (ref 20–29)
Creatinine, Ser: 0.83 mg/dL (ref 0.57–1.00)
GFR calc Af Amer: 75 mL/min/{1.73_m2} (ref >59)
GFR calc non Af Amer: 65 mL/min/{1.73_m2} (ref >59)
Glucose: 110 mg/dL — ABNORMAL HIGH (ref 65–99)
POTASSIUM: 4.6 mmol/L (ref 3.5–5.2)
Sodium: 138 mmol/L (ref 134–144)

## 2018-04-13 ENCOUNTER — Other Ambulatory Visit: Payer: Self-pay | Admitting: *Deleted

## 2018-04-13 DIAGNOSIS — I4891 Unspecified atrial fibrillation: Secondary | ICD-10-CM

## 2018-04-17 ENCOUNTER — Ambulatory Visit: Payer: Medicare Other

## 2018-04-18 ENCOUNTER — Other Ambulatory Visit: Payer: Self-pay | Admitting: Cardiovascular Disease

## 2018-04-18 ENCOUNTER — Telehealth: Payer: Self-pay | Admitting: Cardiovascular Disease

## 2018-04-18 NOTE — Telephone Encounter (Signed)
Spoke with patient and reviewed arrival time for cardioversion tomorrow, instructions, and location with her in detail. She verbalized understanding of our conversation and had no further questions at this time.

## 2018-04-18 NOTE — Telephone Encounter (Signed)
Pt has some questions regarding her cardioversion. Please call.

## 2018-04-19 ENCOUNTER — Encounter: Payer: Self-pay | Admitting: Anesthesiology

## 2018-04-19 ENCOUNTER — Ambulatory Visit
Admission: RE | Admit: 2018-04-19 | Discharge: 2018-04-19 | Disposition: A | Discharge status: Home / Self Care | Payer: Medicare Other | Source: Ambulatory Visit | Attending: Cardiovascular Disease | Admitting: Cardiovascular Disease

## 2018-04-19 ENCOUNTER — Encounter
Admission: RE | Disposition: A | Discharge status: Home / Self Care | Payer: Self-pay | Source: Ambulatory Visit | Attending: Cardiovascular Disease

## 2018-04-19 ENCOUNTER — Encounter: Payer: Self-pay | Admitting: *Deleted

## 2018-04-19 DIAGNOSIS — I481 Persistent atrial fibrillation: Secondary | ICD-10-CM | POA: Insufficient documentation

## 2018-04-19 DIAGNOSIS — Z538 Procedure and treatment not carried out for other reasons: Secondary | ICD-10-CM | POA: Insufficient documentation

## 2018-04-19 HISTORY — PX: CARDIOVERSION: EP1203

## 2018-04-19 SURGERY — CARDIOVERSION (CATH LAB)
Anesthesia: General

## 2018-04-19 MED ORDER — PROPOFOL 500 MG/50ML IV EMUL
INTRAVENOUS | Status: AC
  Filled 2018-04-19: qty 50

## 2018-04-19 NOTE — Progress Notes (Signed)
Cardioversion  Cancelled today, presenting in NSR Sinus brady rate 49 bpm with short PR interval She is on amio 200 daily with atenolol 25 daily  We recommended she decrease the amiodarone down to 100 mg daily, decrease atenolol to 12.5 mg daily (down from  25 daily)  Signed, Esmond Plants, MD, Ph.D [facility]

## 2018-04-20 ENCOUNTER — Encounter: Payer: Self-pay | Admitting: Cardiovascular Disease

## 2018-05-09 ENCOUNTER — Encounter: Payer: Self-pay | Admitting: Cardiovascular Disease

## 2018-05-09 ENCOUNTER — Ambulatory Visit (INDEPENDENT_AMBULATORY_CARE_PROVIDER_SITE_OTHER): Payer: Medicare Other | Admitting: Cardiovascular Disease

## 2018-05-09 VITALS — BP 128/60 | HR 51 | Ht 61.0 in | Wt 149.2 lb

## 2018-05-09 DIAGNOSIS — I4892 Unspecified atrial flutter: Secondary | ICD-10-CM

## 2018-05-09 DIAGNOSIS — I481 Persistent atrial fibrillation: Secondary | ICD-10-CM

## 2018-05-09 DIAGNOSIS — I4819 Other persistent atrial fibrillation: Secondary | ICD-10-CM

## 2018-05-09 DIAGNOSIS — E782 Mixed hyperlipidemia: Secondary | ICD-10-CM | POA: Diagnosis not present

## 2018-05-09 DIAGNOSIS — Z7189 Other specified counseling: Secondary | ICD-10-CM

## 2018-05-09 NOTE — Patient Instructions (Signed)
Medication Instructions:   For heart rate in the 40s, Skip the amiodarone Recheck in the PM If rate in the 50s,  Ok to take atenolol  Labwork:  No new labs needed  Testing/Procedures:  No further testing at this time   Follow-Up: It was a pleasure seeing you in the office today. Please call us if you have new issues that need to be addressed before your next appt.  [phone]  Your physician wants you to follow-up in: 6 months.  You will receive a reminder letter in the mail two months in advance. If you don't receive a letter, please call our office to schedule the follow-up appointment.  If you need a refill on your cardiac medications before your next appointment, please call your pharmacy.  For educational health videos Log in to : [email or web address] Or : [email or web address], password : [email or web address]

## 2018-05-09 NOTE — Progress Notes (Signed)
Cardiology Office Note  Date:  05/09/2018   ID:  Amanda Dalton, DOB 08-May-1931, MRN [id number]  PCP:  [place] Pink, MD   Chief Complaint  Patient presents with  . Other    2-4 week follow up. Patient c/o some swelling in feet but it is not all the time. Meds reviewed verbally with patient.     HPI:  84 y/o ? with a h/o  hypothyroidism,  anxiety,  asthma,  atrial flutter rate 120 bpm, started on anticoagulation, the Toprol for rate control  previous episode of atrial flutter presented in the setting of attending a party 10/2016: fib flutter 11/2016: NSR who presents for  follow-up of her arrhythmia, atrial fibrillation  Cardioversion cancelled 04/19/2018 Was in NSR on amiodarone and atenolol on arrival to the hospital She had bradycardia and amiodarone decreased down to 100 mg daily atenolol down to 12.5 mg daily  Previously reported having dizziness  When in atrial fibrillation  "heavy head", fogginess Palpitations, Since she has maintained normal sinus rhythm, symptoms have improved  Not very active at baseline No chest pain or shortness of breath Tolerating her Eliquis 5 twice daily   history of falls, none recently  Was previously on metoprolol after episode of flutter She stop metoprolol on her own in the past  Previous GI bleed, went to the ER at [facility] [place] stopped on its own   EKG personally reviewed by myself on todays visit Shows atrial fibrillation with ventricular rate 51 bpm no significant ST or T-wave changes  Other past medical history reviewed Previously received adenosine IV push for tachycardia revealing flutter waves CHA2DS2VASc of 3 (age/age/gender), started on eliquis 5 mg BID (creat 0.8 12/4, wt > 60 kg)  Echocardiogram 12/08/2016 Ejection fraction 50-55%, moderately elevated right heart pressures 50-55 mmHg  Had a fall Nov 2017: large laceration on forehead  Now idea how long she was in atrial flutter   PMH:   has a past  medical history of Anxiety, Asthma, Atrial flutter ([place]), Breast cancer ([place]), Endometrial cancer ([place]), Fracture of right humerus (11/2008), Hypothyroidism, and Osteoporosis.  PSH:    Past Surgical History:  Procedure Laterality Date  . CARDIOVERSION N/A 04/19/2018   Procedure: CARDIOVERSION;  Surgeon: Minna Merritts, MD;  Location: [facility] ORS;  Service: Cardiovascular;  Laterality: N/A;  . Right Mastectomy  1996  . VAGINAL HYSTERECTOMY  08/2001    Current Outpatient Medications  Medication Sig Dispense Refill  . albuterol (PROAIR HFA) 108 (90 Base) MCG/ACT inhaler Inhale 1-2 puffs into the lungs every 6 (six) hours as needed for wheezing or shortness of breath. 1 Inhaler 5  . amiodarone (PACERONE) 200 MG tablet Take 1 tablet (200 mg total) by mouth daily. (Patient taking differently: Take 200 mg by mouth daily. ) 60 tablet 6  . atenolol (TENORMIN) 25 MG tablet Take 12.5 mg by mouth every evening.     . Calcium Carb-Cholecalciferol (CALCIUM-VITAMIN D) 600-400 MG-UNIT TABS Take 1 tablet by mouth daily.     . cetirizine (ZYRTEC) 10 MG tablet Take 10 mg by mouth daily.     . Cholecalciferol (VITAMIN D) 2000 units CAPS Take 2,000 Units by mouth daily.     . citalopram (CELEXA) 40 MG tablet Take 40 mg by mouth every evening.     Marland Kitchen ELIQUIS 5 MG TABS tablet TAKE 1 TABLET BY MOUTH TWICE DAILY 60 tablet 6  . Flaxseed, Linseed, (FLAX SEED OIL PO) Take 1 capsule by mouth daily.    Marland Kitchen levothyroxine (  SYNTHROID, LEVOTHROID) 75 MCG tablet Take 75 mcg by mouth daily before breakfast.    . magnesium oxide (MAG-OX) 400 MG tablet Take 400 mg by mouth daily.    . montelukast (SINGULAIR) 10 MG tablet Take 10 mg by mouth every evening.     . Multiple Vitamin (MULTIVITAMIN WITH MINERALS) TABS tablet Take 1 tablet by mouth daily.    . Probiotic Product (PROBIOTIC PO) Take 1 capsule by mouth daily.    . vitamin C (ASCORBIC ACID) 500 MG tablet Take 500 mg by mouth daily.     No current facility-administered  medications for this visit.      Allergies:   Paxil [paroxetine hcl]   Social History:  The patient  reports that she has never smoked. She has never used smokeless tobacco. She reports that she drinks about 1.2 oz of alcohol per week. She reports that she does not use drugs.   Family History:   family history includes Breast cancer in her sister; Hypertension in her mother; Osteoporosis in her sister; Stroke in her father and mother.    Review of Systems: Review of Systems  Constitutional: Negative.   Respiratory: Negative.   Cardiovascular: Negative.   Gastrointestinal: Negative.   Musculoskeletal: Negative.        Gait instability  Neurological: Negative.   Psychiatric/Behavioral: Negative.   All other systems reviewed and are negative.    PHYSICAL EXAM: VS:  BP 128/60 (BP Location: Left Arm, Patient Position: Sitting, Cuff Size: Normal)   Pulse (!) 51   Ht 5\' 1"  (1.549 m)   Wt 149 lb 4 oz (67.7 kg)   BMI 28.20 kg/m  , BMI Body mass index is 28.2 kg/m.   Constitutional:  oriented to person, place, and time. No distress.  HENT:  Head: Normocephalic and atraumatic.  Eyes:  no discharge. No scleral icterus.  Neck: Normal range of motion. Neck supple. No JVD present.  Cardiovascular: regularly regular,  normal heart sounds and intact distal pulses. Exam reveals no gallop and no friction rub. No edema No murmur heard. Pulmonary/Chest: Effort normal and breath sounds normal. No stridor. No respiratory distress.  no wheezes.  no rales.  no tenderness.  Abdominal: Soft.  no distension.  no tenderness.  Musculoskeletal: Normal range of motion.  no  tenderness or deformity.  Neurological:  normal muscle tone. Coordination normal. No atrophy Skin: Skin is warm and dry. No rash noted. not diaphoretic.  Psychiatric:  normal mood and affect. behavior is normal. Thought content normal.    Recent Labs: 04/11/2018: BUN 15; Creatinine, Ser 0.83; Hemoglobin 12.4; Platelets 275;  Potassium 4.6; Sodium 138    Lipid Panel No results found for: CHOL, HDL, LDLCALC, TRIG    Wt Readings from Last 3 Encounters:  05/09/18 149 lb 4 oz (67.7 kg)  04/19/18 149 lb (67.6 kg)  04/11/18 149 lb 8 oz (67.8 kg)       ASSESSMENT AND PLAN:  Persistent atrial fibrillation Maintaining normal sinus rhythm, converted on her own with amiodarone oral loading Given bradycardia will stay on 100 mg daily with atenolol 12.5 mg daily If heart rate goes into the 40s recommended she hold her atenolol or amiodarone that day Husband will continue to monitor her heart rate daily  Encounter for anticoagulation discussion and counseling Compliant with her eliquis No bleeding  Anxiety Stable symptoms Managed by primary care  Hypothyroidism, unspecified type Takes thyroid medication daily in the morning stable  Preventive screening Cholesterol high 235 Previously declined workup  including CT coronary calcium scoring Does not want cholesterol medication  Hyperlipidemia Cholesterol 235 As above does not want medication management  Uncomplicated asthma, unspecified asthma severity, unspecified whether persistent  has not been a major issue recently Stable. Managed by primary care  Gait instability/risk of falls Recommended walking program No recent falls   Total encounter time more than 25 minutes  Greater than 50% was spent in counseling and coordination of care with the patient   Disposition:   F/U  6 month   Orders Placed This Encounter  Procedures  . EKG 12-Lead     Signed, Esmond Plants, M.D., Ph.D. 05/09/2018  [place], [place]

## 2018-05-30 ENCOUNTER — Telehealth: Payer: Self-pay | Admitting: Cardiovascular Disease

## 2018-05-30 NOTE — Telephone Encounter (Signed)
Pt returning our call °Please call back ° °

## 2018-05-30 NOTE — Telephone Encounter (Signed)
Left voicemail message to call back  

## 2018-05-30 NOTE — Telephone Encounter (Signed)
Pt calling stating she is worried about how she is taking her Amiodarone She states she was told by Dr Rockey Situ that she is to take it only if her HR is more that 50  She states this morning it was about 39 and so she took it Now she is stating it is about 51   She is calling for she wants to make sure she did that correct and also she happen to use [employer] the medication and online it said people over 65 shouldn't be taking it. Would like advise on that as well. Please call back

## 2018-05-31 MED ORDER — AMIODARONE HCL 200 MG PO TABS: 100.0000 mg | ORAL_TABLET | Freq: Every day | ORAL | 3 refills | Status: DC

## 2018-05-31 MED ORDER — ATENOLOL 25 MG PO TABS: 12.5000 mg | ORAL_TABLET | Freq: Every evening | ORAL | 3 refills | Status: DC

## 2018-05-31 NOTE — Telephone Encounter (Signed)
Spoke with patient and she is very confused on how to take her medications. Reviewed medications with patient and she is taking 1/2 atenolol and whole pill of amiodarone. Reviewed her medications in detail and advised that she should be taking 1/2 tablet of both. She reviewed AVS instructions from last visit and it does not have Amiodarone listed at 100 mg. Reviewed this information in detail and will send in corrected instructions on her amiodarone and atenolol. She was very appreciative for the call back with no further questions at this time.

## 2018-06-27 ENCOUNTER — Telehealth: Payer: Self-pay | Admitting: Cardiovascular Disease

## 2018-06-27 DIAGNOSIS — I480 Paroxysmal atrial fibrillation: Secondary | ICD-10-CM

## 2018-06-27 NOTE — Telephone Encounter (Signed)
Patient calling about erratic pulse rates No further details given - would like to speak with Pam Please call to discuss

## 2018-06-29 NOTE — Telephone Encounter (Signed)
Patient returning call.

## 2018-06-29 NOTE — Telephone Encounter (Signed)
Left voicemail message to call back  

## 2018-06-29 NOTE — Telephone Encounter (Signed)
Spoke with patient and she states that her pulse rate jumps around and has just been all over the place. She said that when she got up this morning it was in the 80's and then at 07:30AM it was down to 45. She then checked it again at 08:00AM to take her medications and it was 51 so she took her amiodarone. Reviewed parameters that were put in place at her last visit to hold amiodarone for heart rates in the 40's and if rate is up in the PM to then take atenolol. She states that she has been doing it this way but she has not been feeling good the last couple of weeks. Advised that I would send this information over to Dr. Rockey Situ for review and further recommendations. She verbalized understanding of our conversation, agreement with plan, and had no further questions at this time.

## 2018-07-03 NOTE — Telephone Encounter (Signed)
Dr. Rockey Situ,  Do you want her to wear a monitor (holter/ ZIO) prior to a consult with EP?

## 2018-07-03 NOTE — Telephone Encounter (Signed)
Ok to order a monitor, zio Thanks TG

## 2018-07-03 NOTE — Telephone Encounter (Signed)
She may be having paroxysmal atrial fib flutter She may benefot from seeing dr. Caryl Comes for Sick sinus With minimal treatment of her atrial fib she has bradycardia

## 2018-07-04 NOTE — Telephone Encounter (Signed)
Called returned to the patient. Her husband stated that she was unavailable and to try back in an hour.

## 2018-07-04 NOTE — Telephone Encounter (Signed)
Patient called and is willing to wear the monitor. Order has been placed and scheduling has been notified.

## 2018-07-05 ENCOUNTER — Telehealth: Payer: Self-pay | Admitting: Cardiovascular Disease

## 2018-07-05 NOTE — Telephone Encounter (Signed)
Lmov for patient to call and schedule appointment ° °

## 2018-07-05 NOTE — Telephone Encounter (Signed)
-----   Message from Ricci Barker, RN sent at 07/04/2018 10:25 AM EDT ----- Regarding: Zio monitor Hello....this patient needs to be scheduled for a Zio monitor placement. Thank you

## 2018-07-07 NOTE — Telephone Encounter (Signed)
Scheduled 8/12

## 2018-07-10 ENCOUNTER — Ambulatory Visit (INDEPENDENT_AMBULATORY_CARE_PROVIDER_SITE_OTHER): Payer: Medicare Other

## 2018-07-10 DIAGNOSIS — I480 Paroxysmal atrial fibrillation: Secondary | ICD-10-CM | POA: Diagnosis not present

## 2018-07-11 ENCOUNTER — Telehealth: Payer: Self-pay | Admitting: Cardiovascular Disease

## 2018-07-11 NOTE — Telephone Encounter (Signed)
Please see note below regarding refill for Eliquis, Thanks !

## 2018-07-11 NOTE — Telephone Encounter (Signed)
°*  STAT* If patient is at the pharmacy, call can be transferred to refill team. ° ° °1. Which medications need to be refilled? (please list name of each medication and dose if known) Eliquis 5 MG  ° °2. Which pharmacy/location (including street and city if local pharmacy) is medication to be sent to? [facility] on [address]  ° °3. Do they need a 30 day or 90 day supply? 90 day  ° ° °

## 2018-07-11 NOTE — Telephone Encounter (Signed)
Please review for refill, Thanks !  

## 2018-07-12 MED ORDER — APIXABAN 5 MG PO TABS: 5.0000 mg | ORAL_TABLET | Freq: Two times a day (BID) | ORAL | 0 refills | Status: DC

## 2018-07-12 NOTE — Telephone Encounter (Signed)
Pt's wt 67.7 kg, age 84, serum creatinine 0.83, CrCl 0.83.  Eliquis 5 mg BID, #180 w/ 0 refills sent to [facility].

## 2018-08-01 ENCOUNTER — Telehealth: Payer: Self-pay | Admitting: Cardiovascular Disease

## 2018-08-01 DIAGNOSIS — I4891 Unspecified atrial fibrillation: Secondary | ICD-10-CM

## 2018-08-01 NOTE — Telephone Encounter (Signed)
Patient calling to set up new patient appt with Caryl Comes per Dr. Rockey Situ for afib s/p zio report.  Please enter referral for scheduling if needed.     Patient wants to be seen in [place] office sooner availability will fwd to [facility] for scheduling after referral placed.

## 2018-08-01 NOTE — Telephone Encounter (Signed)
Referral entered. Thanks for scheduling.

## 2018-08-07 ENCOUNTER — Encounter: Payer: Self-pay | Admitting: Internal Medicine

## 2018-08-08 ENCOUNTER — Encounter: Payer: Self-pay | Admitting: Internal Medicine

## 2018-08-08 ENCOUNTER — Ambulatory Visit (INDEPENDENT_AMBULATORY_CARE_PROVIDER_SITE_OTHER): Payer: Medicare Other | Admitting: Internal Medicine

## 2018-08-08 VITALS — BP 122/68 | HR 56 | Ht 61.0 in | Wt 148.6 lb

## 2018-08-08 DIAGNOSIS — I4892 Unspecified atrial flutter: Secondary | ICD-10-CM | POA: Diagnosis not present

## 2018-08-08 DIAGNOSIS — I480 Paroxysmal atrial fibrillation: Secondary | ICD-10-CM | POA: Diagnosis not present

## 2018-08-08 DIAGNOSIS — M353 Polymyalgia rheumatica: Secondary | ICD-10-CM | POA: Diagnosis not present

## 2018-08-08 NOTE — Patient Instructions (Signed)
Medication Instructions:  Your physician has recommended you make the following change in your medication:   1. Stop Atenolol 2. Stop Amiodarone  Labwork:  None ordered.  Testing/Procedures: None ordered.  Follow-Up: Your physician recommends that you schedule a follow-up appointment in: 2-3 months with Dr Caryl Comes in [place].  Any Other Special Instructions Will Be Listed Below (If Applicable).     If you need a refill on your cardiac medications before your next appointment, please call your pharmacy.

## 2018-08-08 NOTE — Progress Notes (Signed)
ELECTROPHYSIOLOGY CONSULT NOTE  Patient ID: Amanda Dalton, MRN: [id number], DOB/AGE: 1931-04-19 84 y.o. Admit date: (Not on file) Date of Consult: 08/08/2018  Primary Physician: Amanda Pink, MD Primary Cardiologist: Amanda Dalton     Amanda Dalton is a 84 y.o. female who is being seen today for the evaluation of Afib at the request of TG.    HPI Amanda Dalton is a 84 y.o. female  Referred for atrial arrhythmia  Initially presenting 2017 following a "fun night party "where she had had plenty to drink.  She was asymptomatic when seen by her PCP.  Referred to cardiology.  ECG demonstrated slow atrial flutter with 2: 1 conduction.  She reverted spontaneously.   She was found to be in atrial fibrillation 5/19 again with spontaneous conversion. She was treated at that point with atenolol and amiodarone.  She previously not tolerated metoprolol because of fatigue.  She is continued to have worsening symptoms since the initiation of this combination of medications.  On either occasion when she aware of her atrial fibrillation is with palpitations or impaired exercise intolerance  She underwent 0 monitoring morning however you atrial flutter/fibrillation frequent with rates ranging 47--118.  With mean about 75   Also apixoban   Thromboembolic risk factors ( age -34, Gender-1 ) for a CHADSVASc Score of 3   . DATE TEST EF   1/18 Echo   50-55 % LAE mild PA 50-37mm          Past Medical History:  Diagnosis Date  . Anxiety   . Asthma   . Atrial flutter ([place])    a. Dx 11/17/2016-->CHA2DS2VASc = 3-->Eliquis 5 mg BID.  Marland Kitchen Breast cancer ([place])    a. 1996 s/p R modified radical mastectomy-->chemo w/ tamoxifen.  . Endometrial cancer ([place])    a. 08/2001 s/p hysterectomy. No node involvement.  . Fracture of right humerus 11/2008  . Hypothyroidism   . Osteoporosis       Surgical History:  Past Surgical History:  Procedure Laterality Date  . CARDIOVERSION N/A 04/19/2018   Procedure:  CARDIOVERSION;  Surgeon: Minna Merritts, MD;  Location: [facility] ORS;  Service: Cardiovascular;  Laterality: N/A;  . Right Mastectomy  1996  . VAGINAL HYSTERECTOMY  08/2001     Home Meds: Prior to Admission medications   Medication Sig Start Date End Date Taking? Authorizing Provider  albuterol (PROAIR HFA) 108 (90 Base) MCG/ACT inhaler Inhale 1-2 puffs into the lungs every 6 (six) hours as needed for wheezing or shortness of breath. 12/27/17   Juanito Doom, MD  amiodarone (PACERONE) 200 MG tablet Take 0.5 tablets (100 mg total) by mouth daily. 05/31/18   Minna Merritts, MD  apixaban (ELIQUIS) 5 MG TABS tablet Take 1 tablet (5 mg total) by mouth 2 (two) times daily. 07/12/18   Minna Merritts, MD  atenolol (TENORMIN) 25 MG tablet Take 0.5 tablets (12.5 mg total) by mouth every evening. 05/31/18 05/31/19  Minna Merritts, MD  Calcium Carb-Cholecalciferol (CALCIUM-VITAMIN D) 600-400 MG-UNIT TABS Take 1 tablet by mouth daily.     [provider]  cetirizine (ZYRTEC) 10 MG tablet Take 10 mg by mouth daily.     [provider]  Cholecalciferol (VITAMIN D) 2000 units CAPS Take 2,000 Units by mouth daily.     [provider]  citalopram (CELEXA) 40 MG tablet Take 40 mg by mouth every evening.     [provider]  Flaxseed, Linseed, (FLAX SEED  OIL PO) Take 1 capsule by mouth daily.    [provider]  levothyroxine (SYNTHROID, LEVOTHROID) 75 MCG tablet Take 75 mcg by mouth daily before breakfast.    [provider]  magnesium oxide (MAG-OX) 400 MG tablet Take 400 mg by mouth daily.    [provider]  montelukast (SINGULAIR) 10 MG tablet Take 10 mg by mouth every evening.     [provider]  Multiple Vitamin (MULTIVITAMIN WITH MINERALS) TABS tablet Take 1 tablet by mouth daily.    [provider]  Probiotic Product (PROBIOTIC PO) Take 1 capsule by mouth daily.    [provider]  vitamin C (ASCORBIC ACID) 500  MG tablet Take 500 mg by mouth daily.    [provider]    Allergies:  Allergies  Allergen Reactions  . Paxil [Paroxetine Hcl] Nausea Only    Social History   Socioeconomic History  . Marital status: Married    Spouse name: Not on file  . Number of children: Not on file  . Years of education: Not on file  . Highest education level: Not on file  Occupational History  . Occupation: Retired  Scientific laboratory technician  . Financial resource strain: Not on file  . Food insecurity:    Worry: Not on file    Inability: Not on file  . Transportation needs:    Medical: Not on file    Non-medical: Not on file  Tobacco Use  . Smoking status: Never Smoker  . Smokeless tobacco: Never Used  Substance and Sexual Activity  . Alcohol use: Yes    Alcohol/week: 2.0 standard drinks    Types: 2 Glasses of wine per week    Comment: per day  . Drug use: No  . Sexual activity: Never  Lifestyle  . Physical activity:    Days per week: Not on file    Minutes per session: Not on file  . Stress: Not on file  Relationships  . Social connections:    Talks on phone: Not on file    Gets together: Not on file    Attends religious service: Not on file    Active member of club or organization: Not on file    Attends meetings of clubs or organizations: Not on file    Relationship status: Not on file  . Intimate partner violence:    Fear of current or ex partner: Not on file    Emotionally abused: Not on file    Physically abused: Not on file    Forced sexual activity: Not on file  Other Topics Concern  . Not on file  Social History Narrative   Lives locally with husband - [place]     Family History  Problem Relation Age of Onset  . Breast cancer Sister   . Osteoporosis Sister   . Stroke Mother        died @ 58  . Hypertension Mother   . Stroke Father        died @ 19     ROS:  Please see the history of present illness.     All other systems reviewed and negative.    Physical  Exam: Blood pressure 122/68, pulse (!) 56, height 5\' 1"  (1.549 m), weight 148 lb 9.6 oz (67.4 kg), SpO2 93 %. General: Well developed, well nourished female in no acute distress. Head: Normocephalic, atraumatic, sclera non-icteric, no xanthomas, nares are without discharge. EENT: normal  Lymph Nodes:  none Neck: Negative  for carotid bruits. JVD not elevated. Back:without scoliosis kyphosis Lungs: Clear bilaterally to auscultation without wheezes, rales, or rhonchi. Breathing is unlabored. Heart: Slow RRR with S1 S2. No  murmur . No rubs, or gallops appreciated. Abdomen: Soft, non-tender, non-distended with normoactive bowel sounds. No hepatomegaly. No rebound/guarding. No obvious abdominal masses. Msk:  Strength and tone appear normal for age. Extremities: No clubbing or cyanosis. No edema.  Distal pedal pulses are 2+ and equal bilaterally. Skin: Warm and Dry Neuro: Alert and oriented X 3. CN III-XII intact Grossly normal sensory and motor function . Psych:  Responds to questions appropriately with a normal affect.      Labs: Cardiac Enzymes No results for input(s): CKTOTAL, CKMB, TROPONINI in the last 72 hours. CBC Lab Results  Component Value Date   WBC 7.5 04/11/2018   HGB 12.4 04/11/2018   HCT 37.2 04/11/2018   MCV 97 04/11/2018   PLT 275 04/11/2018   PROTIME: No results for input(s): LABPROT, INR in the last 72 hours. Chemistry No results for input(s): NA, K, CL, CO2, BUN, CREATININE, CALCIUM, PROT, BILITOT, ALKPHOS, ALT, AST, GLUCOSE in the last 168 hours.  Invalid input(s): LABALBU Lipids No results found for: CHOL, HDL, LDLCALC, TRIG BNP No results found for: PROBNP Thyroid Function Tests: No results for input(s): TSH, T4TOTAL, T3FREE, THYROIDAB in the last 72 hours.  Invalid input(s): FREET3 Miscellaneous No results found for: DDIMER  Radiology/Studies:  No results found.  EKG: sinus @ 56 17/08/48   Assessment and Plan:  Atrial  fibrillation-paroxysmal  Bradycardia-sinus  Leg aches    The patient has infrequent episodes of atrial fibrillation which have been asymptomatic.  Is her impression at the amiodarone and the atenolol have left her worse off.  Given the paucity of symptoms, I suggested we stop these medications.  She also has sinus bradycardia which may be ameliorated by discontinuation of these meds.  In the event that she has symptomatic tachycardia, we can address that subsequently.  We will continue her on her anticoagulation; have discussed with her and her husband the issues of thromboembolic risk  With her leg aches, we will check a sed rate for polymyalgia rheumatica     Virl Axe

## 2018-08-09 LAB — SEDIMENTATION RATE: SED RATE: 5 mm/h (ref 0–40)

## 2018-08-30 IMAGING — CT CT MAXILLOFACIAL W/O CM
4 of 6 series · 15 of 47 positions shown, 17 images · non-contrast
Comparison: CT of the head performed 06/14/2015

CLINICAL DATA: Status post fall, with abrasions to the face and
nose. Initial encounter.

EXAM:
CT HEAD WITHOUT CONTRAST
CT MAXILLOFACIAL WITHOUT CONTRAST
TECHNIQUE: Multidetector CT imaging of the head and maxillofacial structures
were performed using the standard protocol without intravenous
contrast. Multiplanar CT image reconstructions of the maxillofacial
structures were also generated.

[Series 3: head wo · axial · 0.44mm/px · z∈[-24,+76]mm · 6 of 30 slices shown, 8 images]
[im 5/30  brain]
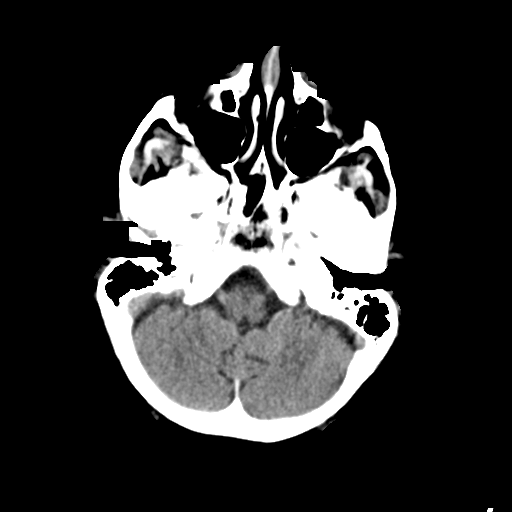
[im 5/30  bone]
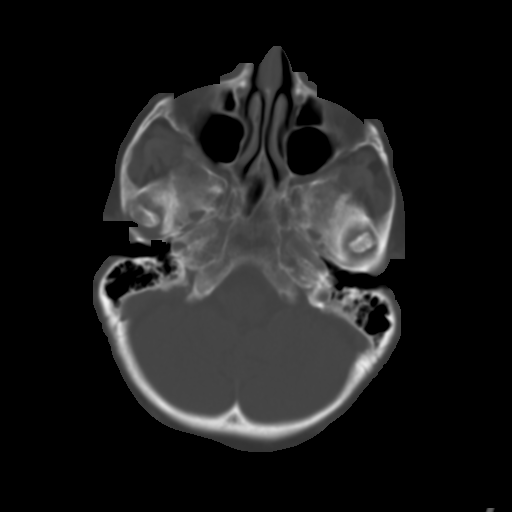
[im 9/30  bone]
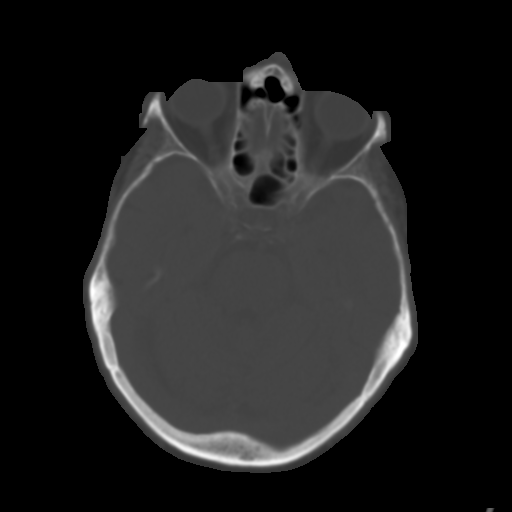
[im 13/30  bone]
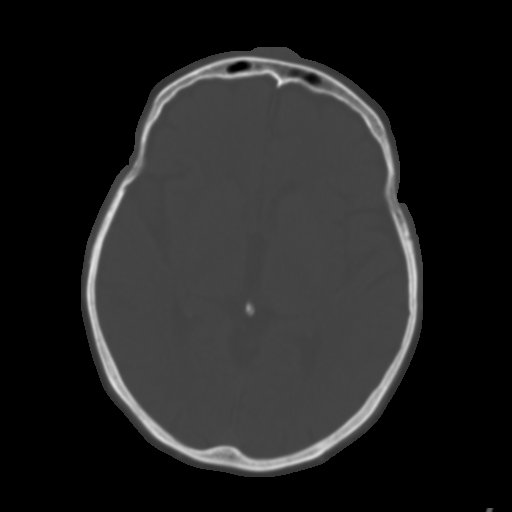
[im 17/30  bone]
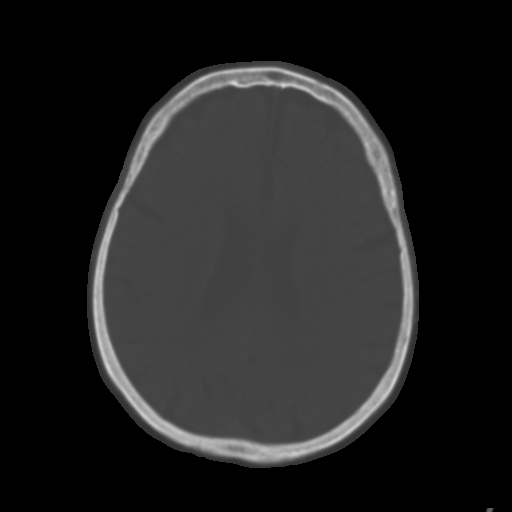
[im 21/30  brain]
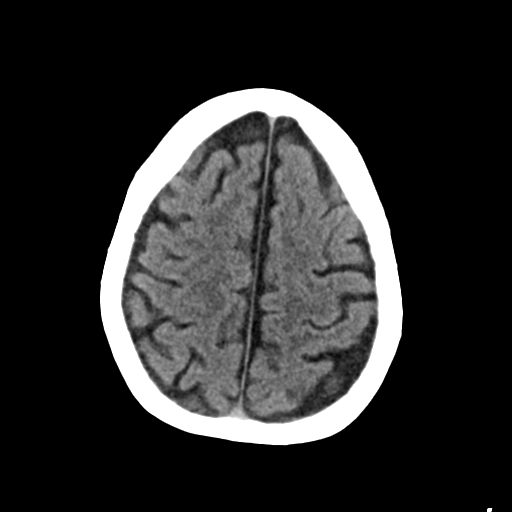
[im 21/30  bone]
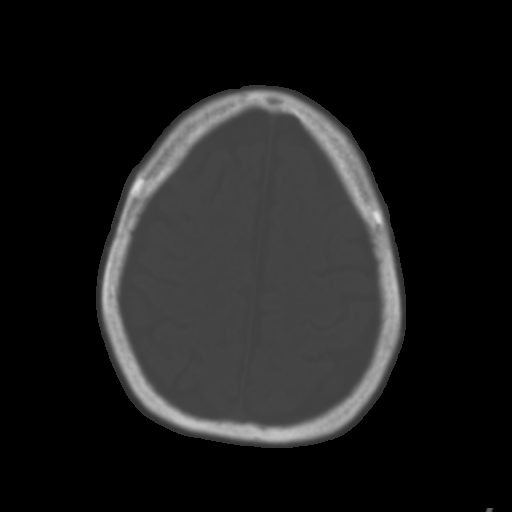
[im 25/30  bone]
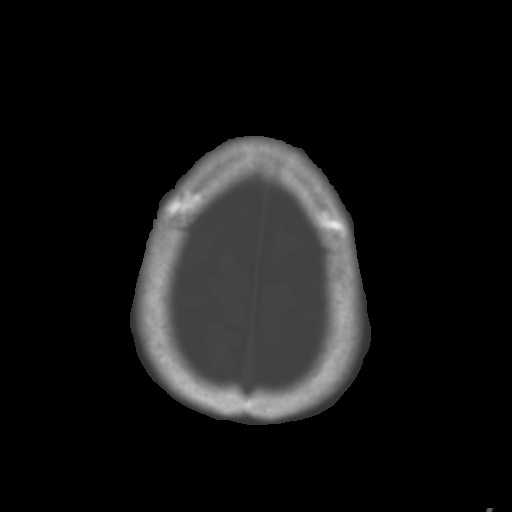

[Series 5: coronal soft tissue · coronal · 0.29mm/px · 3 of 64 slices shown]
[im 16/64  bone]
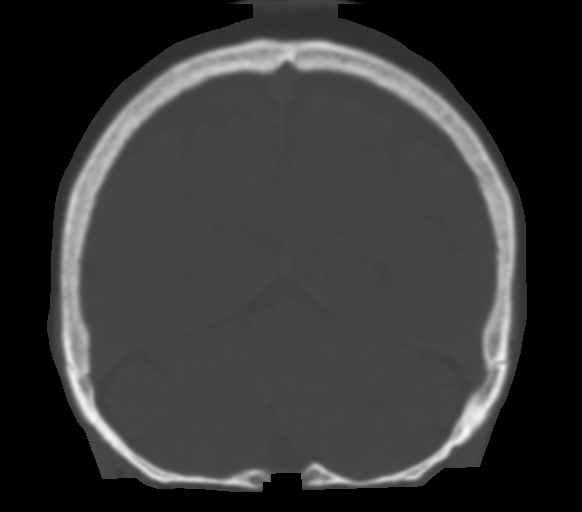
[im 32/64  bone]
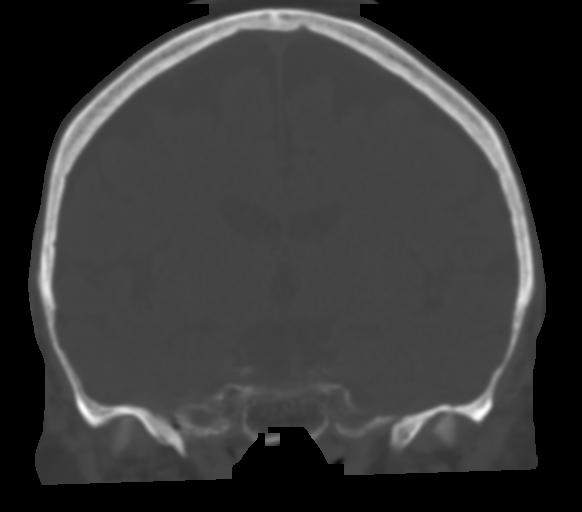
[im 48/64  bone]
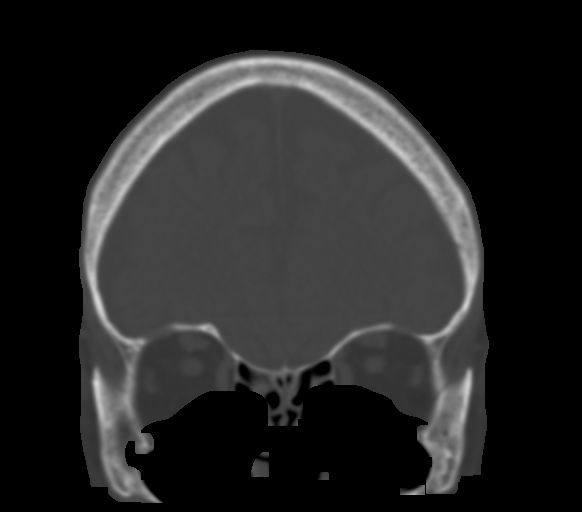

[Series 7: max soft · axial · 0.36mm/px · z∈[-108,-58]mm · 4 of 76 slices shown]
[im 8/76  brain]
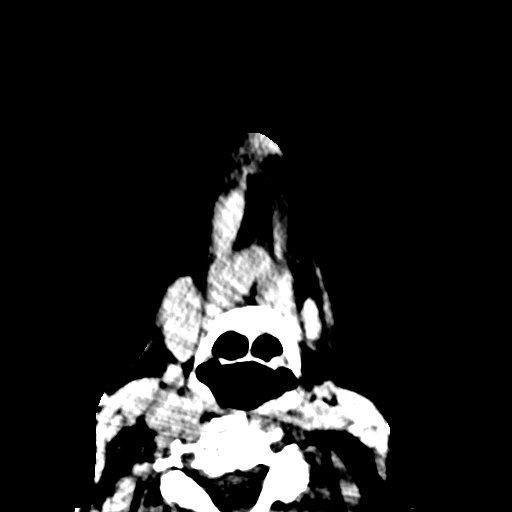
[im 15/76  brain]
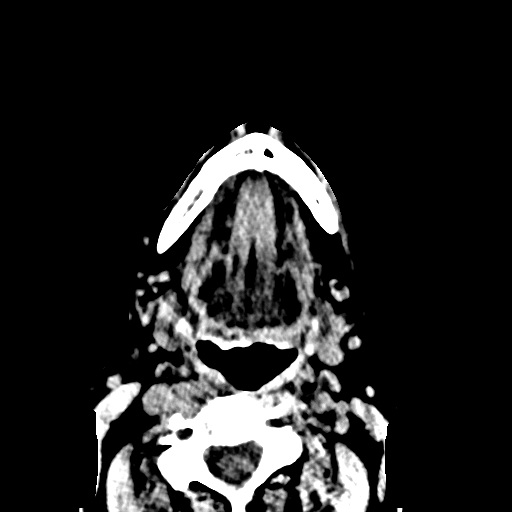
[im 26/76  brain]
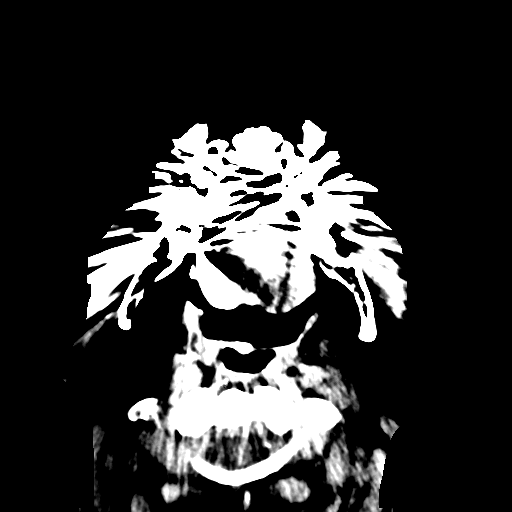
[im 33/76  brain]
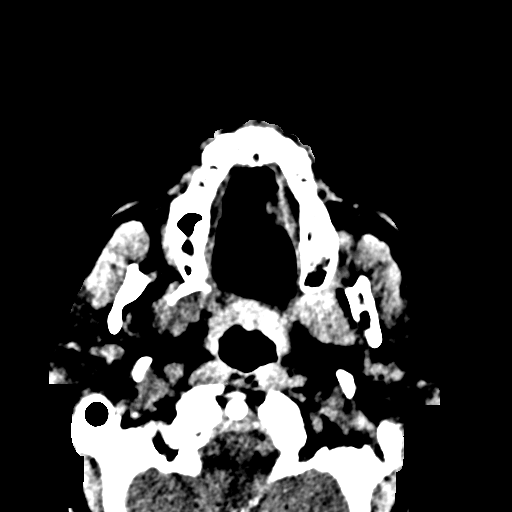

[Series 13: sagittal soft · sagittal · 0.31mm/px · 2 of 81 slices shown]
[im 27/81  bone]
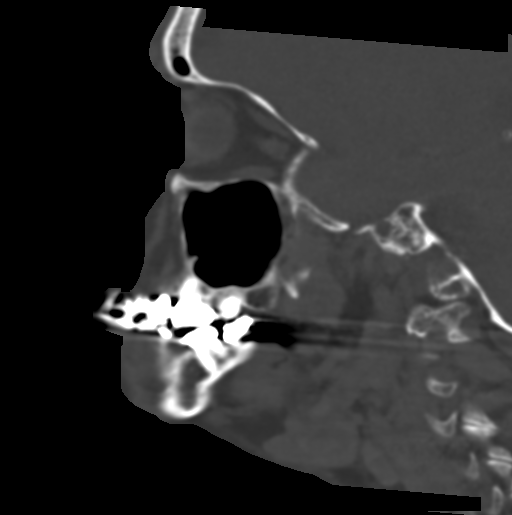
[im 54/81  bone]
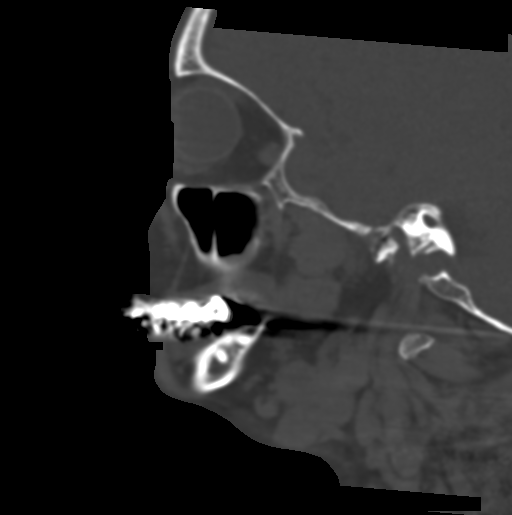

[15 of 47 positions shown; findings below may reference images not displayed]

FINDINGS: CT HEAD FINDINGS

Brain: No evidence of acute infarction, hemorrhage, hydrocephalus,
extra-axial collection or mass lesion/mass effect.

Prominence of the ventricles and sulci reflects mild to moderate
cortical volume loss. Mild cerebellar atrophy is noted. Mild
periventricular white matter change likely reflects small vessel
ischemic microangiopathy.

The brainstem and fourth ventricle are within normal limits. The
basal ganglia are unremarkable in appearance. The cerebral
hemispheres demonstrate grossly normal gray-white differentiation.
No mass effect or midline shift is seen.

Vascular: No hyperdense vessel or unexpected calcification.

Skull: There is no evidence of fracture; visualized osseous
structures are unremarkable in appearance.

Other: Soft tissue swelling and laceration is noted overlying the
frontal calvarium.

CT MAXILLOFACIAL FINDINGS

Osseous: There is no evidence of fracture or dislocation. The
maxilla and mandible appear intact. The nasal bone is unremarkable
in appearance. The visualized dentition demonstrates no acute
abnormality.

Degenerative change is noted at the temporomandibular joints
bilaterally, with flattening of the mandibular condylar heads.

Orbits: The orbits are intact bilaterally.

Sinuses: Mucosal thickening is noted at the left maxillary sinus.
The remaining visualized paranasal sinuses and mastoid air cells are
well-aerated.

Soft tissues: Soft tissue swelling is noted overlying the frontal
calvarium, with associated laceration. Soft tissue swelling extends
over the nose.

The parapharyngeal fat planes are preserved. The nasopharynx,
oropharynx and hypopharynx are unremarkable in appearance. The
visualized portions of the valleculae and piriform sinuses are
grossly unremarkable.

The parotid and submandibular glands are within normal limits. No
cervical lymphadenopathy is seen.
IMPRESSION: 1. No evidence of traumatic intracranial injury or fracture.
2. No evidence of fracture or dislocation with regard to the
maxillofacial structures.
3. Soft tissue swelling and laceration overlying the frontal
calvarium. Soft tissue swelling extends over the nose.
4. Mild to moderate cortical volume loss and scattered small vessel
ischemic microangiopathy.
5. Degenerative change at the temporomandibular joints bilaterally,
with flattening of the mandibular condylar heads.
6. Mucosal thickening at the left maxillary sinus.

## 2018-09-07 ENCOUNTER — Ambulatory Visit: Payer: Medicare Other | Admitting: Internal Medicine

## 2018-09-08 ENCOUNTER — Other Ambulatory Visit: Payer: Self-pay | Admitting: Cardiovascular Disease

## 2018-09-11 NOTE — Telephone Encounter (Signed)
Please review for refill.  

## 2018-09-14 ENCOUNTER — Telehealth: Payer: Self-pay | Admitting: Internal Medicine

## 2018-09-14 NOTE — Telephone Encounter (Signed)
Per Dr Caryl Comes, He would like for pt to come by the [facility] office tomorrow (10/18) for a RN visit and EKG. To determine if she is in AF. Will forward message back to [facility] triage.

## 2018-09-14 NOTE — Telephone Encounter (Signed)
STAT if HR is under 50 or over 120 (normal HR is 60-100 beats per minute)  1) What is your heart rate? 123  2) Do you have a log of your heart rate readings (document readings)?  Last night 123 Last night/This morning ~ 130  3) Do you have any other symptoms? Not really, patient is on a antibiotic, patient's BP is normal

## 2018-09-14 NOTE — Telephone Encounter (Signed)
Spoke with patient and she said that her pulse has been up to 132 last night and today it has been in the 120's. She states that she has been on antibiotics for 2 weeks now with sinusitis and not feeling well. She has not been using the inhaler and using a nasal spray. She states that she was feeling better when off of the medications that were stopped recently with Dr. Caryl Comes but now her heart rates are higher. She states that it does not get any lower than 100's. So will route this over to Dr. Caryl Comes and his nurse for review and further recommendations. She verbalized understanding with no further questions at this time.

## 2018-09-14 NOTE — Telephone Encounter (Signed)
I called and spoke with the patient. She is aware of Dr. Olin Pia recommendations to come in the office in the morning at 8:00 am for an EKG. She states her HR's this afternoon have been from the 80-90's.  She thinks her antibiotic she has been taking for a sinus infection has caused her heart "to do this." I advised the stress on her body from the infection may have triggered this for her and she has been off atenolol and amiodarone since 08/08/18.  Will review her EKG in the morning and discuss with the DOD in office if needed.

## 2018-09-15 ENCOUNTER — Ambulatory Visit (INDEPENDENT_AMBULATORY_CARE_PROVIDER_SITE_OTHER): Payer: Medicare Other | Admitting: *Deleted

## 2018-09-15 ENCOUNTER — Encounter: Payer: Self-pay | Admitting: *Deleted

## 2018-09-15 VITALS — BP 140/82 | HR 58 | Ht 61.0 in | Wt 148.8 lb

## 2018-09-15 DIAGNOSIS — I480 Paroxysmal atrial fibrillation: Secondary | ICD-10-CM

## 2018-09-15 NOTE — Patient Instructions (Signed)
Medication Instructions:  - Your physician recommends that you continue on your current medications as directed. Please refer to the Current Medication list given to you today.  If you need a refill on your cardiac medications before your next appointment, please call your pharmacy.   Lab work: - none ordered  If you have labs (blood work) drawn today and your tests are completely normal, you will receive your results only by: Marland Kitchen MyChart Message (if you have MyChart) OR . A paper copy in the mail If you have any lab test that is abnormal or we need to change your treatment, we will call you to review the results.  Testing/Procedures: - none ordered  Follow-Up: At [facility], you and your health needs are our priority.  As part of our continuing mission to provide you with exceptional heart care, we have created designated Provider Care Teams.  These Care Teams include your primary Cardiologist (physician) and Advanced Practice Providers (APPs -  Physician Assistants and Nurse Practitioners) who all work together to provide you with the care you need, when you need it. . Tuesday 09/19/18 at 8:45 am with Dr. Caryl Comes  Any Other Special Instructions Will Be Listed Below (If Applicable). - N/A

## 2018-09-15 NOTE — Progress Notes (Signed)
1.) Reason for visit: EKG  2.) Name of MD requesting visit: Caryl Comes  3.) H&P: The patient was seen by Dr. Caryl Comes on 08/08/18 at the request of Dr.Gollan for PAF documented on her ZIO monitor. She was taking atenolol and amiodarone, but had complaints at her office visit that she felt worse taking these 2 medications. Per Dr. Caryl Comes, as the patient had been asymptomatic with her a-fib, amiodarone and atenolol were stopped at her 08/08/18 office visit. The patient called the office yesterday with complaints of seeing heart rates in the 120-130's so she was asked by Dr. Caryl Comes to come in to the office today for an EKG to confirm her rate/ rhythm.   4.) ROS related to problem: The patient states today that she does feel better off of the atenolol and amiodarone. However, she has been seeing HR's from the 40's- 130's intermittently. She feels that this may be due to antibiotics she has been taking recently. Per the patient, she was treated with amoxicillin starting ~9/27 for a sinus infection and acute bronchitis. This did not clear all of her symptoms, so she was placed on doxycycline and a "cough medicine." She is due to take her last dose of doxycycline today. I inquired from the patient if she has felt more SOB at rest/ with activity when her HR's have been in the 130's. She states "I don't notice a difference." EKG obtained today. The patient is currently having sinus bradycardia with a HR of 58 bpm. I did watch her on the monitor for several minutes and only noticed some occasional PAC's & PVC's. The patient states she was aware she had had these before.   5.) Assessment and plan per MD: EKG/ patient's symptoms reviewed via phone with Dr. Caryl Comes. Per Dr. Caryl Comes, if the patient is currently asymptomatic, then he would like to see her in the office next week to reevaluate her course of treatment as she is having bradycardia as well as possible a-fib w/ RVR. I have notified the patient and her husband of this and she  is agreeable to seeing Dr. Caryl Comes next week. She is currently scheduled for 09/19/18 at 8:45 am. The patient did inquire about a flu shot today- reviewed with Dr. Caryl Comes- advised may be best to wait for her sinus symptoms/ cough to resolve. I have advised her to check with her PCP and let him know she is due to finish her doxycycline today, but if minimal symptoms persist, would proceeding with the flu vaccine be ok. She is aware we can reevaluate her on Tuesday and if Dr. Kary Kos feels she would be ok, then we can give it to her on Tuesday. She voices understanding of all of the above and is agreeable.

## 2018-09-18 NOTE — Progress Notes (Signed)
Patient Care Team: Maryland Pink, MD as PCP - General (Family Medicine)   HPI  Amanda Dalton is a 84 y.o. female Seen because of recurrent palps with HR >110-120s; indeed, not really palps, but she has been using home monitor and has noted the increased HR, mostly without accompanying symptoms  These have veen assoc with scant symptoms; maybe some palps, but no LH DOE.    At last visit we had stopped amiodarone and atenolol and she has felt considerably better  She was found to be in atrial fibrillation 5/19 again with spontaneous conversion. She was treated at that point with atenolol and amiodarone.  She previously not tolerated metoprolol because of fatigue.  She is continued to have worsening symptoms since the initiation of this combination of medications.  On either occasion when she aware of her atrial fibrillation is with palpitations or impaired exercise intolerance  Monitoring>> atrial flutter/fibrillation frequent with rates ranging 47--118.  With mean about 75   Date Cr Hgb  5/19 0.83 12.4          Thromboembolic risk factors ( age -59, Gender-1 ) for a CHADSVASc Score of 3   . DATE TEST EF   1/18 Echo   50-55 % LAE mild PA 50-29mm          Records and Results Reviewed   Past Medical History:  Diagnosis Date  . Anxiety   . Asthma   . Atrial flutter ([place])    a. Dx 11/17/2016-->CHA2DS2VASc = 3-->Eliquis 5 mg BID.  Marland Kitchen Breast cancer ([place])    a. 1996 s/p R modified radical mastectomy-->chemo w/ tamoxifen.  . Endometrial cancer ([place])    a. 08/2001 s/p hysterectomy. No node involvement.  . Fracture of right humerus 11/2008  . Hypothyroidism   . Osteoporosis     Past Surgical History:  Procedure Laterality Date  . CARDIOVERSION N/A 04/19/2018   Procedure: CARDIOVERSION;  Surgeon: Minna Merritts, MD;  Location: [facility] ORS;  Service: Cardiovascular;  Laterality: N/A;  . Right Mastectomy  1996  . VAGINAL HYSTERECTOMY  08/2001     Current Meds  Medication Sig  . albuterol (PROAIR HFA) 108 (90 Base) MCG/ACT inhaler Inhale 1-2 puffs into the lungs every 6 (six) hours as needed for wheezing or shortness of breath.  Marland Kitchen apixaban (ELIQUIS) 5 MG TABS tablet Take 1 tablet (5 mg total) by mouth 2 (two) times daily.  . Calcium Carb-Cholecalciferol (CALCIUM-VITAMIN D) 600-400 MG-UNIT TABS Take 1 tablet by mouth daily.   . cetirizine (ZYRTEC) 10 MG tablet Take 10 mg by mouth daily.   . citalopram (CELEXA) 40 MG tablet Take 40 mg by mouth every evening.   . Flaxseed, Linseed, (FLAX SEED OIL PO) Take 1 capsule by mouth daily.  Marland Kitchen levothyroxine (SYNTHROID, LEVOTHROID) 75 MCG tablet Take 75 mcg by mouth daily before breakfast.  . magnesium oxide (MAG-OX) 400 MG tablet Take 400 mg by mouth daily.  . montelukast (SINGULAIR) 10 MG tablet Take 10 mg by mouth every evening.   . Multiple Vitamin (MULTIVITAMIN WITH MINERALS) TABS tablet Take 1 tablet by mouth daily.  . vitamin C (ASCORBIC ACID) 500 MG tablet Take 500 mg by mouth daily.    Allergies  Allergen Reactions  . Paxil [Paroxetine Hcl] Nausea Only      Review of Systems negative except from HPI and PMH  Physical Exam BP 100/60 (BP Location: Left Arm, Patient Position: Sitting, Cuff Size: Normal)   Pulse 70  Ht 5\' 1"  (1.549 m)   Wt 148 lb 8 oz (67.4 kg)   BMI 28.06 kg/m  Well developed and well nourished in no acute distress HENT normal E scleral and icterus clear Neck Supple JVP flat; carotids brisk and full Clear to ausculation Regular rate and rhythm, not t with active bowel sounds No clubbing cyanosis  Edema Alert and oriented, grossly normal motor and sensory function Skin Warm and Dry  ECG Personally reviewed  Sinus@ 70 15/08/42  Assessment and  Plan  Atrial fibrillation-paroxysmal  Bradycardia-sinus  On Anticoagulation;  No bleeding issues   Without signif assoc symptoms, have encouraged her to use her monitor infrequently and atenolol for  symptoms  Continue apixoban  Sinus brady is much improved off atenolol and amiodarone  We spent more than 50% of our >25 min visit in face to face counseling regarding the above         Current medicines are reviewed at length with the patient today .  The patient does not  have concerns regarding medicines.

## 2018-09-19 ENCOUNTER — Encounter: Payer: Self-pay | Admitting: Internal Medicine

## 2018-09-19 ENCOUNTER — Ambulatory Visit (INDEPENDENT_AMBULATORY_CARE_PROVIDER_SITE_OTHER): Payer: Medicare Other | Admitting: Internal Medicine

## 2018-09-19 VITALS — BP 100/60 | HR 70 | Ht 61.0 in | Wt 148.5 lb

## 2018-09-19 DIAGNOSIS — I480 Paroxysmal atrial fibrillation: Secondary | ICD-10-CM

## 2018-09-19 DIAGNOSIS — Z23 Encounter for immunization: Secondary | ICD-10-CM

## 2018-09-19 DIAGNOSIS — R001 Bradycardia, unspecified: Secondary | ICD-10-CM

## 2018-09-19 MED ORDER — ATENOLOL 25 MG PO TABS: ORAL_TABLET | ORAL | 3 refills | Status: DC

## 2018-09-19 NOTE — Patient Instructions (Addendum)
Medication Instructions:  - Your physician has recommended you make the following change in your medication:   1)  Atenolol 25 mg- take 1/2 tablet (12.5 mg) by mouth once daily for symptoms related to fast heart rates  If you need a refill on your cardiac medications before your next appointment, please call your pharmacy.   Lab work: - none ordered  If you have labs (blood work) drawn today and your tests are completely normal, you will receive your results only by: Marland Kitchen MyChart Message (if you have MyChart) OR . A paper copy in the mail If you have any lab test that is abnormal or we need to change your treatment, we will call you to review the results.  Testing/Procedures: - none ordered  Follow-Up: At [facility], you and your health needs are our priority.  As part of our continuing mission to provide you with exceptional heart care, we have created designated Provider Care Teams.  These Care Teams include your primary Cardiologist (physician) and Advanced Practice Providers (APPs -  Physician Assistants and Nurse Practitioners) who all work together to provide you with the care you need, when you need it. . in 3 months with Dr. Caryl Comes  Any Other Special Instructions Will Be Listed Below (If Applicable). - Flu shot today

## 2018-10-12 ENCOUNTER — Ambulatory Visit: Payer: Medicare Other | Admitting: Internal Medicine

## 2018-11-13 ENCOUNTER — Ambulatory Visit: Payer: Medicare Other | Admitting: Cardiovascular Disease

## 2018-11-13 NOTE — Progress Notes (Signed)
Cardiology Office Note  Date:  11/14/2018   ID:  Amanda Dalton, DOB March 25, 1931, MRN [id number]  PCP:  Maryland Pink, MD   Chief Complaint  Patient presents with  . other    6 mo follow up. Medications reviewed verbally. "Doing Well"    HPI:  84 y/o ? with a h/o  hypothyroidism,  anxiety,  asthma,  atrial flutter rate 120 bpm, started on anticoagulation, the Toprol for rate control  previous episode of atrial flutter presented in the setting of attending a party 10/2016: fib flutter 11/2016: NSR Cardioversion cancelled 04/19/2018 who presents for  follow-up of her arrhythmia, atrial fibrillation  Maintaining NSR Rare palpitations 3x,  Has taken atenolol only 3 different times Denies any other episodes of tachycardia or palpitations concerning for arrhythmia  Previously taken off atenolol and amiodarone given her bradycardia Reports that she is active, no regular exercise program Trace lower extremity nonpitting edema  On Eliquis, no recent falls Prior history of GI bleeds, none recently  Lab work reviewed with her  total chol 235, LDL 126  EKG personally reviewed by myself on todays visit Showed normal sinus rhythm rate 65 bpm no significant ST or T wave changes  Other past medical history reviewed Cardioversion cancelled 04/19/2018 Was in NSR on amiodarone and atenolol on arrival to the hospital She had bradycardia and amiodarone decreased down to 100 mg daily atenolol down to 12.5 mg daily  Previously reported having dizziness  When in atrial fibrillation  "heavy head", fogginess Palpitations, Since she has maintained normal sinus rhythm, symptoms have improved  Previously received adenosine IV push for tachycardia revealing flutter waves CHA2DS2VASc of 3 (age/age/gender), started on eliquis 5 mg BID (creat 0.8 12/4, wt > 60 kg)  Echocardiogram 12/08/2016 Ejection fraction 50-55%, moderately elevated right heart pressures 50-55 mmHg  Had a fall Nov 2017: large  laceration on forehead  Now idea how long she was in atrial flutter   PMH:   has a past medical history of Anxiety, Asthma, Atrial flutter ([place]), Breast cancer ([place]), Endometrial cancer ([place]), Fracture of right humerus (11/2008), Hypothyroidism, and Osteoporosis.  PSH:    Past Surgical History:  Procedure Laterality Date  . CARDIOVERSION N/A 04/19/2018   Procedure: CARDIOVERSION;  Surgeon: Minna Merritts, MD;  Location: [facility] ORS;  Service: Cardiovascular;  Laterality: N/A;  . Right Mastectomy  1996  . VAGINAL HYSTERECTOMY  08/2001    Current Outpatient Medications  Medication Sig Dispense Refill  . albuterol (PROAIR HFA) 108 (90 Base) MCG/ACT inhaler Inhale 1-2 puffs into the lungs every 6 (six) hours as needed for wheezing or shortness of breath. 1 Inhaler 5  . apixaban (ELIQUIS) 5 MG TABS tablet Take 1 tablet (5 mg total) by mouth 2 (two) times daily. 180 tablet 0  . atenolol (TENORMIN) 25 MG tablet Take 1/2 tablet (12.5 mg) once daily as needed for symptoms related to fast heart rates 180 tablet 3  . Calcium Carb-Cholecalciferol (CALCIUM-VITAMIN D) 600-400 MG-UNIT TABS Take 1 tablet by mouth daily.     . cetirizine (ZYRTEC) 10 MG tablet Take 10 mg by mouth daily.     . citalopram (CELEXA) 40 MG tablet Take 40 mg by mouth every evening.     . Flaxseed, Linseed, (FLAX SEED OIL PO) Take 1 capsule by mouth daily.    Marland Kitchen levothyroxine (SYNTHROID, LEVOTHROID) 75 MCG tablet Take 75 mcg by mouth daily before breakfast.    . magnesium oxide (MAG-OX) 400 MG tablet Take 400 mg by mouth  daily.    . montelukast (SINGULAIR) 10 MG tablet Take 10 mg by mouth every evening.     . Multiple Vitamin (MULTIVITAMIN WITH MINERALS) TABS tablet Take 1 tablet by mouth daily.    . vitamin C (ASCORBIC ACID) 500 MG tablet Take 500 mg by mouth daily.     No current facility-administered medications for this visit.      Allergies:   Paxil [paroxetine hcl]   Social History:  The patient  reports that she has  never smoked. She has never used smokeless tobacco. She reports current alcohol use of about 2.0 standard drinks of alcohol per week. She reports that she does not use drugs.   Family History:   family history includes Breast cancer in her sister; Hypertension in her mother; Osteoporosis in her sister; Stroke in her father and mother.    Review of Systems: Review of Systems  Constitutional: Negative.   Respiratory: Negative.   Cardiovascular: Negative.   Gastrointestinal: Negative.   Musculoskeletal: Negative.        Gait instability  Neurological: Negative.   Psychiatric/Behavioral: Negative.   All other systems reviewed and are negative.    PHYSICAL EXAM: VS:  BP 126/74 (BP Location: Left Arm, Patient Position: Sitting, Cuff Size: Normal)   Pulse 65   Ht 5' (1.524 m)   Wt 147 lb (66.7 kg)   BMI 28.71 kg/m  , BMI Body mass index is 28.71 kg/m.  Constitutional:  oriented to person, place, and time. No distress.  HENT:  Head: Grossly normal Eyes:  no discharge. No scleral icterus.  Neck: No JVD, no carotid bruits  Cardiovascular: Regular rate and rhythm, no murmurs appreciated Pulmonary/Chest: Clear to auscultation bilaterally, no wheezes or rails Abdominal: Soft.  no distension.  no tenderness.  Musculoskeletal: Normal range of motion Neurological:  normal muscle tone. Coordination normal. No atrophy Skin: Skin warm and dry Psychiatric: normal affect, pleasant   Recent Labs: 04/11/2018: BUN 15; Creatinine, Ser 0.83; Hemoglobin 12.4; Platelets 275; Potassium 4.6; Sodium 138    Lipid Panel No results found for: CHOL, HDL, LDLCALC, TRIG    Wt Readings from Last 3 Encounters:  11/14/18 147 lb (66.7 kg)  09/19/18 148 lb 8 oz (67.4 kg)  09/15/18 148 lb 12 oz (67.5 kg)       ASSESSMENT AND PLAN:  Persistent atrial fibrillation Maintaining normal sinus rhythm, converted on her own in the past with amiodarone oral loading.  Now no longer on beta-blocker or amiodarone  secondary to bradycardia As maintain normal sinus rhythm History of sick sinus syndrome  Encounter for anticoagulation discussion and counseling Compliant with her eliquis No bleeding, no recent GI bleeds or falls  Anxiety Stable symptoms Managed by primary care  Hypothyroidism, unspecified type On thyroid supplement, stable  Hyperlipidemia Cholesterol high 235 Previously declined workup including CT coronary calcium scoring Does not want cholesterol medication  Hyperlipidemia Cholesterol 235 As above does not want medication management  Uncomplicated asthma, unspecified asthma severity, unspecified whether persistent Stable, no recent issues  Gait instability/risk of falls No recent falls, recommended regular walking program   Total encounter time more than 25 minutes  Greater than 50% was spent in counseling and coordination of care with the patient   Disposition:   F/U  12 month   Orders Placed This Encounter  Procedures  . EKG 12-Lead     Signed, Esmond Plants, M.D., Ph.D. 11/14/2018  [place], [place]

## 2018-11-14 ENCOUNTER — Ambulatory Visit (INDEPENDENT_AMBULATORY_CARE_PROVIDER_SITE_OTHER): Payer: Medicare Other | Admitting: Cardiovascular Disease

## 2018-11-14 ENCOUNTER — Encounter: Payer: Self-pay | Admitting: Cardiovascular Disease

## 2018-11-14 VITALS — BP 126/74 | HR 65 | Ht 60.0 in | Wt 147.0 lb

## 2018-11-14 DIAGNOSIS — M353 Polymyalgia rheumatica: Secondary | ICD-10-CM

## 2018-11-14 DIAGNOSIS — I4892 Unspecified atrial flutter: Secondary | ICD-10-CM | POA: Diagnosis not present

## 2018-11-14 DIAGNOSIS — E782 Mixed hyperlipidemia: Secondary | ICD-10-CM

## 2018-11-14 DIAGNOSIS — I480 Paroxysmal atrial fibrillation: Secondary | ICD-10-CM

## 2018-11-14 NOTE — Patient Instructions (Signed)

## 2018-12-14 ENCOUNTER — Encounter: Payer: Self-pay | Admitting: Internal Medicine

## 2018-12-14 ENCOUNTER — Ambulatory Visit (INDEPENDENT_AMBULATORY_CARE_PROVIDER_SITE_OTHER): Payer: Medicare Other | Admitting: Internal Medicine

## 2018-12-14 VITALS — BP 110/60 | HR 61 | Ht 59.0 in | Wt 148.2 lb

## 2018-12-14 DIAGNOSIS — I480 Paroxysmal atrial fibrillation: Secondary | ICD-10-CM

## 2018-12-14 DIAGNOSIS — R001 Bradycardia, unspecified: Secondary | ICD-10-CM

## 2018-12-14 NOTE — Patient Instructions (Signed)
Medication Instructions:  - Your physician recommends that you continue on your current medications as directed. Please refer to the Current Medication list given to you today.  If you need a refill on your cardiac medications before your next appointment, please call your pharmacy.   Lab work: - none ordered  If you have labs (blood work) drawn today and your tests are completely normal, you will receive your results only by: Marland Kitchen MyChart Message (if you have MyChart) OR . A paper copy in the mail If you have any lab test that is abnormal or we need to change your treatment, we will call you to review the results.  Testing/Procedures: - none ordered  Follow-Up: At [facility], you and your health needs are our priority.  As part of our continuing mission to provide you with exceptional heart care, we have created designated Provider Care Teams.  These Care Teams include your primary Cardiologist (physician) and Advanced Practice Providers (APPs -  Physician Assistants and Nurse Practitioners) who all work together to provide you with the care you need, when you need it. . You will need a follow up appointment in 2 months with Dr. Caryl Comes.  Please call our office at the end of this month to see if that schedule is available.  Any Other Special Instructions Will Be Listed Below (If Applicable). - N/A

## 2018-12-14 NOTE — Progress Notes (Signed)
Patient Care Team: Maryland Pink, MD as PCP - General (Family Medicine)   HPI  Amanda Dalton is a 85 y.o. female Seen because of recurrent palps with HR >110-120s; indeed, not really palps, but she has been using home monitor and has noted the increased HR, mostly without accompanying symptoms  Previously treated with atenolol and amiodarone and then discontinued with symptomatic improvement   Atrial fibrillation 5/19 again with spontaneous conversion.    On either occasion when she aware of her atrial fibrillation is with palpitations or impaired exercise intolerance    Monitoring>> atrial flutter/fibrillation frequent with rates ranging 47--118.  With mean about 75  She continues to have relatively infrequent symptoms and uses her atenolol on an as-needed basis.  She has noted modest exercise intolerance.  She has been much less active over the last year or so.  She saw Dr. Lake Bells 1/19 for abnormal PFTs with outflow obstruction.  Thought to be chronic asthma.   Date Cr Hgb  5/19 0.83 12.4  12/19  0.8      Thromboembolic risk factors ( age -15, Gender-1 ) for a CHADSVASc Score of 3   . DATE TEST EF   1/18 Echo   50-55 % LAE mild PA 50-77mm          Records and Results Reviewed   Past Medical History:  Diagnosis Date  . Anxiety   . Asthma   . Atrial flutter ([place])    a. Dx 11/17/2016-->CHA2DS2VASc = 3-->Eliquis 5 mg BID.  Marland Kitchen Breast cancer ([place])    a. 1996 s/p R modified radical mastectomy-->chemo w/ tamoxifen.  . Endometrial cancer ([place])    a. 08/2001 s/p hysterectomy. No node involvement.  . Fracture of right humerus 11/2008  . Hypothyroidism   . Osteoporosis     Past Surgical History:  Procedure Laterality Date  . CARDIOVERSION N/A 04/19/2018   Procedure: CARDIOVERSION;  Surgeon: Minna Merritts, MD;  Location: [facility] ORS;  Service: Cardiovascular;  Laterality: N/A;  . Right Mastectomy  1996  . VAGINAL HYSTERECTOMY  08/2001     Current Meds  Medication Sig  . albuterol (PROAIR HFA) 108 (90 Base) MCG/ACT inhaler Inhale 1-2 puffs into the lungs every 6 (six) hours as needed for wheezing or shortness of breath.  Marland Kitchen apixaban (ELIQUIS) 5 MG TABS tablet Take 1 tablet (5 mg total) by mouth 2 (two) times daily.  Marland Kitchen atenolol (TENORMIN) 25 MG tablet Take 1/2 tablet (12.5 mg) once daily as needed for symptoms related to fast heart rates  . Calcium Carb-Cholecalciferol (CALCIUM-VITAMIN D) 600-400 MG-UNIT TABS Take 1 tablet by mouth daily.   . cetirizine (ZYRTEC) 10 MG tablet Take 10 mg by mouth daily.   . citalopram (CELEXA) 40 MG tablet Take 40 mg by mouth every evening.   . Flaxseed, Linseed, (FLAX SEED OIL PO) Take 1 capsule by mouth daily.  Marland Kitchen levothyroxine (SYNTHROID, LEVOTHROID) 75 MCG tablet Take 75 mcg by mouth daily before breakfast.  . magnesium oxide (MAG-OX) 400 MG tablet Take 400 mg by mouth daily.  . montelukast (SINGULAIR) 10 MG tablet Take 10 mg by mouth every evening.   . Multiple Vitamin (MULTIVITAMIN WITH MINERALS) TABS tablet Take 1 tablet by mouth daily.  . vitamin C (ASCORBIC ACID) 500 MG tablet Take 500 mg by mouth daily.    Allergies  Allergen Reactions  . Paxil [Paroxetine Hcl] Nausea Only      Review of Systems negative except from HPI and PMH  Physical Exam BP 110/60 (BP Location: Left Arm, Patient Position: Sitting, Cuff Size: Normal)   Pulse 61   Ht 4\' 11"  (1.499 m)   Wt 148 lb 4 oz (67.2 kg)   BMI 29.94 kg/m  Well developed and nourished in no acute distress HENT normal Neck supple with JVP-flat Clear Regular rate and rhythm, no murmurs or gallops Abd-soft with active BS No Clubbing cyanosis edema Skin-warm and dry A & Oriented  Grossly normal sensory and motor function    ECG sinus at 61 Interval 17/08/44  Assessment and  Plan  Atrial fibrillation-paroxysmal  Bradycardia-sinus  Dyspnea on exertion  On Anticoagulation;  No bleeding issues   We will need to check  CBC at her next visit  Dyspnea is likely multifactorial.  She is to see Dr. Jacinto Reap McQ next month.  Her lungs were clear today.  We will get him to weigh in on her dyspnea and then undertake treadmill testing to assess chronotropic competence.  Continue using as needed atenolol for her paroxysms of atrial fibrillation.  Hopefully we can avoid daily medications.  We spent more than 50% of our >25 min visit in face to face counseling regarding the above     Current medicines are reviewed at length with the patient today .  The patient does not  have concerns regarding medicines.

## 2019-01-25 ENCOUNTER — Encounter: Payer: Self-pay | Admitting: Adult Health

## 2019-01-25 ENCOUNTER — Ambulatory Visit: Payer: Medicare Other | Admitting: Pulmonary Disease

## 2019-01-25 ENCOUNTER — Ambulatory Visit (INDEPENDENT_AMBULATORY_CARE_PROVIDER_SITE_OTHER): Payer: Medicare Other | Admitting: Adult Health

## 2019-01-25 VITALS — BP 124/70 | HR 73 | Ht 60.0 in | Wt 149.2 lb

## 2019-01-25 DIAGNOSIS — J4520 Mild intermittent asthma, uncomplicated: Secondary | ICD-10-CM | POA: Diagnosis not present

## 2019-01-25 DIAGNOSIS — J309 Allergic rhinitis, unspecified: Secondary | ICD-10-CM | POA: Diagnosis not present

## 2019-01-25 LAB — NITRIC OXIDE: NITRIC OXIDE: 6

## 2019-01-25 MED ORDER — MOMETASONE FUROATE 110 MCG/INH IN AEPB: 2.0000 | INHALATION_SPRAY | Freq: Every day | RESPIRATORY_TRACT | 5 refills | Status: DC

## 2019-01-25 MED ORDER — MOMETASONE FUROATE 220 MCG/INH IN AEPB: 2.0000 | INHALATION_SPRAY | Freq: Every day | RESPIRATORY_TRACT | 0 refills | Status: DC

## 2019-01-25 NOTE — Patient Instructions (Addendum)
Continue on Zyrtec at bedtime Continue on Singulair daily Add saline nasal spray twice daily as needed for congestion Add saline nasal gel at nighttime Add Nasacort nasal spray 2 puffs daily in am .  Trial of Asmanex  2 puffs  daily  , rinse well after use.  Follow-up with Dr. Lake Bells or Jennings Stirling NP  in 2 months and As needed    Late add : will get chest xray on return .

## 2019-01-25 NOTE — Assessment & Plan Note (Signed)
May have some increased Asthma symptoms . Spirometry is normal , FENO is normal  Trial of Asmanex , if not change can d/c on return  Control for triggers Check chest xray   Plan  Patient Instructions  Continue on Zyrtec at bedtime Continue on Singulair daily Add saline nasal spray twice daily as needed for congestion Add saline nasal gel at nighttime Add Nasacort nasal spray 2 puffs daily in am .  Trial of Asmanex  2 puffs  daily  , rinse well after use.  Follow-up with Dr. Lake Bells or Marisella Puccio NP  in 2 months and As needed

## 2019-01-25 NOTE — Assessment & Plan Note (Signed)
Mild flare   Plan  Patient Instructions  Continue on Zyrtec at bedtime Continue on Singulair daily Add saline nasal spray twice daily as needed for congestion Add saline nasal gel at nighttime Add Nasacort nasal spray 2 puffs daily in am .  Trial of Asmanex  2 puffs  daily  , rinse well after use.  Follow-up with Dr. Lake Bells or Parrett NP  in 2 months and As needed

## 2019-01-25 NOTE — Addendum Note (Signed)
Addended by: Parke Poisson E on: 01/25/2019 04:49 PM   Modules accepted: Orders

## 2019-01-25 NOTE — Progress Notes (Signed)
Reviewed, agree 

## 2019-01-25 NOTE — Progress Notes (Signed)
@Patient  ID: Amanda Dalton, female    DOB: 1931/04/08, 85 y.o.   MRN: [id number]  Chief Complaint  Patient presents with  . Follow-up    Asthma    Referring provider: Maryland Pink, MD  HPI: 85 year old female never smoker followed for asthma Medical history significant for A. fib on Eliquis  TEST  March 2017 pulmonary function testing ratio 68%, FEV1 1.09 L (67% protected) deceased, FVC 1.60 (71% predicted)  March 2016 spirometry testing from [facility]: Ratio 61%, FEV1 1.35 L, FVC 2.2 L (disease 102% predicted)  01/25/2019 Follow up : Asthma  Patient returns for a one-year follow-up.  Patient says overall breathing has been doing okay.  She does have some intermittent nasal congestion drippy nose.  Says that sometimes her nose runs like a faucet.  This is very aggravating to her.  She also notices that she has been having some increased intermittent episodes where she has had wheezing and shortness of breath.  Says that she can hear herself wheeze.  She denies any fever, discolored mucus, chest pain, orthopnea or increased leg swelling.  Says symptoms are mainly when she exerts herself.  Denies syncope or palpitations. She says she is recently also been seen by the cardiologist and they are considering a stress test for her shortness of breath. She is on Zyrtec and Singulair daily.       Spirometry today shows minimal airflow obstruction with FEV1 at 78%, ratio 69, FVC 83% FENO 7ppb   Allergies  Allergen Reactions  . Paxil [Paroxetine Hcl] Nausea Only    Immunization History  Administered Date(s) Administered  . Influenza Split 08/26/2015  . Influenza,inj,Quad PF,6+ Mos 09/19/2018  . Influenza-Unspecified 09/04/2015, 09/23/2016, 09/22/2017  . Pneumococcal Conjugate-13 02/28/2015  . Pneumococcal Polysaccharide-23 02/22/2013, 09/29/2017  . Tdap 11/30/2011, 10/14/2016  . Zoster Recombinat (Shingrix) 01/25/2018    Past Medical History:  Diagnosis Date  . Anxiety   .  Asthma   . Atrial flutter ([place])    a. Dx 11/17/2016-->CHA2DS2VASc = 3-->Eliquis 5 mg BID.  Marland Kitchen Breast cancer ([place])    a. 1996 s/p R modified radical mastectomy-->chemo w/ tamoxifen.  . Endometrial cancer ([employer])    a. 08/2001 s/p hysterectomy. No node involvement.  . Fracture of right humerus 11/2008  . Hypothyroidism   . Osteoporosis     Tobacco History: Social History   Tobacco Use  Smoking Status Never Smoker  Smokeless Tobacco Never Used   Counseling given: Not Answered   Outpatient Medications Prior to Visit  Medication Sig Dispense Refill  . albuterol (PROAIR HFA) 108 (90 Base) MCG/ACT inhaler Inhale 1-2 puffs into the lungs every 6 (six) hours as needed for wheezing or shortness of breath. 1 Inhaler 5  . apixaban (ELIQUIS) 5 MG TABS tablet Take 1 tablet (5 mg total) by mouth 2 (two) times daily. 180 tablet 0  . atenolol (TENORMIN) 25 MG tablet Take 1/2 tablet (12.5 mg) once daily as needed for symptoms related to fast heart rates 180 tablet 3  . Calcium Carb-Cholecalciferol (CALCIUM-VITAMIN D) 600-400 MG-UNIT TABS Take 1 tablet by mouth daily.     . cetirizine (ZYRTEC) 10 MG tablet Take 10 mg by mouth daily.     . citalopram (CELEXA) 40 MG tablet Take 40 mg by mouth every evening.     . Flaxseed, Linseed, (FLAX SEED OIL PO) Take 1 capsule by mouth daily.    Marland Kitchen levothyroxine (SYNTHROID, LEVOTHROID) 75 MCG tablet Take 75 mcg by mouth daily before breakfast.    .  magnesium oxide (MAG-OX) 400 MG tablet Take 400 mg by mouth daily.    . montelukast (SINGULAIR) 10 MG tablet Take 10 mg by mouth every evening.     . Multiple Vitamin (MULTIVITAMIN WITH MINERALS) TABS tablet Take 1 tablet by mouth daily.    . vitamin C (ASCORBIC ACID) 500 MG tablet Take 500 mg by mouth daily.     No facility-administered medications prior to visit.      Review of Systems:   Constitutional:   No  weight loss, night sweats,  Fevers, chills, + fatigue, or  lassitude.  HEENT:   No headaches,   Difficulty swallowing,  Tooth/dental problems, or  Sore throat,                No sneezing, itching, ear ache,  +nasal congestion, post nasal drip,   CV:  No chest pain,  Orthopnea, PND, swelling in lower extremities, anasarca, dizziness, palpitations, syncope.   GI  No heartburn, indigestion, abdominal pain, nausea, vomiting, diarrhea, change in bowel habits, loss of appetite, bloody stools.   Resp: No shortness of breath with exertion or at rest.  No excess mucus, no productive cough,  No non-productive cough,  No coughing up of blood.  No change in color of mucus.  No wheezing.  No chest wall deformity  Skin: no rash or lesions.  GU: no dysuria, change in color of urine, no urgency or frequency.  No flank pain, no hematuria   MS:  No joint pain or swelling.  No decreased range of motion.  No back pain.    Physical Exam  BP 124/70 (BP Location: Left Arm, Cuff Size: Normal)   Pulse 73   Ht 5' (1.524 m)   Wt 149 lb 3.2 oz (67.7 kg)   SpO2 97%   BMI 29.14 kg/m   GEN: A/Ox3; pleasant , NAD, elderly    HEENT:  [place]/AT,  EACs-clear, TMs-wnl, NOSE-clear drainage  THROAT-clear, no lesions, no postnasal drip or exudate noted.   NECK:  Supple w/ fair ROM; no JVD; normal carotid impulses w/o bruits; no thyromegaly or nodules palpated; no lymphadenopathy.    RESP  Clear  P & A; w/o, wheezes/ rales/ or rhonchi. no accessory muscle use, no dullness to percussion  CARD:  RRR, no m/r/g, no peripheral edema, pulses intact, no cyanosis or clubbing.  GI:   Soft & nt; nml bowel sounds; no organomegaly or masses detected.   Musco: Warm bil, no deformities or joint swelling noted.   Neuro: alert, no focal deficits noted.    Skin: Warm, no lesions or rashes    Lab Results:   BMET  BNP No results found for: BNP  ProBNP No results found for: PROBNP  Imaging: No results found.    No flowsheet data found.  No results found for: NITRICOXIDE      Assessment & Plan:    Uncomplicated asthma May have some increased Asthma symptoms . Spirometry is normal , FENO is normal  Trial of Asmanex , if not change can d/c on return  Control for triggers Check chest xray   Plan  Patient Instructions  Continue on Zyrtec at bedtime Continue on Singulair daily Add saline nasal spray twice daily as needed for congestion Add saline nasal gel at nighttime Add Nasacort nasal spray 2 puffs daily in am .  Trial of Asmanex  2 puffs  daily  , rinse well after use.  Follow-up with Dr. Lake Bells or Parrett NP  in 2 months  and As needed         Allergic rhinitis Mild flare   Plan  Patient Instructions  Continue on Zyrtec at bedtime Continue on Singulair daily Add saline nasal spray twice daily as needed for congestion Add saline nasal gel at nighttime Add Nasacort nasal spray 2 puffs daily in am .  Trial of Asmanex  2 puffs  daily  , rinse well after use.  Follow-up with Dr. Lake Bells or Parrett NP  in 2 months and As needed            Rexene Edison, NP 01/25/2019

## 2019-01-25 NOTE — Progress Notes (Signed)
Patient seen in the office today and instructed on use of Asmanex Twisthaler.  Patient expressed understanding and demonstrated technique. Parke Poisson, CMA 01/25/19

## 2019-01-26 ENCOUNTER — Telehealth: Payer: Self-pay | Admitting: Pulmonary Disease

## 2019-01-26 NOTE — Telephone Encounter (Signed)
LMTCB x1 for pt.  

## 2019-01-29 NOTE — Telephone Encounter (Signed)
Spoke with the pt and notified of recs per TP  She verbalized understanding Nothing further needed 

## 2019-01-29 NOTE — Telephone Encounter (Signed)
Called and spoke with Patient.  Patient saw Tammy P, 01/25/19. Patient stated that she has decided not to use the Asmanex inhaler.  She stated that [place] price was $221, and she can not afford that.  Patient stated that she has gum issues already, and is afraid that inhalers may make her gums worse.  Patient stated that she is using Nasacort, nasal gel, Singulair, and Zertec.  She feels that regimen is working best for her at this time.   Will route message to Tammy P, NP, as Juluis Rainier

## 2019-01-29 NOTE — Telephone Encounter (Signed)
That is fine . I understand. Lets go with her current regimen and leave off Asmanex for now  Tell her to call if symptoms worsen or do not improve Please contact office for sooner follow up if symptoms do not improve or worsen or seek emergency care

## 2019-02-12 ENCOUNTER — Telehealth: Payer: Self-pay | Admitting: Internal Medicine

## 2019-02-12 NOTE — Telephone Encounter (Signed)
Called and spoke to pt regarding appointment 02/15/19  No answer  Left VM

## 2019-02-14 NOTE — Telephone Encounter (Signed)
I spoke with the patient regarding her appointment scheduled for 02/15/19 with Dr. Caryl Comes. She states she has been fairly stable from a cardiac perspective. She did have 1 episode of tachycardia over the last few days where her HR was 143 bpm. She states she had gotten upset over something, but her HR settled down fairly quickly.  Her breathing is stable. She is followed by Dr. Lake Bells in pulmonary.     Cardiac Questionnaire:    Since your last visit or hospitalization:    1. Have you been having new or worsening chest pain? No   2. Have you been having new or worsening shortness of breath? No 3. Have you been having new or worsening leg swelling, wt gain, or increase in abdominal girth (pants fitting more tightly)? No   4. Have you had any passing out spells? No     I have advised the patient that due to the fact that she is stable, we will need to push out her appointment to at least 4 weeks from today with the understanding that we will need to re-evaluate the current status of the COVID-19 as it arises.  I have advised her in the interim to please call us with any further questions/ concerns/ any worsening symptoms or increase in duration of symptoms. The patient voices understanding and is agreeable.   She has been rescheduled to 03/15/19 with Dr. Caryl Comes at 9:00 am.

## 2019-02-15 ENCOUNTER — Ambulatory Visit: Payer: Medicare Other | Admitting: Internal Medicine

## 2019-03-06 ENCOUNTER — Telehealth: Payer: Self-pay

## 2019-03-06 NOTE — Telephone Encounter (Signed)
Virtual Visit Pre-Appointment Phone Call  Steps For Call:  1. Confirm consent - "In the setting of the current Covid19 crisis, you are scheduled for a VIDEO visit with your provider on 05/15/2019 at 9:00.  Just as we do with many in-office visits, in order for you to participate in this visit, we must obtain consent.  If you'd like, I can send this email for you to review.  Otherwise, I can obtain your verbal consent now.  All virtual visits are billed to your insurance company just like a normal visit would be.  By agreeing to a virtual visit, we'd like you to understand that the technology does not allow for your provider to perform an examination, and thus may limit your provider's ability to fully assess your condition.  Finally, though the technology is pretty good, we cannot assure that it will always work on either your or our end, and in the setting of a video visit, we may have to convert it to a phone-only visit.  In either situation, we cannot ensure that we have a secure connection.  Are you willing to proceed?" yes  2.     TELEPHONE CALL NOTE  Amanda Dalton has been deemed a candidate for a follow-up tele-health visit to limit community exposure during the Covid-19 pandemic. I spoke with the patient via phone to ensure availability of phone/video source, confirm preferred email & phone number, and discuss instructions and expectations.  I reminded Amanda Dalton to be prepared with any vital sign and/or heart rhythm information that could potentially be obtained via home monitoring, at the time of her visit. I reminded Amanda Dalton to expect a phone call at the time of her visit if her visit.  Did the patient verbally acknowledge consent to treatment?Yes  Amanda Dalton, RMA 03/06/2019 4:34 PM   CONSENT FOR TELE-HEALTH VISIT - PLEASE REVIEW  I hereby voluntarily request, consent and authorize [facility] and its employed or contracted physicians, physician assistants, nurse  practitioners or other licensed health care professionals (the Practitioner), to provide me with telemedicine health care services (the "Services") as deemed necessary by the treating Practitioner. I acknowledge and consent to receive the Services by the Practitioner via telemedicine. I understand that the telemedicine visit will involve communicating with the Practitioner through live audiovisual communication technology and the disclosure of certain medical information by electronic transmission. I acknowledge that I have been given the opportunity to request an in-person assessment or other available alternative prior to the telemedicine visit and am voluntarily participating in the telemedicine visit.  I understand that I have the right to withhold or withdraw my consent to the use of telemedicine in the course of my care at any time, without affecting my right to future care or treatment, and that the Practitioner or I may terminate the telemedicine visit at any time. I understand that I have the right to inspect all information obtained and/or recorded in the course of the telemedicine visit and may receive copies of available information for a reasonable fee.  I understand that some of the potential risks of receiving the Services via telemedicine include:  Marland Kitchen Delay or interruption in medical evaluation due to technological equipment failure or disruption; . Information transmitted may not be sufficient (e.g. poor resolution of images) to allow for appropriate medical decision making by the Practitioner; and/or  . In rare instances, security protocols could fail, causing a breach of personal health information.  Furthermore, I  acknowledge that it is my responsibility to provide information about my medical history, conditions and care that is complete and accurate to the best of my ability. I acknowledge that Practitioner's advice, recommendations, and/or decision may be based on factors not within  their control, such as incomplete or inaccurate data provided by me or distortions of diagnostic images or specimens that may result from electronic transmissions. I understand that the practice of medicine is not an exact science and that Practitioner makes no warranties or guarantees regarding treatment outcomes. I acknowledge that I will receive a copy of this consent concurrently upon execution via email to the email address I last provided but may also request a printed copy by calling the office of [facility].    I understand that my insurance will be billed for this visit.   I have read or had this consent read to me. . I understand the contents of this consent, which adequately explains the benefits and risks of the Services being provided via telemedicine.  . I have been provided ample opportunity to ask questions regarding this consent and the Services and have had my questions answered to my satisfaction. . I give my informed consent for the services to be provided through the use of telemedicine in my medical care  By participating in this telemedicine visit I agree to the above.

## 2019-03-14 NOTE — Progress Notes (Signed)
Electrophysiology TeleHealth Note   Due to national recommendations of social distancing due to COVID 19, an audio/video telehealth visit is felt to be most appropriate for this patient at this time.  See MyChart message from today for the patient's consent to telehealth for [facility].   Date:  03/15/2019   ID:  Amanda Dalton, DOB Sep 13, 1931, MRN [id number]  Location: patient's home  Provider location: [address], [place]  Evaluation Performed: Follow-up visit  PCP:  Maryland Pink, MD  Cardiologist:    Electrophysiologist:  SK   Chief Complaint:  [name]  History of Present Illness:    Amanda Dalton is a 85 y.o. female who presents via audio/video conferencing for a telehealth visit today.  Since last being seen in our clinicWith complaints of dyspnea presumed to be multifactorial related to lung disease as well as possibly atrial fibrillation with rapid rate she has been doing variably well.  She notes that some days her dyspnea and exercise tolerance are very good and some days it is not nearly as good.  No clear association with palpitations.  Because of a history of bradycardia, we have been trying to use as needed beta-blockers.  anticoagulation with apixoban ( 5 mg- age> 80, wt >60, Cr < 1.5)    Date Cr Hgb  5/19 0.83 12.4  12/19  0.8   3/20 0.8 13.9   Event recorder was personally reviewed.  Paroxysmal atrial fibrillation with episodes lasting hours, mean heart rates in sinus are in the 50s, mean heart rates in atrial fibrillation are in the 70s.  Thromboembolic risk factors ( age-84, Gender-1 ) for a CHADSVASc Score of3   . DATE TEST EF   1/18 Echo 50-55% LAE mild PA 50-2mm          The patient denies symptoms of fevers, chills, cough, or new SOB worrisome for COVID 19.  *  Past Medical History:  Diagnosis Date  . Anxiety   . Asthma   . Atrial flutter ([place])    a. Dx 11/17/2016-->CHA2DS2VASc = 3-->Eliquis 5  mg BID.  Marland Kitchen Breast cancer ([place])    a. 1996 s/p R modified radical mastectomy-->chemo w/ tamoxifen.  . Endometrial cancer ([place])    a. 08/2001 s/p hysterectomy. No node involvement.  . Fracture of right humerus 11/2008  . Hypothyroidism   . Osteoporosis     Past Surgical History:  Procedure Laterality Date  . CARDIOVERSION N/A 04/19/2018   Procedure: CARDIOVERSION;  Surgeon: Minna Merritts, MD;  Location: [facility] ORS;  Service: Cardiovascular;  Laterality: N/A;  . Right Mastectomy  1996  . VAGINAL HYSTERECTOMY  08/2001    Current Outpatient Medications  Medication Sig Dispense Refill  . albuterol (PROAIR HFA) 108 (90 Base) MCG/ACT inhaler Inhale 1-2 puffs into the lungs every 6 (six) hours as needed for wheezing or shortness of breath. 1 Inhaler 5  . apixaban (ELIQUIS) 5 MG TABS tablet Take 1 tablet (5 mg total) by mouth 2 (two) times daily. 180 tablet 0  . atenolol (TENORMIN) 25 MG tablet Take 1/2 tablet (12.5 mg) once daily as needed for symptoms related to fast heart rates 180 tablet 3  . cetirizine (ZYRTEC) 10 MG tablet Take 10 mg by mouth daily.     . citalopram (CELEXA) 40 MG tablet Take 40 mg by mouth every evening.     Marland Kitchen levothyroxine (SYNTHROID, LEVOTHROID) 75 MCG tablet Take 75 mcg by mouth daily before breakfast.    .  montelukast (SINGULAIR) 10 MG tablet Take 10 mg by mouth every evening.     . Calcium Carb-Cholecalciferol (CALCIUM-VITAMIN D) 600-400 MG-UNIT TABS Take 1 tablet by mouth daily.     . Flaxseed, Linseed, (FLAX SEED OIL PO) Take 1 capsule by mouth daily.    . magnesium oxide (MAG-OX) 400 MG tablet Take 400 mg by mouth daily.    . mometasone (ASMANEX, 60 METERED DOSES,) 220 MCG/INH inhaler Inhale 2 puffs into the lungs daily. (Patient not taking: Reported on 03/15/2019) 1 Inhaler 0  . Mometasone Furoate (ASMANEX, 30 METERED DOSES,) 110 MCG/INH AEPB Inhale 2 puffs into the lungs daily. (Patient not taking: Reported on 03/15/2019) 1 Inhaler 5  . Multiple Vitamin  (MULTIVITAMIN WITH MINERALS) TABS tablet Take 1 tablet by mouth daily.    Marland Kitchen ofloxacin (OCUFLOX) 0.3 % ophthalmic solution INSTILL 1 DROP INTO RIGHT EYE 4 TIMES DAILY FOR 5 DAYS.    Marland Kitchen vitamin C (ASCORBIC ACID) 500 MG tablet Take 500 mg by mouth daily.     No current facility-administered medications for this visit.     Allergies:   Paxil [paroxetine hcl]   Social History:  The patient  reports that she has never smoked. She has never used smokeless tobacco. She reports current alcohol use of about 2.0 standard drinks of alcohol per week. She reports that she does not use drugs.   Family History:  The patient's   family history includes Breast cancer in her sister; Hypertension in her mother; Osteoporosis in her sister; Stroke in her father and mother.   ROS:  Please see the history of present illness.   All other systems are personally reviewed and negative.    Exam:    Vital Signs:  BP 123/72 (BP Location: Left Arm, Patient Position: Sitting, Cuff Size: Normal)   Pulse 65   Ht 5' (1.524 m)   Wt 147 lb (66.7 kg)   BMI 28.71 kg/m     Well appearing, alert and conversant, regular work of breathing,  good skin color  Eyes- anicteric, neuro- grossly intact healing styes on both eyes, skin- no apparent rash or lesions or cyanosis, mouth- oral mucosa is pink   Labs/Other Tests and Data Reviewed:    Recent Labs: 04/11/2018: BUN 15; Creatinine, Ser 0.83; Hemoglobin 12.4; Platelets 275; Potassium 4.6; Sodium 138   Wt Readings from Last 3 Encounters:  03/15/19 147 lb (66.7 kg)  01/25/19 149 lb 3.2 oz (67.7 kg)  12/14/18 148 lb 4 oz (67.2 kg)     Other studies personally reviewed: Additional studies/ records that were reviewed today include event recorder as above as well as labs      ASSESSMENT & PLAN:    Atrial fibrillation-paroxysmal  Bradycardia-sinus  Dyspnea on exertion   ZIO Patch monitoring demonstrated sinus rates with a mean of about 50; her atrial fibrillation  rates are in the mid 70s.  Given her episodic dyspnea, I wonder whether we will be able to implicate bradycardia or atrial fibrillation as a contributing factor.  That will give [place] a therapeutic target.  I have asked her to write her heart rates down when she returns from her walks.  No bleeding on the Eliquis.  No evidence of heart failure/edema.   COVID 19 screen The patient denies symptoms of COVID 19 at this time.  The importance of social distancing was discussed today.  Follow-up: 2 weeks phone call to get a list of heart rates Telehealth in 3 weeks    Current  medicines are reviewed at length with the patient today.   The patient does not have concerns regarding her medicines.  The following changes were made today:  none  Labs/ tests ordered today include:   No orders of the defined types were placed in this encounter.     Patient Risk:  after full review of this patients clinical status, I feel that they are at moderate risk at this time.  Today, I have spent 16  minutes with the patient with telehealth technology discussing the above.  Signed, Virl Axe, MD  03/15/2019 9:25 AM     [facility] [address] [place] [address] [phone] (office) [phone] (fax)

## 2019-03-15 ENCOUNTER — Other Ambulatory Visit: Payer: Self-pay

## 2019-03-15 ENCOUNTER — Telehealth (INDEPENDENT_AMBULATORY_CARE_PROVIDER_SITE_OTHER): Payer: Medicare Other | Admitting: Internal Medicine

## 2019-03-15 VITALS — BP 123/72 | HR 65 | Ht 60.0 in | Wt 147.0 lb

## 2019-03-15 DIAGNOSIS — I4819 Other persistent atrial fibrillation: Secondary | ICD-10-CM

## 2019-03-15 DIAGNOSIS — I4891 Unspecified atrial fibrillation: Secondary | ICD-10-CM

## 2019-03-15 DIAGNOSIS — R001 Bradycardia, unspecified: Secondary | ICD-10-CM

## 2019-03-15 NOTE — Patient Instructions (Signed)
Medication Instructions:  - Your physician recommends that you continue on your current medications as directed. Please refer to the Current Medication list given to you today.  If you need a refill on your cardiac medications before your next appointment, please call your pharmacy.   Lab work: - none ordered  If you have labs (blood work) drawn today and your tests are completely normal, you will receive your results only by: Marland Kitchen MyChart Message (if you have MyChart) OR . A paper copy in the mail If you have any lab test that is abnormal or we need to change your treatment, we will call you to review the results.  Testing/Procedures: - none ordered  Follow-Up: At [facility], you and your health needs are our priority.  As part of our continuing mission to provide you with exceptional heart care, we have created designated Provider Care Teams.  These Care Teams include your primary Cardiologist (physician) and Advanced Practice Providers (APPs -  Physician Assistants and Nurse Practitioners) who all work together to provide you with the care you need, when you need it. . 3 weeks- Video E-Viist with Dr. Caryl Comes  Any Other Special Instructions Will Be Listed Below (If Applicable).  - Please write down your heart rates when you return from walks. Nira Conn, RN will call you in 2 weeks to follow up on the readings you are getting   Take Care!

## 2019-03-26 ENCOUNTER — Ambulatory Visit: Payer: Medicare Other | Admitting: Adult Health

## 2019-03-27 ENCOUNTER — Encounter: Payer: Self-pay | Admitting: Adult Health

## 2019-03-27 ENCOUNTER — Other Ambulatory Visit: Payer: Self-pay

## 2019-03-27 ENCOUNTER — Ambulatory Visit (INDEPENDENT_AMBULATORY_CARE_PROVIDER_SITE_OTHER): Payer: Medicare Other | Admitting: Adult Health

## 2019-03-27 ENCOUNTER — Ambulatory Visit: Payer: Medicare Other | Admitting: Pulmonary Disease

## 2019-03-27 DIAGNOSIS — J309 Allergic rhinitis, unspecified: Secondary | ICD-10-CM

## 2019-03-27 DIAGNOSIS — J4520 Mild intermittent asthma, uncomplicated: Secondary | ICD-10-CM

## 2019-03-27 MED ORDER — ALBUTEROL SULFATE HFA 108 (90 BASE) MCG/ACT IN AERS: 1.0000 | INHALATION_SPRAY | Freq: Four times a day (QID) | RESPIRATORY_TRACT | 5 refills | Status: DC | PRN

## 2019-03-27 NOTE — Addendum Note (Signed)
Addended by: Parke Poisson E on: 03/27/2019 11:45 AM   Modules accepted: Orders

## 2019-03-27 NOTE — Progress Notes (Signed)
Virtual Visit via Telephone Note  I connected with Amanda Dalton on 03/27/19 at 10:30 AM EDT by telephone and verified that I am speaking with the correct person using two identifiers.   I discussed the limitations, risks, security and privacy concerns of performing an evaluation and management service by telephone and the availability of in person appointments. I also discussed with the patient that there may be a patient responsible charge related to this service. The patient expressed understanding and agreed to proceed.   History of Present Illness: Today's tele-visit is for a 47-month follow-up for asthma Patient is present for today's visit at home, myself is present for today's visit at pulmonary office  85 year old female never smoker followed for asthma Medical history significant for A. fib on Eliquis  Patient returns for a 76-month follow-up for asthma. Patient says overall breathing is doing okay . Last visit she was started on Nasonex in addition to her zyrtec and singulair for flare of allergic rhinitis . Feels nasal drainage is better. She was also given a trial of Asmanex but insurance would not cover. Says feels breathing has been better. Did not feel she needed another inhaler . She feels since she has been staying inside her breathing is better with less exposure to the pollen .   Trying to stay active . Does light housework. She is independent , able to drive.  Occasional use of ProAir. <1-2 x week.   Is following with cardiology, has A Fib on Eliquis . She is monitoring her pulse rates and keeping log.   Had styes on both eyes recently, now clearing up after antibiotic eye cream.   Observations/Objective:  TEST March 2017 pulmonary function testing ratio 68%, FEV1 1.09 L (67% protected) deceased, FVC 1.60 (71% predicted)  March 2016 spirometry testing from [facility]: Ratio 61%, FEV1 1.35 L, FVC 2.2 L (disease 102% predicted)  Spirometry 12/2018  shows minimal airflow  obstruction with FEV1 at 78%, ratio 69, FVC 83% FENO 7ppb    Assessment and Plan: Asthma -stable   Allergic Rhinitis-improved   Plan  Patient Instructions  Continue on Zyrtec at bedtime Continue on Singulair daily Saline nasal spray twice daily as needed for congestion Saline nasal gel at nighttime Nasacort nasal spray 2 puffs daily in am .  Proair 2 puffs every 4hrs as needed for wheezing  Follow-up with Dr. [place] Bells or Parrett NP  in 4 months and As needed            Follow Up Instructions: [place] thank you follow-up in 1 month and as needed  I discussed the assessment and treatment plan with the patient. The patient was provided an opportunity to ask questions and all were answered. The patient agreed with the plan and demonstrated an understanding of the instructions.   The patient was advised to call back or seek an in-person evaluation if the symptoms worsen or if the condition fails to improve as anticipated.  I provided 22  minutes of non-face-to-face time during this encounter.   Rexene Edison, NP

## 2019-03-27 NOTE — Addendum Note (Signed)
Addended by: Parke Poisson E on: 03/27/2019 11:24 AM   Modules accepted: Orders

## 2019-03-27 NOTE — Patient Instructions (Addendum)
Continue on Zyrtec at bedtime Continue on Singulair daily Saline nasal spray twice daily as needed for congestion Saline nasal gel at nighttime Nasacort nasal spray 2 puffs daily in am .  Proair 2 puffs every 4hrs as needed for wheezing  Follow-up with Dr. [place] Bells or Soleia Badolato NP  in 4 months and As needed

## 2019-03-29 NOTE — Progress Notes (Signed)
Reviewed, agree 

## 2019-03-30 ENCOUNTER — Telehealth: Payer: Self-pay | Admitting: *Deleted

## 2019-03-30 NOTE — Telephone Encounter (Signed)
-----   Message from Emily Filbert, RN sent at 03/15/2019 11:43 AM EDT ----- Call in 2 weeks (~4/30) to follow up HR readings when she returns from her walks.

## 2019-03-30 NOTE — Telephone Encounter (Signed)
Attempted to contact the patient regarding her HR readings after she returns from her walks. No answer- I left a message for the patient to please call back.

## 2019-04-02 NOTE — Telephone Encounter (Signed)
Patient returning call.

## 2019-04-02 NOTE — Telephone Encounter (Signed)
I spoke with the patient.  She states she didn't get out much to walk the 1st week after her visit with Dr. Caryl Comes due to being on an antibiotic- she wasn't supposed to be in the sun.  She does reports readings as below:  1) Sunday- 4/26 - took a long nap> woke up at 3 pm & HR was 137 bpm> waited a little> HR down to 100 bpm> took 1/2 of an atenolol - after walking HR was "high", but came down to 84 bpm  2) Monday 4/27-  - before walking> HR's 72-82 bpm - after walking> 66-73 bpm (felt very winded with walking)   3) Saturday 5/2- - before walking> 72-78 bpm - after walking> she was 130 bpm then down to 80 bpm  4) Sunday 5/3-  - before walking> 58-70's bpm (O2 sat0 97%) - after walking> 88 bpm (O2 sat- 95%)   The patient states she is walking ~ 1/2 mile. Some days she feels like she really notices the hills more than others.  She is scheduled a telehealth visit with Dr. Caryl Comes on 04/05/19.  To Dr. Caryl Comes to review prior to her visit.

## 2019-04-02 NOTE — Telephone Encounter (Signed)
I attempted to call the patient back.  I advised I will try her a little later today.

## 2019-04-05 ENCOUNTER — Telehealth (INDEPENDENT_AMBULATORY_CARE_PROVIDER_SITE_OTHER): Payer: Medicare Other | Admitting: Internal Medicine

## 2019-04-05 ENCOUNTER — Other Ambulatory Visit: Payer: Self-pay

## 2019-04-05 VITALS — BP 130/73 | HR 53 | Ht 60.0 in | Wt 147.0 lb

## 2019-04-05 DIAGNOSIS — I480 Paroxysmal atrial fibrillation: Secondary | ICD-10-CM

## 2019-04-05 DIAGNOSIS — R001 Bradycardia, unspecified: Secondary | ICD-10-CM

## 2019-04-05 NOTE — Telephone Encounter (Signed)
MD aware- patient had an e-visit this morning.

## 2019-04-05 NOTE — Patient Instructions (Addendum)
Medication Instructions:  - Your physician recommends that you continue on your current medications as directed. Please refer to the Current Medication list given to you today.  If you need a refill on your cardiac medications before your next appointment, please call your pharmacy.   Lab work: - none ordered  If you have labs (blood work) drawn today and your tests are completely normal, you will receive your results only by: Marland Kitchen MyChart Message (if you have MyChart) OR . A paper copy in the mail If you have any lab test that is abnormal or we need to change your treatment, we will call you to review the results.  Testing/Procedures: - none ordered  Follow-Up: At [facility], you and your health needs are our priority.  As part of our continuing mission to provide you with exceptional heart care, we have created designated Provider Care Teams.  These Care Teams include your primary Cardiologist (physician) and Advanced Practice Providers (APPs -  Physician Assistants and Nurse Practitioners) who all work together to provide you with the care you need, when you need it. . in 2-3 weeks with Dr. Caryl Comes. (scheduling will call you to arrange)  Any Other Special Instructions Will Be Listed Below (If Applicable).  - Please continue to monitor your heart rates- include the following information: 1) heart rate readings before walks 2) heart rate readings after walks 3) was it a good walk or a bad walk

## 2019-04-05 NOTE — Progress Notes (Signed)
Electrophysiology TeleHealth Note   Due to national recommendations of social distancing due to COVID 19, an audio/video telehealth visit is felt to be most appropriate for this patient at this time.  See MyChart message from today for the patient's consent to telehealth for [facility].   Date:  04/05/2019   ID:  Amanda Dalton, DOB 18-May-1931, MRN [id number]  Location: patient's home  Provider location: [address], [place]  Evaluation Performed: Follow-up visit  PCP:  Maryland Pink, MD  Cardiologist:   TG Electrophysiologist:  SK   Chief Complaint:  Atrial fibrillation  History of Present Illness:    Amanda Dalton is a 85 y.o. female who presents via audio/video conferencing for a telehealth visit today.  Since last being seen in our clinic by telehealth visit  for atrial fibrillation   the patient reports ongoing   We had sought to determine whether her symptoms-dyspnea were more likely attributable to afib or sinus bradycardia   She continues to have episodes of good walks and that walks unfortunately, she lost her notes off of her phone.  We talked about treatment options including rhythm control if they were the atrial fibrillation associated with a dyspnea or pacing if it were the bradycardia.  She tells me she is quite frightened of the pacemaker as it would require discontinuation of her anticoagulation.  When this happened to her sister her sister had a stroke from which she died.  Her mother also had a stroke.  No chest pain, no LH no edema   The patient denies symptoms of fevers, chills, cough, or new SOB worrisome for COVID 19.    Past Medical History:  Diagnosis Date  . Anxiety   . Asthma   . Atrial flutter ([place])    a. Dx 11/17/2016-->CHA2DS2VASc = 3-->Eliquis 5 mg BID.  Marland Kitchen Breast cancer ([place])    a. 1996 s/p R modified radical mastectomy-->chemo w/ tamoxifen.  . Endometrial cancer ([place])    a. 08/2001 s/p hysterectomy. No node involvement.   . Fracture of right humerus 11/2008  . Hypothyroidism   . Osteoporosis     Past Surgical History:  Procedure Laterality Date  . CARDIOVERSION N/A 04/19/2018   Procedure: CARDIOVERSION;  Surgeon: Minna Merritts, MD;  Location: [facility] ORS;  Service: Cardiovascular;  Laterality: N/A;  . Right Mastectomy  1996  . VAGINAL HYSTERECTOMY  08/2001    Current Outpatient Medications  Medication Sig Dispense Refill  . albuterol (PROAIR HFA) 108 (90 Base) MCG/ACT inhaler Inhale 1-2 puffs into the lungs every 6 (six) hours as needed for wheezing or shortness of breath. 1 Inhaler 5  . apixaban (ELIQUIS) 5 MG TABS tablet Take 1 tablet (5 mg total) by mouth 2 (two) times daily. 180 tablet 0  . atenolol (TENORMIN) 25 MG tablet Take 1/2 tablet (12.5 mg) once daily as needed for symptoms related to fast heart rates 180 tablet 3  . Calcium Carb-Cholecalciferol (CALCIUM-VITAMIN D) 600-400 MG-UNIT TABS Take 1 tablet by mouth daily.     . cetirizine (ZYRTEC) 10 MG tablet Take 10 mg by mouth daily.     . citalopram (CELEXA) 40 MG tablet Take 40 mg by mouth every evening.     . Flaxseed, Linseed, (FLAX SEED OIL PO) Take 1 capsule by mouth daily.    Marland Kitchen levothyroxine (SYNTHROID, LEVOTHROID) 75 MCG tablet Take 75 mcg by mouth daily before breakfast.    . magnesium oxide (MAG-OX) 400 MG tablet Take 400  mg by mouth daily.    . montelukast (SINGULAIR) 10 MG tablet Take 10 mg by mouth every evening.     . Multiple Vitamin (MULTIVITAMIN WITH MINERALS) TABS tablet Take 1 tablet by mouth daily.    . vitamin C (ASCORBIC ACID) 500 MG tablet Take 500 mg by mouth daily.     No current facility-administered medications for this visit.     Allergies:   Paxil [paroxetine hcl]   Social History:  The patient  reports that she has never smoked. She has never used smokeless tobacco. She reports current alcohol use of about 2.0 standard drinks of alcohol per week. She reports that she does not use drugs.   Family History:  The  patient's   family history includes Breast cancer in her sister; Hypertension in her mother; Osteoporosis in her sister; Stroke in her father and mother.   ROS:  Please see the history of present illness.   All other systems are personally reviewed and negative.    Exam:    Vital Signs:  BP 130/73 (BP Location: Left Arm, Patient Position: Sitting, Cuff Size: Normal)   Pulse (!) 53   Ht 5' (1.524 m)   Wt 147 lb (66.7 kg)   BMI 28.71 kg/m     Well appearing, alert and conversant, regular work of breathing,  good skin color Eyes- anicteric, neuro- grossly intact, skin- no apparent rash or lesions or cyanosis, mouth- oral mucosa is pink   Labs/Other Tests and Data Reviewed:    Recent Labs: 04/11/2018: BUN 15; Creatinine, Ser 0.83; Hemoglobin 12.4; Platelets 275; Potassium 4.6; Sodium 138   Wt Readings from Last 3 Encounters:  04/05/19 147 lb (66.7 kg)  03/15/19 147 lb (66.7 kg)  01/25/19 149 lb 3.2 oz (67.7 kg)     Other studies personally reviewed: Additional studies/ records that were reviewed today include:     ASSESSMENT & PLAN:    Atrial fibrillation-paroxysmal  Bradycardia-sinus  Dyspnea on exertion   Asthma   She continues with episodic dyspnea.  It remains unclear to me whether it is related to her rhythm i.e. A. fib yes or no or bradycardia or perhaps for COPD/asthma.  We have come up with a form which she will fill in for the next 2 weeks including heart rates before and after her walk and whether the walk was a good walk or not.  Heart rates in the 50s we presume her sinus, heart rates in the 70s or greater we presume her atrial fibrillation.  We will see...  COVID 19 screen The patient denies symptoms of COVID 19 at this time.  The importance of social distancing was discussed today.  Follow-up:  2-3 weeks     Current medicines are reviewed at length with the patient today.   The patient does not have concerns regarding her medicines.  The following  changes were made today:     Labs/ tests ordered today include:   No orders of the defined types were placed in this encounter.   Future tests ( post COVID )     Patient Risk:  after full review of this patients clinical status, I feel that they are at moderate  risk at this time.  Today, I have spent 21minutes with the patient with telehealth technology discussing the above.  Signed, Virl Axe, MD  04/05/2019 11:30 AM     [facility] [address] [place] [address] [phone] (office) [phone] (fax)

## 2019-04-19 ENCOUNTER — Telehealth (INDEPENDENT_AMBULATORY_CARE_PROVIDER_SITE_OTHER): Payer: Medicare Other | Admitting: Internal Medicine

## 2019-04-19 ENCOUNTER — Other Ambulatory Visit: Payer: Self-pay

## 2019-04-19 VITALS — BP 147/79 | HR 68 | Ht 60.0 in | Wt 147.0 lb

## 2019-04-19 DIAGNOSIS — I480 Paroxysmal atrial fibrillation: Secondary | ICD-10-CM

## 2019-04-19 DIAGNOSIS — R0609 Other forms of dyspnea: Secondary | ICD-10-CM

## 2019-04-19 DIAGNOSIS — R001 Bradycardia, unspecified: Secondary | ICD-10-CM

## 2019-04-19 DIAGNOSIS — J45909 Unspecified asthma, uncomplicated: Secondary | ICD-10-CM

## 2019-04-19 NOTE — Progress Notes (Signed)
If using albuterol more than 2x a week then I typically add something like asmanex

## 2019-04-19 NOTE — Progress Notes (Signed)
Electrophysiology TeleHealth Note   Due to national recommendations of social distancing due to COVID 19, an audio/video telehealth visit is felt to be most appropriate for this patient at this time.  See MyChart message from today for the patient's consent to telehealth for [facility].   Date:  04/19/2019   ID:  Amanda Dalton, DOB [birth year], MRN [id number]  Location: patient's home  Provider location: [address], [place]  Evaluation Performed: Follow-up visit  PCP:  Maryland Pink, MD  Cardiologist:   TG Electrophysiologist:  SK   Chief Complaint:  Atrial fibrillation  History of Present Illness:    Amanda Dalton is a 85 y.o. female who presents via audio/video conferencing for a telehealth visit today.  Since last being seen in our clinic by telehealth visit  for atrial fibrillation   the patient reports ongoing   Acute on chronic shortness of breath.  It is not been clear as to whether the episodes when she walks good days versus bad days are related to atrial fibrillation.  We have tried a couple times to get out that without success.  More recently she tells me that she felt much better walking when she takes her albuterol inhaler   Reviewing the notes from pulmonary, she has been on steroid inhalers periodically, most recently tried on Asmanex, but it was not covered and so it was discontinued and she was left on as needed albuterol.  The patient denies symptoms of fevers, chills, cough, or new SOB worrisome for COVID 19.    Past Medical History:  Diagnosis Date  . Anxiety   . Asthma   . Atrial flutter ([place])    a. Dx 11/17/2016-->CHA2DS2VASc = 3-->Eliquis 5 mg BID.  Marland Kitchen Breast cancer ([place])    a. 1996 s/p R modified radical mastectomy-->chemo w/ tamoxifen.  . Endometrial cancer ([place])    a. 08/2001 s/p hysterectomy. No node involvement.  . Fracture of right humerus 11/2008  . Hypothyroidism   . Osteoporosis     Past Surgical History:  Procedure  Laterality Date  . CARDIOVERSION N/A 04/19/2018   Procedure: CARDIOVERSION;  Surgeon: Minna Merritts, MD;  Location: [facility] ORS;  Service: Cardiovascular;  Laterality: N/A;  . Right Mastectomy  1996  . VAGINAL HYSTERECTOMY  08/2001    Current Outpatient Medications  Medication Sig Dispense Refill  . albuterol (PROAIR HFA) 108 (90 Base) MCG/ACT inhaler Inhale 1-2 puffs into the lungs every 6 (six) hours as needed for wheezing or shortness of breath. 1 Inhaler 5  . apixaban (ELIQUIS) 5 MG TABS tablet Take 1 tablet (5 mg total) by mouth 2 (two) times daily. 180 tablet 0  . atenolol (TENORMIN) 25 MG tablet Take 1/2 tablet (12.5 mg) once daily as needed for symptoms related to fast heart rates 180 tablet 3  . Calcium Carb-Cholecalciferol (CALCIUM-VITAMIN D) 600-400 MG-UNIT TABS Take 1 tablet by mouth daily.     . cetirizine (ZYRTEC) 10 MG tablet Take 10 mg by mouth daily.     . citalopram (CELEXA) 40 MG tablet Take 40 mg by mouth every evening.     . Flaxseed, Linseed, (FLAX SEED OIL PO) Take 1 capsule by mouth daily.    Marland Kitchen levothyroxine (SYNTHROID, LEVOTHROID) 75 MCG tablet Take 75 mcg by mouth daily before breakfast.    . magnesium oxide (MAG-OX) 400 MG tablet Take 400 mg by mouth daily.    . montelukast (SINGULAIR) 10 MG tablet Take 10 mg by mouth  every evening.     . Multiple Vitamin (MULTIVITAMIN WITH MINERALS) TABS tablet Take 1 tablet by mouth daily.    . vitamin C (ASCORBIC ACID) 500 MG tablet Take 500 mg by mouth daily.     No current facility-administered medications for this visit.     Allergies:   Paxil [paroxetine hcl]   Social History:  The patient  reports that she has never smoked. She has never used smokeless tobacco. She reports current alcohol use of about 2.0 standard drinks of alcohol per week. She reports that she does not use drugs.   Family History:  The patient's   family history includes Breast cancer in her sister; Hypertension in her mother; Osteoporosis in her  sister; Stroke in her father and mother.   ROS:  Please see the history of present illness.   All other systems are personally reviewed and negative.    Exam:    Vital Signs:  BP (!) 147/79   Pulse 68   Ht 5' (1.524 m)   Wt 147 lb (66.7 kg)   BMI 28.71 kg/m     Well appearing, alert and conversant, regular work of breathing,  good skin color Eyes- anicteric, neuro- grossly intact, skin- no apparent rash or lesions or cyanosis, mouth- oral mucosa is pink   Labs/Other Tests and Data Reviewed:    Recent Labs: No results found for requested labs within last 8760 hours.   Wt Readings from Last 3 Encounters:  04/19/19 147 lb (66.7 kg)  04/05/19 147 lb (66.7 kg)  03/15/19 147 lb (66.7 kg)     Other studies personally reviewed: Additional studies/ records that were reviewed today include:     ASSESSMENT & PLAN:    Atrial fibrillation-paroxysmal  Bradycardia-sinus  Dyspnea on exertion   Asthma   Not clear that we can associate her variable symptoms with atrial fibrillation.  In her mind, since her last visit, she is more depressed that her symptoms are mitigated most by the use of inhalers suggesting an asthmatic component as opposed to a primary rhythm issue, notably the day she went out for a walk her heart rate was in the 70s which we think represents atrial fibrillation.  Taking anticoagulation without bleeding.  I will reach out to pulmonary to ask whether she should be on chronic steroid inhalers.  We will see her about 3 months   COVID 19 screen  Denies COVID symptoms; importance of distancing and quarantining reiterated  No questions regarding her medications.  No changes made.    No testing Patient Risk:  after full review of this patients clinical status, I feel that they are at moderate  risk at this time.  Today, I have spent 15 minutes with the patient with telehealth technology discussing the above.  Signed, Virl Axe, MD  04/19/2019 12:14  PM     [place] [address] [phone] (office) [phone] (fax)

## 2019-04-19 NOTE — Patient Instructions (Addendum)
Medication Instructions:  - Your physician recommends that you continue on your current medications as directed. Please refer to the Current Medication list given to you today.  If you need a refill on your cardiac medications before your next appointment, please call your pharmacy.   Lab work: - none ordered  If you have labs (blood work) drawn today and your tests are completely normal, you will receive your results only by: Marland Kitchen MyChart Message (if you have MyChart) OR . A paper copy in the mail If you have any lab test that is abnormal or we need to change your treatment, we will call you to review the results.  Testing/Procedures: - none ordered  Follow-Up: At [facility], you and your health needs are our priority.  As part of our continuing mission to provide you with exceptional heart care, we have created designated Provider Care Teams.  These Care Teams include your primary Cardiologist (physician) and Advanced Practice Providers (APPs -  Physician Assistants and Nurse Practitioners) who all work together to provide you with the care you need, when you need it.  . You will need a follow up appointment in 3 months with Dr. Caryl Comes (August).  . Please call our office 2 months in advance to schedule this appointment. (call in mid to late June to schedule)   Any Other Special Instructions Will Be Listed Below (If Applicable). - N/A

## 2019-04-20 NOTE — Progress Notes (Signed)
B  thx   H  Would you call her and get a sense of the frequencywith which she is using her inhaler  Thanks SK

## 2019-04-23 NOTE — Progress Notes (Signed)
Triage, please see notes below from Dr. Caryl Comes I think we should offer a virtual visit

## 2019-04-24 ENCOUNTER — Ambulatory Visit (INDEPENDENT_AMBULATORY_CARE_PROVIDER_SITE_OTHER): Payer: Medicare Other | Admitting: Adult Health

## 2019-04-24 ENCOUNTER — Telehealth: Payer: Self-pay

## 2019-04-24 ENCOUNTER — Encounter: Payer: Self-pay | Admitting: Adult Health

## 2019-04-24 ENCOUNTER — Other Ambulatory Visit: Payer: Self-pay

## 2019-04-24 DIAGNOSIS — J4520 Mild intermittent asthma, uncomplicated: Secondary | ICD-10-CM

## 2019-04-24 DIAGNOSIS — J310 Chronic rhinitis: Secondary | ICD-10-CM | POA: Diagnosis not present

## 2019-04-24 MED ORDER — BECLOMETHASONE DIPROP HFA 40 MCG/ACT IN AERB: 2.0000 | INHALATION_SPRAY | Freq: Two times a day (BID) | RESPIRATORY_TRACT | 5 refills | Status: DC

## 2019-04-24 NOTE — Telephone Encounter (Signed)
Received a message from BQ stating to get patient scheduled for a televisit to discuss her albuterol use. Per patient, she is using her albuterol inhaler every time she has to walk long distances to prevent further chest discomfort and SOB.   She has been scheduled for a televisit this afternoon at 4pm with TP.   Nothing further needed at time of call.

## 2019-04-24 NOTE — Progress Notes (Signed)
Patient scheduled for a telehealth visit with Rexene Edison, NP (pulmonary) today.

## 2019-04-24 NOTE — Progress Notes (Signed)
Virtual Visit via Telephone Note  I connected with Amanda Dalton on 04/24/19 at  4:00 PM EDT by telephone and verified that I am speaking with the correct person using two identifiers.  Location: Patient: Home  Provider: Office    I discussed the limitations, risks, security and privacy concerns of performing an evaluation and management service by telephone and the availability of in person appointments. I also discussed with the patient that there may be a patient responsible charge related to this service. The patient expressed understanding and agreed to proceed.   History of Present Illness: Today tele-visit is a follow-up for asthma  Patient is an 85 year old female never smoker followed for asthma. Medical history significant for A. fib on Eliquis Patient says overall breathing has been doing well.  Patient says she does have some intermittent wheezing that happens when she goes for a walk and sometimes at nighttime when she lies down.  She was felt to have some possible postnasal drainage, rhinitis that was contributing to her symptoms.  He was continued on Zyrtec and Singulair.  And Nasonex was added to her regimen.  Previously she was also added Asmanex to her regimen however patient insurance would not cover this and she felt it was too expensive to try this.  Patient says of recent that she uses her pro-air on occasion before she goes for walks or at night.  And this seems to resolve all of her wheezing.  On average she is use her pro-air about 2-3 times a week.  She denies any increased cough, fever, discolored mucus or chest pain.  Patient says that she recently had a visit with her cardiologist for her A. fib.  Who recommended an additional inhaler to help with her symptoms.   Observations/Objective: March 2017 pulmonary function testing ratio 68%, FEV1 1.09 L (67% protected) deceased, FVC 1.60 (71% predicted)  March 2016 spirometry testing from [facility]: Ratio 61%, FEV1 1.35 L,  FVC 2.2 L (disease 102% predicted)  Spirometry 12/2018  shows minimal airflow obstruction with FEV1 at 78%, ratio 69, FVC 83% FENO 7ppb   Assessment and Plan: Intermittent asthma.  Patient does have intermittent symptoms which seem to be controlled with pro-air however with her underlying A. fib the additional use of pro-air could increase tachycardia risks. Also ICS may improve asthma control thus improve activity tolerance   Plan  Patient Instructions  Trial of QVAR 53mcg 2 puffs Twice daily   Continue on Zyrtec at bedtime Continue on Singulair daily Saline nasal spray twice daily as needed for congestion Saline nasal gel at nighttime Nasacort nasal spray 2 puffs daily in am .  Proair 2 puffs every 4hrs as needed for wheezing - this is your rescue /emergency inhaler.  Follow-up with Dr. Lake Bells or Parrett NP  in 3-4  months and As needed  . Chest xray when you return.           Follow Up Instructions: Follow up with Dr. Lake Bells in 3 -4 months and As needed     I discussed the assessment and treatment plan with the patient. The patient was provided an opportunity to ask questions and all were answered. The patient agreed with the plan and demonstrated an understanding of the instructions.   The patient was advised to call back or seek an in-person evaluation if the symptoms worsen or if the condition fails to improve as anticipated.  I provided 24  minutes of non-face-to-face time during this encounter.   Tammy  Parrett, NP

## 2019-04-24 NOTE — Patient Instructions (Signed)
Trial of QVAR 22mcg 2 puffs Twice daily   Continue on Zyrtec at bedtime Continue on Singulair daily Saline nasal spray twice daily as needed for congestion Saline nasal gel at nighttime Nasacort nasal spray 2 puffs daily in am .  Proair 2 puffs every 4hrs as needed for wheezing - this is your rescue /emergency inhaler.  Follow-up with Dr. Lake Bells or Ebelyn Bohnet NP  in 3-4  months and As needed  . Chest xray when you return.

## 2019-04-25 NOTE — Progress Notes (Signed)
Reviewed, agree 

## 2019-04-26 ENCOUNTER — Telehealth: Payer: Self-pay

## 2019-04-26 NOTE — Telephone Encounter (Signed)
Medication name and strength: [id number]  Provider: tp Pharmacy: [facility] [place] Patient insurance ID:  Phone: [id number] Fax: [phone]  Was the PA started on CMM?  yes If yes, please enter the Key: [id number] Timeframe for approval/denial: 48-72 hours

## 2019-05-02 NOTE — Telephone Encounter (Signed)
PA for Qvar Redihaler 92mcg has been approved until 11/29/2019.   Spoke with pharmacist at [facility], he is aware.  Nothing further needed at time of call.

## 2019-05-03 ENCOUNTER — Telehealth: Payer: Self-pay | Admitting: Adult Health

## 2019-05-03 NOTE — Telephone Encounter (Signed)
Call returned to patient, made aware she will need to call her insurance and find out what inhalers are on her formulary. Voiced understanding. States she will call back one she has this information. Nothing further is needed at this time.

## 2019-07-04 ENCOUNTER — Other Ambulatory Visit: Payer: Self-pay | Admitting: Cardiovascular Disease

## 2019-07-04 NOTE — Telephone Encounter (Signed)
Pt's age 84, wt 66.7 kg, SCr0.8, CrCl 61.12, last appt w/ SK 04/19/19.

## 2019-07-19 ENCOUNTER — Other Ambulatory Visit: Payer: Self-pay

## 2019-07-19 ENCOUNTER — Ambulatory Visit (INDEPENDENT_AMBULATORY_CARE_PROVIDER_SITE_OTHER): Payer: Medicare Other | Admitting: Internal Medicine

## 2019-07-19 ENCOUNTER — Encounter: Payer: Self-pay | Admitting: Internal Medicine

## 2019-07-19 VITALS — BP 124/78 | HR 66 | Ht 61.0 in | Wt 147.2 lb

## 2019-07-19 DIAGNOSIS — I480 Paroxysmal atrial fibrillation: Secondary | ICD-10-CM

## 2019-07-19 DIAGNOSIS — R001 Bradycardia, unspecified: Secondary | ICD-10-CM

## 2019-07-19 NOTE — Progress Notes (Signed)
Patient Care Team: Maryland Pink, MD as PCP - General (Family Medicine)   HPI  Amanda Dalton is a 85 y.o. female Seen because of recurrent palps with HR >110-120s; indeed, not really palps, but she has been using home monitor and has noted the increased HR, mostly without accompanying symptoms  Previously treated with atenolol and amiodarone and then discontinued with symptomatic improvement   Atrial fibrillation 5/19 again with spontaneous conversion.    On either occasion when she aware of her atrial fibrillation is with palpitations or impaired exercise intolerance    Monitoring>> atrial flutter/fibrillation frequent with rates ranging 47--118.  With mean about 75  She saw Dr. Lake Bells 1/19 for abnormal PFTs with outflow obstruction.  Thought to be chronic asthma.  on onhalers   Seen last 5/20    Date Cr Hgb TSH  5/19 0.83 12.4   12/19  0.8    3/20 0.8 13.9 2.28   In recent months, her breathing has been pretty good.  She has had no palpitations prompting the use of atenolol.  She has needed her inhaler almost never.  No edema.  No chest pain.  On anticoagulation.  No bleeding.  Thromboembolic risk factors ( age -61, Gender-1 ) for a CHADSVASc Score of 3   . DATE TEST EF   1/18 Echo   50-55 % LAE mild PA 50-87mm          Records and Results Reviewed   Past Medical History:  Diagnosis Date  . Anxiety   . Asthma   . Atrial flutter ([place])    a. Dx 11/17/2016-->CHA2DS2VASc = 3-->Eliquis 5 mg BID.  Marland Kitchen Breast cancer ([place])    a. 1996 s/p R modified radical mastectomy-->chemo w/ tamoxifen.  . Endometrial cancer ([place])    a. 08/2001 s/p hysterectomy. No node involvement.  . Fracture of right humerus 11/2008  . Hypothyroidism   . Osteoporosis     Past Surgical History:  Procedure Laterality Date  . CARDIOVERSION N/A 04/19/2018   Procedure: CARDIOVERSION;  Surgeon: Minna Merritts, MD;  Location: [facility] ORS;  Service: Cardiovascular;  Laterality:  N/A;  . Right Mastectomy  1996  . VAGINAL HYSTERECTOMY  08/2001    Current Meds  Medication Sig  . albuterol (PROAIR HFA) 108 (90 Base) MCG/ACT inhaler Inhale 1-2 puffs into the lungs every 6 (six) hours as needed for wheezing or shortness of breath.  Marland Kitchen atenolol (TENORMIN) 25 MG tablet Take 1/2 tablet (12.5 mg) once daily as needed for symptoms related to fast heart rates  . beclomethasone (QVAR REDIHALER) 40 MCG/ACT inhaler Inhale 2 puffs into the lungs 2 (two) times daily.  . Calcium Carb-Cholecalciferol (CALCIUM-VITAMIN D) 600-400 MG-UNIT TABS Take 1 tablet by mouth daily.   . cetirizine (ZYRTEC) 10 MG tablet Take 10 mg by mouth daily.   . citalopram (CELEXA) 40 MG tablet Take 40 mg by mouth every evening.   Marland Kitchen ELIQUIS 5 MG TABS tablet Take 1 tablet by mouth twice daily  . Flaxseed, Linseed, (FLAX SEED OIL PO) Take 1 capsule by mouth daily.  Marland Kitchen levothyroxine (SYNTHROID, LEVOTHROID) 75 MCG tablet Take 75 mcg by mouth daily before breakfast.  . magnesium oxide (MAG-OX) 400 MG tablet Take 400 mg by mouth daily.  . montelukast (SINGULAIR) 10 MG tablet Take 10 mg by mouth every evening.   . Multiple Vitamin (MULTIVITAMIN WITH MINERALS) TABS tablet Take 1 tablet by mouth daily.  . vitamin C (ASCORBIC ACID) 500 MG tablet Take  500 mg by mouth daily.    Allergies  Allergen Reactions  . Paxil [Paroxetine Hcl] Nausea Only      Review of Systems negative except from HPI and PMH  Physical Exam BP 124/78 (BP Location: Left Arm, Patient Position: Sitting, Cuff Size: Normal)   Pulse 66   Ht 5\' 1"  (1.549 m)   Wt 147 lb 4 oz (66.8 kg)   BMI 27.82 kg/m   Well developed and nourished in no acute distress HENT normal Neck supple with JVP-  flat   Clear Regular rate and rhythm, 2/6 murmurs or gallops Abd-soft with active BS No Clubbing cyanosis edema Skin-warm and dry A & Oriented  Grossly normal sensory and motor function  ECG sinus @ 66 15/08/40   Assessment and  Plan  Atrial  fibrillation-paroxysmal  Bradycardia-sinus  Dyspnea on exertion    On Anticoagulation;  No bleeding issues   No afib of which she is aware-- continue apixoban and prn atenolol      Current medicines are reviewed at length with the patient today .  The patient does not  have concerns regarding medicines.

## 2019-07-19 NOTE — Patient Instructions (Signed)

## 2019-09-15 ENCOUNTER — Other Ambulatory Visit: Payer: Self-pay | Admitting: Internal Medicine

## 2019-09-17 NOTE — Telephone Encounter (Signed)
Eliquis 5mg  refill request received, pt is 85 yrs old, weight-66.8kg, Crea-0.79 on 07/30/2019 at [place], and last seen by Dr. Caryl Comes on 07/19/2019. Dose is appropriate based on dosing criteria. Will send in refill to requested pharmacy.

## 2019-11-12 NOTE — Progress Notes (Signed)
Cardiology Office Note  Date:  11/13/2019   ID:  Amanda Dalton, DOB 1931-08-18, MRN [id number]  PCP:  Maryland Pink, MD   Chief Complaint  Patient presents with  . Other    12 month follow up. Patient c.o SOB with exertion. Meds reviewed verbally with patient.     HPI:  85 y/o ? with a h/o  hypothyroidism,  anxiety,  asthma,  atrial flutter rate 120 bpm, started on anticoagulation, the Toprol for rate control  previous episode of atrial flutter presented in the setting of attending a party 10/2016: fib flutter 11/2016: NSR Atrial fibrillation 5/19 again with spontaneous conversion. Cardioversion cancelled 04/19/2018 who presents for  follow-up of her arrhythmia, atrial fibrillation  In atrial fib today, no sx Takes atenolol sparingly at night for palpitations  no regular exercise program  Off atenolol and amiodarone given her bradycardia She feels the medicines made her feel foggy in the past No edema, venous issue  On Eliquis, no recent falls Prior history of GI bleeds, none recently, confirmed again  EKG personally reviewed by myself on todays visit Showed atrial fibrillation 77  bpm no significant ST or T wave changes  Other past medical history reviewed Cardioversion cancelled 04/19/2018 Was in NSR on amiodarone and atenolol on arrival to the hospital She had bradycardia and amiodarone decreased down to 100 mg daily atenolol down to 12.5 mg daily  Previously received adenosine IV push for tachycardia revealing flutter waves CHA2DS2VASc of 3 (age/age/gender), started on eliquis 5 mg BID (creat 0.8 12/4, wt > 60 kg)  Echocardiogram 12/08/2016 Ejection fraction 50-55%, moderately elevated right heart pressures 50-55 mmHg  Had a fall Nov 2017: large laceration on forehead   PMH:   has a past medical history of Anxiety, Asthma, Atrial flutter ([place]), Breast cancer ([place]), Endometrial cancer ([place]), Fracture of right humerus (11/2008), Hypothyroidism, and  Osteoporosis.  PSH:    Past Surgical History:  Procedure Laterality Date  . CARDIOVERSION N/A 04/19/2018   Procedure: CARDIOVERSION;  Surgeon: Minna Merritts, MD;  Location: [facility] ORS;  Service: Cardiovascular;  Laterality: N/A;  . Right Mastectomy  1996  . VAGINAL HYSTERECTOMY  08/2001    Current Outpatient Medications  Medication Sig Dispense Refill  . atenolol (TENORMIN) 25 MG tablet Take 1/2 tablet (12.5 mg) once daily as needed for symptoms related to fast heart rates 180 tablet 3  . beclomethasone (QVAR REDIHALER) 40 MCG/ACT inhaler Inhale 2 puffs into the lungs 2 (two) times daily. 1 Inhaler 5  . Calcium Carb-Cholecalciferol (CALCIUM-VITAMIN D) 600-400 MG-UNIT TABS Take 1 tablet by mouth daily.     . cetirizine (ZYRTEC) 10 MG tablet Take 10 mg by mouth daily.     . citalopram (CELEXA) 40 MG tablet Take 40 mg by mouth every evening.     Marland Kitchen ELIQUIS 5 MG TABS tablet Take 1 tablet by mouth twice daily 180 tablet 2  . Flaxseed, Linseed, (FLAX SEED OIL PO) Take 1 capsule by mouth daily.    Marland Kitchen levothyroxine (SYNTHROID, LEVOTHROID) 75 MCG tablet Take 75 mcg by mouth daily before breakfast.    . magnesium oxide (MAG-OX) 400 MG tablet Take 400 mg by mouth daily.    . montelukast (SINGULAIR) 10 MG tablet Take 10 mg by mouth every evening.     . Multiple Vitamin (MULTIVITAMIN WITH MINERALS) TABS tablet Take 1 tablet by mouth daily.    . vitamin C (ASCORBIC ACID) 500 MG tablet Take 500 mg by mouth daily.  No current facility-administered medications for this visit.     Allergies:   Paxil [paroxetine hcl]   Social History:  The patient  reports that she has never smoked. She has never used smokeless tobacco. She reports current alcohol use of about 2.0 standard drinks of alcohol per week. She reports that she does not use drugs.   Family History:   family history includes Breast cancer in her sister; Hypertension in her mother; Osteoporosis in her sister; Stroke in her father and mother.     Review of Systems: Review of Systems  Constitutional: Negative.   HENT: Negative.   Respiratory: Negative.   Cardiovascular: Negative.   Gastrointestinal: Negative.   Musculoskeletal: Negative.        Gait instability  Neurological: Negative.   Psychiatric/Behavioral: Negative.   All other systems reviewed and are negative.   PHYSICAL EXAM: VS:  BP 120/86 (BP Location: Left Arm, Patient Position: Sitting, Cuff Size: Normal)   Pulse 77   Ht 5\' 1"  (1.549 m)   Wt 149 lb (67.6 kg)   SpO2 98%   BMI 28.15 kg/m  , BMI Body mass index is 28.15 kg/m.  Constitutional:  oriented to person, place, and time. No distress.  HENT:  Head: Normocephalic and atraumatic.  Eyes:  no discharge. No scleral icterus.  Neck: Normal range of motion. Neck supple. No JVD present.  Cardiovascular: irreg irreg, normal heart sounds and intact distal pulses. Exam reveals no gallop and no friction rub. No edema No murmur heard. Pulmonary/Chest: Effort normal and breath sounds normal. No stridor. No respiratory distress.  no wheezes.  no rales.  no tenderness.  Abdominal: Soft.  no distension.  no tenderness.  Musculoskeletal: Normal range of motion.  no  tenderness or deformity.  Neurological:  normal muscle tone. Coordination normal. No atrophy Skin: Skin is warm and dry. No rash noted. not diaphoretic.  Psychiatric:  normal mood and affect. behavior is normal. Thought content normal.   Recent Labs: No results found for requested labs within last 8760 hours.    Lipid Panel No results found for: CHOL, HDL, LDLCALC, TRIG    Wt Readings from Last 3 Encounters:  11/13/19 149 lb (67.6 kg)  07/19/19 147 lb 4 oz (66.8 kg)  04/19/19 147 lb (66.7 kg)     ASSESSMENT AND PLAN:  Persistent atrial fibrillation History of sick sinus syndrome Back in atial fib, no sx She is inclinded to not start medications at this time, leave alone think about it watch her symptoms She did have bradycardia in the  past on beta-blocker/amiodarone Recommended medical management at this time, stay on Eliquis Rate is well controlled on no atenolol  Encounter for anticoagulation discussion and counseling Compliant with her eliquis No bleeding, no recent GI bleeds or falls Stable  Anxiety Managed by primary care Very stable  Hypothyroidism, unspecified type On thyroid supplement, stable  Hyperlipidemia Cholesterol high 235 Previously declined workup including CT coronary calcium scoring Does not want cholesterol medication Again discussed with her   Uncomplicated asthma, unspecified asthma severity, unspecified whether persistent No recent issues, stable  Gait instability/risk of falls No recent falls, recommended regular walking program Recommended walking program, difficulty getting down from exam table   Total encounter time more than 25 minutes  Greater than 50% was spent in counseling and coordination of care with the patient   Disposition:   F/U  8 month   Orders Placed This Encounter  Procedures  . EKG 12-Lead  Signed, Esmond Plants, M.D., Ph.D. 11/13/2019  [place], [place]

## 2019-11-13 ENCOUNTER — Encounter: Payer: Self-pay | Admitting: Cardiovascular Disease

## 2019-11-13 ENCOUNTER — Ambulatory Visit (INDEPENDENT_AMBULATORY_CARE_PROVIDER_SITE_OTHER): Payer: Medicare Other | Admitting: Cardiovascular Disease

## 2019-11-13 ENCOUNTER — Other Ambulatory Visit: Payer: Self-pay

## 2019-11-13 VITALS — BP 120/86 | HR 77 | Ht 61.0 in | Wt 149.0 lb

## 2019-11-13 DIAGNOSIS — J45909 Unspecified asthma, uncomplicated: Secondary | ICD-10-CM

## 2019-11-13 DIAGNOSIS — E782 Mixed hyperlipidemia: Secondary | ICD-10-CM

## 2019-11-13 DIAGNOSIS — R0600 Dyspnea, unspecified: Secondary | ICD-10-CM

## 2019-11-13 DIAGNOSIS — R001 Bradycardia, unspecified: Secondary | ICD-10-CM | POA: Diagnosis not present

## 2019-11-13 DIAGNOSIS — I4892 Unspecified atrial flutter: Secondary | ICD-10-CM

## 2019-11-13 DIAGNOSIS — I480 Paroxysmal atrial fibrillation: Secondary | ICD-10-CM | POA: Diagnosis not present

## 2019-11-13 DIAGNOSIS — R0609 Other forms of dyspnea: Secondary | ICD-10-CM

## 2019-11-13 DIAGNOSIS — M353 Polymyalgia rheumatica: Secondary | ICD-10-CM

## 2019-11-13 NOTE — Patient Instructions (Signed)
Medication Instructions:  No changes  If you need a refill on your cardiac medications before your next appointment, please call your pharmacy.    Lab work: No new labs needed   If you have labs (blood work) drawn today and your tests are completely normal, you will receive your results only by: Marland Kitchen MyChart Message (if you have MyChart) OR . A paper copy in the mail If you have any lab test that is abnormal or we need to change your treatment, we will call you to review the results.   Testing/Procedures: No new testing needed   Follow-Up: At [facility], you and your health needs are our priority.  As part of our continuing mission to provide you with exceptional heart care, we have created designated Provider Care Teams.  These Care Teams include your primary Cardiologist (physician) and Advanced Practice Providers (APPs -  Physician Assistants and Nurse Practitioners) who all work together to provide you with the care you need, when you need it.  . You will need a follow up appointment in 8 months   . Providers on your designated Care Team:   . Murray Hodgkins, NP . Christell Faith, PA-C . Marrianne Mood, PA-C  Any Other Special Instructions Will Be Listed Below (If Applicable).  For educational health videos Log in to : [email or web address] Or : [email or web address], password : [email or web address]

## 2020-01-03 ENCOUNTER — Encounter: Payer: Self-pay | Admitting: Internal Medicine

## 2020-01-03 ENCOUNTER — Ambulatory Visit (INDEPENDENT_AMBULATORY_CARE_PROVIDER_SITE_OTHER): Payer: Medicare Other | Admitting: Internal Medicine

## 2020-01-03 ENCOUNTER — Other Ambulatory Visit: Payer: Self-pay

## 2020-01-03 VITALS — BP 120/82 | HR 88 | Ht 60.0 in | Wt 149.8 lb

## 2020-01-03 DIAGNOSIS — Z79899 Other long term (current) drug therapy: Secondary | ICD-10-CM | POA: Diagnosis not present

## 2020-01-03 DIAGNOSIS — I480 Paroxysmal atrial fibrillation: Secondary | ICD-10-CM | POA: Diagnosis not present

## 2020-01-03 DIAGNOSIS — R0609 Other forms of dyspnea: Secondary | ICD-10-CM

## 2020-01-03 DIAGNOSIS — R001 Bradycardia, unspecified: Secondary | ICD-10-CM

## 2020-01-03 DIAGNOSIS — R0600 Dyspnea, unspecified: Secondary | ICD-10-CM | POA: Diagnosis not present

## 2020-01-03 NOTE — Patient Instructions (Signed)
Medication Instructions:  - Your physician recommends that you continue on your current medications as directed. Please refer to the Current Medication list given to you today.  *If you need a refill on your cardiac medications before your next appointment, please call your pharmacy*  Lab Work: - Pre procedure lab: today- BMP/ CBC  - Pre procedure COVID swab: Tuesday 01/15/20 (12:30 pm- 2:30 pm) - [facility], drive up testing only    If you have labs (blood work) drawn today and your tests are completely normal, you will receive your results only by: Marland Kitchen MyChart Message (if you have MyChart) OR . A paper copy in the mail If you have any lab test that is abnormal or we need to change your treatment, we will call you to review the results.  Testing/Procedures: - Your physician has recommended that you have a Cardioversion (DCCV). Electrical Cardioversion uses a jolt of electricity to your heart either through paddles or wired patches attached to your chest. This is a controlled, usually prescheduled, procedure. Defibrillation is done under light anesthesia in the hospital, and you usually go home the day of the procedure. This is done to get your heart back into a normal rhythm. You are not awake for the procedure.   - You are scheduled for a Cardioversion on Thursday 01/17/20 with Dr. Rockey Situ.  - Please arrive at the [facility] at 6:30  a.m. on the day of your procedure.  DIET INSTRUCTIONS:  Nothing to eat or drink after midnight the night prior to your procedure         1) Labs: as above  2) Medications:  You may take all of your regular medications with enough water to get them down safely the morning of your procedure  3) Must have a responsible person to drive you home.  4) Bring a current list of your medications and current insurance cards.    If you have any questions after you get home, please call the office at [phone]. Alvis Lemmings, RN,  BSN   Follow-Up: At [facility], you and your health needs are our priority.  As part of our continuing mission to provide you with exceptional heart care, we have created designated Provider Care Teams.  These Care Teams include your primary Cardiologist (physician) and Advanced Practice Providers (APPs -  Physician Assistants and Nurse Practitioners) who all work together to provide you with the care you need, when you need it.  Your next appointment:   - 2-3 weeks (from 01/17/20) with Dr. Rockey Situ APP   - 3-4 months with Dr. Caryl Comes  The format for your next appointment:   In Person  Provider:   As above  Other Instructions n/a

## 2020-01-03 NOTE — Progress Notes (Signed)
Patient Care Team: [place] Pink, MD as PCP - General (Family Medicine)   HPI  Amanda Dalton is a 86 y.o. female Seen because of recurrent palps with HR >110-120s; indeed, not really palps, but she has been using home monitor and has noted the increased HR, mostly without accompanying symptoms  Previously treated with atenolol and amiodarone and then discontinued with symptomatic improvement   Atrial fibrillation 5/19 presented with spontaneous conversion.   9/19 Personally reviewed  Monitoring>>paroxysms of atrial fib, some sinus brady  She saw Dr. [place] Bells 1/19 for abnormal PFTs with outflow obstruction.  Thought to be chronic asthma.  on onhalers   Seen last 5/20    Date Cr Hgb TSH  5/19 0.83 12.4   12/19  0.8    3/20 0.8 13.9 2.28  10/20 0.8     In recent months, her breathing has been worse  Saw Dr Deidre Ala 12/20  Found to be in afib "no sx She is inclined to not start medications at this time, leave alone think about it watch her symptoms"  No edema but some palpitations   Thromboembolic risk factors ( age -10, Gender-1 ) for a CHADSVASc Score of 3   . DATE TEST EF   1/18 Echo   50-55 % LAE mild PA 50-56mm          Records and Results Reviewed   Past Medical History:  Diagnosis Date  . Anxiety   . Asthma   . Atrial flutter ([place])    a. Dx 11/17/2016-->CHA2DS2VASc = 3-->Eliquis 5 mg BID.  Marland Kitchen Breast cancer ([place])    a. 1996 s/p R modified radical mastectomy-->chemo w/ tamoxifen.  . Endometrial cancer ([place])    a. 08/2001 s/p hysterectomy. No node involvement.  . Fracture of right humerus 11/2008  . Hypothyroidism   . Osteoporosis     Past Surgical History:  Procedure Laterality Date  . CARDIOVERSION N/A 04/19/2018   Procedure: CARDIOVERSION;  Surgeon: Minna Merritts, MD;  Location: [facility] ORS;  Service: Cardiovascular;  Laterality: N/A;  . Right Mastectomy  1996  . VAGINAL HYSTERECTOMY  08/2001    Current Meds  Medication Sig  . atenolol  (TENORMIN) 25 MG tablet Take 1/2 tablet (12.5 mg) once daily as needed for symptoms related to fast heart rates  . beclomethasone (QVAR REDIHALER) 40 MCG/ACT inhaler Inhale 2 puffs into the lungs 2 (two) times daily. (Patient taking differently: Inhale 2 puffs into the lungs 2 (two) times daily as needed. )  . Calcium Carb-Cholecalciferol (CALCIUM-VITAMIN D) 600-400 MG-UNIT TABS Take 1 tablet by mouth daily.   . cetirizine (ZYRTEC) 10 MG tablet Take 10 mg by mouth daily.   . citalopram (CELEXA) 40 MG tablet Take 40 mg by mouth every evening.   Marland Kitchen ELIQUIS 5 MG TABS tablet Take 1 tablet by mouth twice daily  . Flaxseed, Linseed, (FLAX SEED OIL PO) Take 1 capsule by mouth daily.  Marland Kitchen levothyroxine (SYNTHROID, LEVOTHROID) 75 MCG tablet Take 75 mcg by mouth daily before breakfast.  . magnesium oxide (MAG-OX) 400 MG tablet Take 400 mg by mouth daily.  . montelukast (SINGULAIR) 10 MG tablet Take 10 mg by mouth every evening.   . Multiple Vitamin (MULTIVITAMIN WITH MINERALS) TABS tablet Take 1 tablet by mouth daily.  . vitamin C (ASCORBIC ACID) 500 MG tablet Take 500 mg by mouth daily.    Allergies  Allergen Reactions  . Paxil [Paroxetine Hcl] Nausea Only      Review  of Systems negative except from HPI and PMH  Physical Exam BP 120/82 (BP Location: Left Arm, Patient Position: Sitting, Cuff Size: Normal)   Pulse 88   Ht 5' (1.524 m)   Wt 149 lb 12 oz (67.9 kg)   SpO2 98%   BMI 29.25 kg/m  Well developed and nourished in no acute distress HENT normal Neck supple with JVP-  Flat  Clear Irregularly irregular rate and rhythm, no murmurs or gallops Abd-soft with active BS No Clubbing cyanosis edema Skin-warm and dry A & Oriented  Grossly normal sensory and motor function  ECG atrial fib 88 -/08/38   Assessment and  Plan  Atrial fibrillation-persistent  Bradycardia-sinus  Dyspnea on exertion  COPD  Dyspnea worsening is multifactorial, with copd and atrial fib interacting.  Have  encouraged her to reestablish with pulmonary and we will undertake DCCV-- I am not so sanguine about her ability to maintain long term"persistent " sinus based on the monitor 9/19 where recurrent paroxysms noted  If indeed develops recurrent persistent fib, would prob try a 1c drug so as to avoid the bradycardia previously described with amio and atenolol  We spent more than 50% of our >30  min visit in face to face counseling regarding the above      Current medicines are reviewed at length with the patient today .  The patient does not  have concerns regarding medicines.

## 2020-01-04 LAB — BASIC METABOLIC PANEL
BUN/Creatinine Ratio: 22 (ref 12–28)
BUN: 19 mg/dL (ref 8–27)
CO2: 25 mmol/L (ref 20–29)
Calcium: 9.9 mg/dL (ref 8.7–10.3)
Chloride: 98 mmol/L (ref 96–106)
Creatinine, Ser: 0.86 mg/dL (ref 0.57–1.00)
GFR calc Af Amer: 71 mL/min/{1.73_m2} (ref >59)
GFR calc non Af Amer: 61 mL/min/{1.73_m2} (ref >59)
Glucose: 101 mg/dL — ABNORMAL HIGH (ref 65–99)
Potassium: 4.7 mmol/L (ref 3.5–5.2)
Sodium: 136 mmol/L (ref 134–144)

## 2020-01-04 LAB — CBC WITH DIFFERENTIAL/PLATELET
Basophils Absolute: 0.1 10*3/uL (ref 0.0–0.2)
Basos: 1 %
EOS (ABSOLUTE): 0.1 10*3/uL (ref 0.0–0.4)
Eos: 2 %
Hematocrit: 41 % (ref 34.0–46.6)
Hemoglobin: 14 g/dL (ref 11.1–15.9)
Immature Grans (Abs): 0 10*3/uL (ref 0.0–0.1)
Immature Granulocytes: 0 %
Lymphocytes Absolute: 2.1 10*3/uL (ref 0.7–3.1)
Lymphs: 29 %
MCH: 32.6 pg (ref 26.6–33.0)
MCHC: 34.1 g/dL (ref 31.5–35.7)
MCV: 96 fL (ref 79–97)
Monocytes Absolute: 0.7 10*3/uL (ref 0.1–0.9)
Monocytes: 9 %
Neutrophils Absolute: 4.5 10*3/uL (ref 1.4–7.0)
Neutrophils: 59 %
Platelets: 272 10*3/uL (ref 150–450)
RBC: 4.29 x10E6/uL (ref 3.77–5.28)
RDW: 12.3 % (ref 11.7–15.4)
WBC: 7.5 10*3/uL (ref 3.4–10.8)

## 2020-01-04 NOTE — Addendum Note (Signed)
Addended by: Casimer Lanius on: 01/04/2020 04:17 PM   Modules accepted: Orders

## 2020-01-07 ENCOUNTER — Other Ambulatory Visit: Payer: Self-pay | Admitting: Internal Medicine

## 2020-01-07 DIAGNOSIS — I480 Paroxysmal atrial fibrillation: Secondary | ICD-10-CM

## 2020-01-14 ENCOUNTER — Ambulatory Visit
Admission: RE | Admit: 2020-01-14 | Discharge: 2020-01-14 | Disposition: A | Discharge status: Home / Self Care | Payer: Medicare Other | Source: Ambulatory Visit | Attending: Family Medicine | Admitting: Family Medicine

## 2020-01-14 ENCOUNTER — Other Ambulatory Visit: Payer: Self-pay | Admitting: Family Medicine

## 2020-01-14 ENCOUNTER — Other Ambulatory Visit: Payer: Self-pay

## 2020-01-14 DIAGNOSIS — M25561 Pain in right knee: Secondary | ICD-10-CM | POA: Diagnosis present

## 2020-01-14 DIAGNOSIS — M79661 Pain in right lower leg: Secondary | ICD-10-CM | POA: Diagnosis present

## 2020-01-14 DIAGNOSIS — M7989 Other specified soft tissue disorders: Secondary | ICD-10-CM

## 2020-01-15 ENCOUNTER — Telehealth: Payer: Self-pay | Admitting: Cardiovascular Disease

## 2020-01-15 ENCOUNTER — Other Ambulatory Visit
Admission: RE | Admit: 2020-01-15 | Discharge: 2020-01-15 | Disposition: A | Discharge status: Home / Self Care | Payer: Medicare Other | Source: Ambulatory Visit | Attending: Internal Medicine | Admitting: Internal Medicine

## 2020-01-15 DIAGNOSIS — Z01812 Encounter for preprocedural laboratory examination: Secondary | ICD-10-CM | POA: Insufficient documentation

## 2020-01-15 DIAGNOSIS — Z20822 Contact with and (suspected) exposure to covid-19: Secondary | ICD-10-CM | POA: Diagnosis not present

## 2020-01-15 NOTE — Telephone Encounter (Signed)
Spoke with patient and recommended that she keep scheduled cardioversion and that Dr. Rockey Situ could possibly look at it as well tomorrow. She was agreeable with this plan and had no further questions at this time.

## 2020-01-15 NOTE — Telephone Encounter (Signed)
Patient calling in stating she is having right leg pain. Patient states the pain has worsened over the past few days. Patient went to PCP and they reported no DVT however patient needs help getting around in the morning. With this pain, patient wants to be advised on whether she should have the cardioversion tomorrow.  Please advise, patient needs to know sooner rather than later due to needing to get her covid test.

## 2020-01-15 NOTE — Telephone Encounter (Signed)
Spoke with patient and advised that we will keep it scheduled and I will call them in the morning to find out what their inclement weather policy is for these type of procedures. She verbalized understanding, was agreeable with plan, and had no further questions at this time.

## 2020-01-15 NOTE — Telephone Encounter (Signed)
Patient calling in now regarding the cardioversion. Patient is nervous of the predicted weather. Patient would like to reschedule, Please advise when able

## 2020-01-16 LAB — SARS CORONAVIRUS 2 (TAT 6-24 HRS): SARS Coronavirus 2: NEGATIVE

## 2020-01-16 NOTE — Telephone Encounter (Signed)
Called and spoke with patient regarding cardioversion that was scheduled tomorrow. Advised that Dr. Rockey Situ would like to reschedule to Friday at 08:00 AM with arrival time of 07:00 AM. Received approval from Dr. Bertell Maria with anesthesia to add on second case for Friday and scheduling added on for that date and time. Patient verbalized understanding with no further questions at this time.

## 2020-01-16 NOTE — Telephone Encounter (Signed)
Spoke with patient and she wanted to know if she could have some black coffee on Friday morning before she comes in. Instructed her to only take small sip of water with her pills. She verbalized understanding with no further questions at this time.

## 2020-01-16 NOTE — Telephone Encounter (Signed)
Patient calling back Would like to know if a cup of black coffee is allowed before cardioversion  Please call to clarify

## 2020-01-17 ENCOUNTER — Telehealth: Payer: Self-pay | Admitting: Internal Medicine

## 2020-01-17 ENCOUNTER — Other Ambulatory Visit: Payer: Self-pay | Admitting: Cardiovascular Disease

## 2020-01-17 NOTE — Telephone Encounter (Signed)
Patient is calling regarding her cardioversion in the a.m. please call to discuss if she needs to r/s

## 2020-01-17 NOTE — Telephone Encounter (Signed)
Message received from Dr. Rockey Situ he will be in the hospital in the morning and available to do the patient's DCCV.  I have called and notified the patient of this. She states transportation (I presume from [facility]) will be bringing her. She advised she will be there, but transportation has told her if the roads are slick, they will not be transporting.   I advised her if they will not transport her in the morning, to please call in the AM to let us know- she may be directed to the provider on call.   The patient voices understanding of the above and is agreeable.

## 2020-01-17 NOTE — Telephone Encounter (Signed)
Secure chat sent to Dr. Rockey Situ to confirm.  Waiting on MD response.

## 2020-01-18 ENCOUNTER — Encounter
Admission: RE | Disposition: A | Discharge status: Home / Self Care | Payer: Self-pay | Source: Home / Self Care | Attending: Cardiovascular Disease

## 2020-01-18 ENCOUNTER — Ambulatory Visit: Payer: Medicare Other | Admitting: Anesthesiology

## 2020-01-18 ENCOUNTER — Ambulatory Visit
Admission: RE | Admit: 2020-01-18 | Discharge: 2020-01-18 | Disposition: A | Discharge status: Home / Self Care | Payer: Medicare Other | Attending: Cardiovascular Disease | Admitting: Cardiovascular Disease

## 2020-01-18 ENCOUNTER — Other Ambulatory Visit: Payer: Self-pay

## 2020-01-18 ENCOUNTER — Encounter: Payer: Self-pay | Admitting: Cardiovascular Disease

## 2020-01-18 DIAGNOSIS — I480 Paroxysmal atrial fibrillation: Secondary | ICD-10-CM

## 2020-01-18 DIAGNOSIS — Z7901 Long term (current) use of anticoagulants: Secondary | ICD-10-CM | POA: Diagnosis not present

## 2020-01-18 DIAGNOSIS — R0609 Other forms of dyspnea: Secondary | ICD-10-CM | POA: Diagnosis not present

## 2020-01-18 DIAGNOSIS — R001 Bradycardia, unspecified: Secondary | ICD-10-CM | POA: Diagnosis not present

## 2020-01-18 DIAGNOSIS — E039 Hypothyroidism, unspecified: Secondary | ICD-10-CM | POA: Insufficient documentation

## 2020-01-18 DIAGNOSIS — Z79899 Other long term (current) drug therapy: Secondary | ICD-10-CM | POA: Insufficient documentation

## 2020-01-18 DIAGNOSIS — Z79890 Hormone replacement therapy (postmenopausal): Secondary | ICD-10-CM | POA: Insufficient documentation

## 2020-01-18 DIAGNOSIS — M810 Age-related osteoporosis without current pathological fracture: Secondary | ICD-10-CM | POA: Diagnosis not present

## 2020-01-18 DIAGNOSIS — Z853 Personal history of malignant neoplasm of breast: Secondary | ICD-10-CM | POA: Diagnosis not present

## 2020-01-18 DIAGNOSIS — J449 Chronic obstructive pulmonary disease, unspecified: Secondary | ICD-10-CM | POA: Diagnosis not present

## 2020-01-18 DIAGNOSIS — F419 Anxiety disorder, unspecified: Secondary | ICD-10-CM | POA: Diagnosis not present

## 2020-01-18 DIAGNOSIS — I4819 Other persistent atrial fibrillation: Secondary | ICD-10-CM | POA: Insufficient documentation

## 2020-01-18 HISTORY — PX: CARDIOVERSION: SHX1299

## 2020-01-18 SURGERY — CARDIOVERSION
Anesthesia: General

## 2020-01-18 MED ORDER — SODIUM CHLORIDE 0.9 % IV SOLN: INTRAVENOUS | Status: DC

## 2020-01-18 MED ORDER — PROPOFOL 10 MG/ML IV BOLUS
INTRAVENOUS | Status: DC | PRN
  Administered 2020-01-18 (×2): 30 mg via INTRAVENOUS
  Administered 2020-01-18: 10 mg via INTRAVENOUS

## 2020-01-18 NOTE — Anesthesia Preprocedure Evaluation (Signed)
Anesthesia Evaluation  Patient identified by MRN, date of birth, ID band Patient awake    Reviewed: Allergy & Precautions, NPO status , Patient's Chart, lab work & pertinent test results  History of Anesthesia Complications Negative for: history of anesthetic complications  Airway Mallampati: III       Dental   Pulmonary asthma (last inhalers weeks ago) , neg sleep apnea, neg COPD, Not current smoker,           Cardiovascular (-) hypertension(-) Past MI and (-) CHF + dysrhythmias Atrial Fibrillation (-) Valvular Problems/Murmurs     Neuro/Psych neg Seizures Anxiety    GI/Hepatic Neg liver ROS, neg GERD  ,  Endo/Other  neg diabetesHypothyroidism   Renal/GU negative Renal ROS     Musculoskeletal   Abdominal   Peds  Hematology   Anesthesia Other Findings   Reproductive/Obstetrics                             Anesthesia Physical Anesthesia Plan  ASA: III  Anesthesia Plan: General   Post-op Pain Management:    Induction: Intravenous  PONV Risk Score and Plan: 3 and Propofol infusion, TIVA and Treatment may vary due to age or medical condition  Airway Management Planned: Nasal Cannula  Additional Equipment:   Intra-op Plan:   Post-operative Plan:   Informed Consent: I have reviewed the patients History and Physical, chart, labs and discussed the procedure including the risks, benefits and alternatives for the proposed anesthesia with the patient or authorized representative who has indicated his/her understanding and acceptance.       Plan Discussed with:   Anesthesia Plan Comments:         Anesthesia Quick Evaluation

## 2020-01-18 NOTE — Transfer of Care (Signed)
Immediate Anesthesia Transfer of Care Note  Patient: Amanda Dalton  Procedure(s) Performed: CARDIOVERSION (N/A )  Patient Location: PACU  Anesthesia Type:General  Level of Consciousness: sedated  Airway & Oxygen Therapy: Patient Spontanous Breathing and Patient connected to nasal cannula oxygen  Post-op Assessment: Report given to RN and Post -op Vital signs reviewed and stable  Post vital signs: Reviewed and stable  Last Vitals:  Vitals Value Taken Time  BP 120/74 01/18/20 0814  Temp 36.8 C 01/18/20 0814  Pulse 69 01/18/20 0815  Resp 23 01/18/20 0815  SpO2 99 % 01/18/20 0815  Vitals shown include unvalidated device data.  Last Pain:  Vitals:   01/18/20 0814  TempSrc: Temporal  PainSc:          Complications: No apparent anesthesia complications

## 2020-01-18 NOTE — Anesthesia Postprocedure Evaluation (Signed)
Anesthesia Post Note  Patient: Amanda Dalton  Procedure(s) Performed: CARDIOVERSION (N/A )  Patient location during evaluation: Specials Recovery Anesthesia Type: General Level of consciousness: awake and alert Pain management: pain level controlled Vital Signs Assessment: post-procedure vital signs reviewed and stable Respiratory status: spontaneous breathing and respiratory function stable Cardiovascular status: stable Anesthetic complications: no     Last Vitals:  Vitals:   01/18/20 0814 01/18/20 0815  BP: 120/74 121/67  Pulse: 69 69  Resp: 20 (!) 23  Temp: 36.8 C   SpO2: 98% 99%    Last Pain:  Vitals:   01/18/20 0814  TempSrc: Temporal  PainSc:                  Sharmila Wrobleski K

## 2020-01-18 NOTE — H&P (Signed)
H&P Addendum, pre-cardioversion  Patient was seen and evaluated prior to -cardioversion procedure Symptoms, prior testing details again confirmed with the patient Patient examined, no significant change from prior exam Lab work reviewed in detail personally by myself Patient understands risk and benefit of the procedure, willing to proceed  Signed, Tim Trystan Eads, MD, Ph.D [facility]  

## 2020-01-18 NOTE — Anesthesia Procedure Notes (Signed)
Date/Time: 01/18/2020 8:05 AM Performed by: Johnna Acosta, CRNA Pre-anesthesia Checklist: Patient identified, Emergency Drugs available, Suction available, Patient being monitored and Timeout performed Patient Re-evaluated:Patient Re-evaluated prior to induction Oxygen Delivery Method: Nasal cannula Preoxygenation: Pre-oxygenation with 100% oxygen Induction Type: IV induction

## 2020-01-18 NOTE — CV Procedure (Addendum)
Cardioversion procedure note For atrial fib/ fibrillation, persistent.  Procedure Details:  Consent: Risks of procedure as well as the alternatives and risks of each were explained to the (patient/caregiver). Consent for procedure obtained.  Time Out: Verified patient identification, verified procedure, site/side was marked, verified correct patient position, special equipment/implants available, medications/allergies/relevent history reviewed, required imaging and test results available. Performed  Patient placed on cardiac monitor, pulse oximetry, supplemental oxygen as necessary.  Sedation given: propofol IV, Dr. Ronelle Nigh Pacer pads placed anterior and posterior chest.   Cardioverted 2 time(s).  Cardioverted at  120J.,  Synchronized biphasic, NSR for 3 min, converted back to atrial fib/flutter, Cardioverted at  150J.,  Synchronized biphasic Converted to NSR   Evaluation: Findings: Post procedure EKG shows: NSR Complications: None Patient did tolerate procedure well.  Time Spent Directly with the Patient:  63 minutes   Esmond Plants, M.D., Ph.D.

## 2020-01-31 NOTE — Progress Notes (Signed)
Cardiology Office Note  Date:  02/01/2020   ID:  Amanda Dalton, DOB June 25, 1931, MRN [id number]  PCP:  Amanda Pink, MD   Chief Complaint  Patient presents with  . office visit    Hospital F/U-A Fib; Meds verbally reviewed with patient.    HPI:  86 y/o ? with a h/o  hypothyroidism,  anxiety,  asthma,  atrial flutter rate 120 bpm, started on anticoagulation,  previous episode of atrial flutter presented in the setting of attending a party 10/2016: fib flutter 11/2016: NSR Atrial fibrillation 5/19 again with spontaneous conversion. Cardioversion cancelled 04/19/2018 who presents for  follow-up of her arrhythmia, atrial fibrillation  Right foot pain, Told to decrease weight bearing  No sx from atrial fib, Rare palpitations,  Not on atenolol, amiodarone  Rate 80s    no regular exercise program  Labs from PMD 08/2019 Stable  Labs reviewed Total chol 227, LDL 117  Off atenolol and amiodarone given her bradycardia She feels the medicines made her feel foggy in the past  No edema, venous issue  On Eliquis, no recent falls Prior history of GI bleeds, none recently,  EKG personally reviewed by myself on todays visit Showed atrial fibrillation 85  bpm no significant ST or T wave changes  Other past medical history reviewed Cardioversion cancelled 04/19/2018 Was in NSR on amiodarone and atenolol on arrival to the hospital She had bradycardia and amiodarone decreased down to 100 mg daily atenolol down to 12.5 mg daily  Previously received adenosine IV push for tachycardia revealing flutter waves CHA2DS2VASc of 3 (age/age/gender), started on eliquis 5 mg BID (creat 0.8 12/4, wt > 60 kg)  Echocardiogram 12/08/2016 Ejection fraction 50-55%, moderately elevated right heart pressures 50-55 mmHg  Had a fall Nov 2017: large laceration on forehead   PMH:   has a past medical history of Anxiety, Asthma, Atrial flutter ([place]), Breast cancer ([place]), Endometrial cancer ([place]),  Fracture of right humerus (11/2008), Hypothyroidism, and Osteoporosis.  PSH:    Past Surgical History:  Procedure Laterality Date  . CARDIOVERSION N/A 04/19/2018   Procedure: CARDIOVERSION;  Surgeon: Amanda Merritts, MD;  Location: [facility];  Service: Cardiovascular;  Laterality: N/A;  . CARDIOVERSION N/A 01/18/2020   Procedure: CARDIOVERSION;  Surgeon: Amanda Merritts, MD;  Location: [facility] OR[facility];  Service: Cardiovascular;  Laterality: N/A;  . Right Mastectomy  1996  . VAGINAL HYSTERECTOMY  08/2001    Current Outpatient Medications  Medication Sig Dispense Refill  . ascorbic acid (CVS VITAMIN C) 500 MG tablet Take 500 mg by mouth daily in the afternoon. Most days    . atenolol (TENORMIN) 25 MG tablet Take 1/2 tablet (12.5 mg) once daily as needed for symptoms related to fast heart rates (Patient taking differently: Take 12.5 mg by mouth daily as needed (palpitations). ) 180 tablet 3  . beclomethasone (QVAR REDIHALER) 40 MCG/ACT inhaler Inhale 2 puffs into the lungs 2 (two) times daily. (Patient taking differently: Inhale 2 puffs into the lungs 2 (two) times daily as needed (respiratory issues). ) 1 Inhaler 5  . Calcium Carb-Cholecalciferol (CALCIUM-VITAMIN D) 600-400 MG-UNIT TABS Take 1 tablet by mouth daily at 10 pm.     . cetirizine (ZYRTEC) 10 MG tablet Take 10 mg by mouth daily at 10 pm.     . Cholecalciferol (D3 SUPER STRENGTH) 50 MCG (2000 UT) CAPS Take 2,000 Units by mouth daily in the afternoon.    . citalopram (CELEXA) 40 MG tablet Take 40 mg by mouth daily at 10  pm.     . ELIQUIS 5 MG TABS tablet Take 1 tablet by mouth twice daily (Patient taking differently: Take 5 mg by mouth 2 (two) times daily. (0800 & 2000)) 180 tablet 2  . levothyroxine (SYNTHROID, LEVOTHROID) 75 MCG tablet Take 75 mcg by mouth daily at 10 pm.     . magnesium oxide (MAG-OX) 400 MG tablet Take 400 mg by mouth daily at 10 pm.     . montelukast (SINGULAIR) 10 MG tablet Take 10 mg by mouth daily at 10 pm.     .  Multiple Vitamin (MULTIVITAMIN WITH MINERALS) TABS tablet Take 1 tablet by mouth daily.    . Flaxseed, Linseed, (FLAX SEED OIL PO) Take 1 capsule by mouth daily.     No current facility-administered medications for this visit.     Allergies:   Paxil [paroxetine hcl]   Social History:  The patient  reports that she has never smoked. She has never used smokeless tobacco. She reports current alcohol use of about 1.0 standard drinks of alcohol per week. She reports that she does not use drugs.   Family History:   family history includes Breast cancer in her sister; Hypertension in her mother; Osteoporosis in her sister; Stroke in her father and mother.    Review of Systems: Review of Systems  Constitutional: Negative.   HENT: Negative.   Respiratory: Negative.   Cardiovascular: Negative.   Gastrointestinal: Negative.   Musculoskeletal: Negative.        Gait instability  Neurological: Negative.   Psychiatric/Behavioral: Negative.   All other systems reviewed and are negative.   PHYSICAL EXAM: VS:  BP 122/70 (BP Location: Left Arm, Patient Position: Sitting, Cuff Size: Normal)   Pulse 85   Ht 5\' 1"  (1.549 m)   Wt 149 lb (67.6 kg)   SpO2 96%   BMI 28.15 kg/m  , BMI Body mass index is 28.15 kg/m.  Constitutional:  oriented to person, place, and time. No distress.  HENT:  Head: Grossly normal Eyes:  no discharge. No scleral icterus.  Neck: No JVD, no carotid bruits  Cardiovascular: irreg, irreg, no murmurs appreciated Pulmonary/Chest: Clear to auscultation bilaterally, no wheezes or rails Abdominal: Soft.  no distension.  no tenderness.  Musculoskeletal: Normal range of motion Neurological:  normal muscle tone. Coordination normal. No atrophy Skin: Skin warm and dry Psychiatric: normal affect, pleasant   Recent Labs: 01/03/2020: BUN 19; Creatinine, Ser 0.86; Hemoglobin 14.0; Platelets 272; Potassium 4.7; Sodium 136    Lipid Panel No results found for: CHOL, HDL, LDLCALC,  TRIG    Wt Readings from Last 3 Encounters:  02/01/20 149 lb (67.6 kg)  01/18/20 148 lb (67.1 kg)  01/03/20 149 lb 12 oz (67.9 kg)     ASSESSMENT AND PLAN:  Persistent atrial fibrillation History of sick sinus syndrome  in atial fib, no sx on Eliquis Atenolol 12.5 as needed for palpitations  Anxiety Managed by primary care  stable  Hypothyroidism, unspecified type On thyroid supplement, stable  Hyperlipidemia Cholesterol high 235 Does not want cholesterol medication  Uncomplicated asthma, unspecified asthma severity, unspecified whether persistent No recent issues, stable  Gait instability/risk of falls No recent falls, recommended regular walking program Foot pain, told to minimize weight bearing   Total encounter time more than 25 minutes  Greater than 50% was spent in counseling and coordination of care with the patient   Disposition:   F/U   12 months   No orders of the defined types were  placed in this encounter.    Signed, Esmond Plants, M.D., Ph.D. 02/01/2020  [place], [place]

## 2020-02-01 ENCOUNTER — Encounter: Payer: Self-pay | Admitting: Cardiovascular Disease

## 2020-02-01 ENCOUNTER — Other Ambulatory Visit: Payer: Self-pay

## 2020-02-01 ENCOUNTER — Ambulatory Visit (INDEPENDENT_AMBULATORY_CARE_PROVIDER_SITE_OTHER): Payer: Medicare Other | Admitting: Cardiovascular Disease

## 2020-02-01 VITALS — BP 122/70 | HR 85 | Ht 61.0 in | Wt 149.0 lb

## 2020-02-01 DIAGNOSIS — R001 Bradycardia, unspecified: Secondary | ICD-10-CM | POA: Diagnosis not present

## 2020-02-01 DIAGNOSIS — R0609 Other forms of dyspnea: Secondary | ICD-10-CM

## 2020-02-01 DIAGNOSIS — R0600 Dyspnea, unspecified: Secondary | ICD-10-CM | POA: Diagnosis not present

## 2020-02-01 DIAGNOSIS — M353 Polymyalgia rheumatica: Secondary | ICD-10-CM

## 2020-02-01 DIAGNOSIS — E782 Mixed hyperlipidemia: Secondary | ICD-10-CM

## 2020-02-01 DIAGNOSIS — I480 Paroxysmal atrial fibrillation: Secondary | ICD-10-CM

## 2020-02-01 DIAGNOSIS — I4892 Unspecified atrial flutter: Secondary | ICD-10-CM

## 2020-02-01 NOTE — Patient Instructions (Signed)

## 2020-04-03 ENCOUNTER — Encounter: Payer: Self-pay | Admitting: Internal Medicine

## 2020-04-03 ENCOUNTER — Ambulatory Visit (INDEPENDENT_AMBULATORY_CARE_PROVIDER_SITE_OTHER): Payer: Medicare Other | Admitting: Internal Medicine

## 2020-04-03 ENCOUNTER — Other Ambulatory Visit: Payer: Self-pay

## 2020-04-03 VITALS — BP 128/66 | HR 74 | Ht 60.0 in | Wt 149.1 lb

## 2020-04-03 DIAGNOSIS — I4819 Other persistent atrial fibrillation: Secondary | ICD-10-CM | POA: Diagnosis not present

## 2020-04-03 DIAGNOSIS — R001 Bradycardia, unspecified: Secondary | ICD-10-CM

## 2020-04-03 NOTE — Patient Instructions (Signed)
Medication Instructions:  °- Your physician recommends that you continue on your current medications as directed. Please refer to the Current Medication list given to you today. ° °*If you need a refill on your cardiac medications before your next appointment, please call your pharmacy* ° ° °Lab Work: °- none ordered ° °If you have labs (blood work) drawn today and your tests are completely normal, you will receive your results only by: °• MyChart Message (if you have MyChart) OR °• A paper copy in the mail °If you have any lab test that is abnormal or we need to change your treatment, we will call you to review the results. ° ° °Testing/Procedures: °- none ordered ° ° °Follow-Up: °At [facility], you and your health needs are our priority.  As part of our continuing mission to provide you with exceptional heart care, we have created designated Provider Care Teams.  These Care Teams include your primary Cardiologist (physician) and Advanced Practice Providers (APPs -  Physician Assistants and Nurse Practitioners) who all work together to provide you with the care you need, when you need it. ° °We recommend signing up for the patient portal called "MyChart".  Sign up information is provided on this After Visit Summary.  MyChart is used to connect with patients for Virtual Visits (Telemedicine).  Patients are able to view lab/test results, encounter notes, upcoming appointments, etc.  Non-urgent messages can be sent to your provider as well.   °To learn more about what you can do with MyChart, go to [email or web address].   ° °Your next appointment:   °4 month(s) ° °The format for your next appointment:   °In Person ° °Provider:   °Steven Klein, MD ° ° °Other Instructions °n/a ° °

## 2020-04-03 NOTE — Progress Notes (Signed)
Patient Care Team: [place] Pink, MD as PCP - General (Family Medicine)   HPI  Amanda Dalton is a 86 y.o. female Seen because of recurrent palps with HR >110-120s; indeed, not really palps, but she has been using home monitor and has noted the increased HR, mostly without accompanying symptoms--  Previously treated with atenolol and amiodarone and then discontinued -- bradycardia   Atrial fibrillation 5/19 presented with spontaneous conversion.   9/19 Personally reviewed  Monitoring>>paroxysms of atrial fib, some sinus brady  She saw Dr. Lake Bells 1/19 for abnormal PFTs with outflow obstruction.  Thought to be chronic asthma.  on inhalers     Underwent cardioversion 2/21; 3/21 she was out of rhythm.  She is in rhythm today.  It is her impression that she can tell the difference between sinus and A. fib and that she feels better with less shortness of breath and more energy when she is in sinus.  Some peripheral edema no nocturnal dyspnea or orthopnea.  No chest pain.  Has need for some major dental surgery   Date Cr Hgb TSH  5/19 0.83 12.4   12/19  0.8    3/20 0.8 13.9 2.28  10/20 0.8    2/21 0.86 14       Thromboembolic risk factors ( age -48, Gender-1 ) for a CHADSVASc Score of 3   . DATE TEST EF   1/18 Echo   50-55 % LAE mild PA 50-31mm          Records and Results Reviewed   Past Medical History:  Diagnosis Date  . Anxiety   . Asthma   . Atrial flutter ([place])    a. Dx 11/17/2016-->CHA2DS2VASc = 3-->Eliquis 5 mg BID.  Marland Kitchen Breast cancer ([place])    a. 1996 s/p R modified radical mastectomy-->chemo w/ tamoxifen.  . Endometrial cancer ([place])    a. 08/2001 s/p hysterectomy. No node involvement.  . Fracture of right humerus 11/2008  . Hypothyroidism   . Osteoporosis     Past Surgical History:  Procedure Laterality Date  . CARDIOVERSION N/A 04/19/2018   Procedure: CARDIOVERSION;  Surgeon: Minna Merritts, MD;  Location: [facility];  Service:  Cardiovascular;  Laterality: N/A;  . CARDIOVERSION N/A 01/18/2020   Procedure: CARDIOVERSION;  Surgeon: Minna Merritts, MD;  Location: [facility];  Service: Cardiovascular;  Laterality: N/A;  . Right Mastectomy  1996  . VAGINAL HYSTERECTOMY  08/2001    Current Meds  Medication Sig  . ascorbic acid (CVS VITAMIN C) 500 MG tablet Take 500 mg by mouth daily in the afternoon. Most days  . atenolol (TENORMIN) 25 MG tablet Take 1/2 tablet (12.5 mg) once daily as needed for symptoms related to fast heart rates (Patient taking differently: Take 12.5 mg by mouth daily as needed (palpitations). )  . Calcium Carb-Cholecalciferol (CALCIUM-VITAMIN D) 600-400 MG-UNIT TABS Take 1 tablet by mouth daily at 10 pm.   . cetirizine (ZYRTEC) 10 MG tablet Take 10 mg by mouth daily at 10 pm.   . Cholecalciferol (D3 SUPER STRENGTH) 50 MCG (2000 UT) CAPS Take 2,000 Units by mouth daily in the afternoon.  . citalopram (CELEXA) 40 MG tablet Take 40 mg by mouth daily at 10 pm.   . ELIQUIS 5 MG TABS tablet Take 1 tablet by mouth twice daily (Patient taking differently: Take 5 mg by mouth 2 (two) times daily. (0800 & 2000))  . Flaxseed, Linseed, (FLAX SEED OIL PO) Take 1 capsule by mouth  daily.  . levothyroxine (SYNTHROID, LEVOTHROID) 75 MCG tablet Take 75 mcg by mouth daily at 10 pm.   . magnesium oxide (MAG-OX) 400 MG tablet Take 400 mg by mouth daily at 10 pm.   . montelukast (SINGULAIR) 10 MG tablet Take 10 mg by mouth daily at 10 pm.   . Multiple Vitamin (MULTIVITAMIN WITH MINERALS) TABS tablet Take 1 tablet by mouth daily.    Allergies  Allergen Reactions  . Paxil [Paroxetine Hcl] Nausea Only      Review of Systems negative except from HPI and PMH  Physical Exam BP 128/66 (BP Location: Left Arm, Patient Position: Sitting, Cuff Size: Normal)   Pulse 74   Ht 5' (1.524 m)   Wt 149 lb 2 oz (67.6 kg)   SpO2 98%   BMI 29.12 kg/m  Well developed and well nourished in no acute distress HENT normal Neck  supple with JVP-flat Clear Regular rate and rhythm, no  murmur Abd-soft with active BS No Clubbing cyanosis tr-1+ edema Skin-warm and dry A & Oriented  Grossly normal sensory and motor function  ECG sinus rhythm at 74 L 17/08/43 Occasional PACs  Assessment and  Plan  Atrial fibrillation-persistent  Bradycardia-sinus  Dyspnea on exertion  COPD  -Intermittent atrial fibrillation.  Currently in sinus rhythm.  Her impression is that she may be better off in sinus.  We discussed the role of antiarrhythmic therapy to try to sustain sinus rhythm.  She is not sure that she wants to take more medicine at this time.  She will continue to try to associate her symptoms with the presence or absence of atrial fibrillation and decide whether more medication is worth it to her.  Mild peripheral edema.  Encourage decrease sodium intake.  Not inclined towards intermittent diuretic.  On Anticoagulation;  No bleeding issues     We spent more than 50% of our >30  min visit in face to face counseling regarding the above      Current medicines are reviewed at length with the patient today .  The patient does not  have concerns regarding medicines.

## 2020-04-04 ENCOUNTER — Telehealth: Payer: Self-pay | Admitting: Internal Medicine

## 2020-04-04 NOTE — Telephone Encounter (Signed)
Patient calling to ask Dr. Caryl Comes to prescribe medication that was discussed in yesterdays appointment. Patient states the medication is used to keep heart in rhythm. Patient uses  [facility] on [address] as her pharmacy

## 2020-04-04 NOTE — Telephone Encounter (Signed)
To Dr. Klein to review. 

## 2020-04-08 NOTE — Telephone Encounter (Signed)
I spoke with Dr. Caryl Comes regarding the patient's willingness to try an antiarrhythmic drug. Per Dr. Caryl Comes, the recommendation would be: 1) Start Flecainide 50 mg BID 2) Increase atenolol to 25 mg QD 3) follow up in 1 month.  I have notified the patient of Dr. Olin Pia recommendations.  Per the patient she "I am rethinking all of this now."  She is nervous about taking the atenolol QD instead of PRN. She states she is also having some mild swelling to her lower extremities, and may need a fluid pill. I advised she should watch her sodium intake first as she is not eating a low sodium diet in particular.  She did have canned soup for lunch.  The patient states she will "rethink" all of this and call back if she decides to proceed with the antiarrhythmic.  I advised that it is fine for her to think about this further and call back if needed.   The patient was appreciative for the call back.

## 2020-07-02 ENCOUNTER — Other Ambulatory Visit: Payer: Self-pay | Admitting: Internal Medicine

## 2020-07-02 NOTE — Telephone Encounter (Signed)
This is a [place] pt 

## 2020-07-02 NOTE — Telephone Encounter (Signed)
Pt's age 86, wt 67.6 kg, SCr 0.86, CrCl 49.18, last ov w/ SK 04/03/20.

## 2020-08-01 ENCOUNTER — Other Ambulatory Visit: Payer: Self-pay | Admitting: Family Medicine

## 2020-08-01 DIAGNOSIS — K439 Ventral hernia without obstruction or gangrene: Secondary | ICD-10-CM

## 2020-08-01 DIAGNOSIS — R109 Unspecified abdominal pain: Secondary | ICD-10-CM

## 2020-08-13 ENCOUNTER — Ambulatory Visit
Admission: RE | Admit: 2020-08-13 | Discharge: 2020-08-13 | Disposition: A | Discharge status: Home / Self Care | Payer: Medicare Other | Source: Ambulatory Visit | Attending: Family Medicine | Admitting: Family Medicine

## 2020-08-13 ENCOUNTER — Other Ambulatory Visit: Payer: Self-pay

## 2020-08-13 DIAGNOSIS — K439 Ventral hernia without obstruction or gangrene: Secondary | ICD-10-CM | POA: Diagnosis present

## 2020-08-13 DIAGNOSIS — R109 Unspecified abdominal pain: Secondary | ICD-10-CM | POA: Diagnosis present

## 2020-08-21 ENCOUNTER — Ambulatory Visit (INDEPENDENT_AMBULATORY_CARE_PROVIDER_SITE_OTHER): Payer: Medicare Other | Admitting: Internal Medicine

## 2020-08-21 ENCOUNTER — Encounter: Payer: Self-pay | Admitting: Internal Medicine

## 2020-08-21 ENCOUNTER — Other Ambulatory Visit: Payer: Self-pay

## 2020-08-21 VITALS — BP 124/80 | HR 68 | Ht 60.0 in | Wt 150.0 lb

## 2020-08-21 DIAGNOSIS — R001 Bradycardia, unspecified: Secondary | ICD-10-CM | POA: Diagnosis not present

## 2020-08-21 DIAGNOSIS — I4819 Other persistent atrial fibrillation: Secondary | ICD-10-CM | POA: Diagnosis not present

## 2020-08-21 NOTE — Progress Notes (Signed)
Patient Care Team: Amanda Pink, MD as PCP - General (Family Medicine)   HPI  Amanda Dalton is a 86 y.o. female Seen because of recurrent palps with HR >110-120s; indeed, not really palps, but she has been using home monitor and has noted the increased HR, mostly without accompanying symptoms--  Previously treated with atenolol and amiodarone and then discontinued -- bradycardia   Atrial fibrillation 5/19 presented with spontaneous conversion.   9/19 Personally reviewed  Monitoring>>paroxysms of atrial fib, some sinus brady  She saw Dr. Lake Bells 1/19 for abnormal PFTs with outflow obstruction.  Thought to be chronic asthma.  on inhalers     Underwent cardioversion 2/21; 3/21 she was out of rhythm.  She is in rhythm today.  It is her impression that she can tell the difference between sinus and A. fib and that she feels better with less shortness of breath and more energy when she is in sinus. Some edema, but no sob or chest pain Fell at [facility] last week after trying to open a trashcan with foot pedal   Has need for some major dental surgery   Date Cr Hgb TSH  5/19 0.83 12.4   12/19  0.8    3/20 0.8 13.9 2.28  10/20 0.8    2/21 0.86 14   9/21 0.83 12.7       Thromboembolic risk factors ( age -69, Gender-1 ) for a CHADSVASc Score of 3   . DATE TEST EF   1/18 Echo   50-55 % LAE mild PA 50-34mm          Records and Results Reviewed   Past Medical History:  Diagnosis Date  . Anxiety   . Asthma   . Atrial flutter ([place])    a. Dx 11/17/2016-->CHA2DS2VASc = 3-->Eliquis 5 mg BID.  Marland Kitchen Breast cancer ([place])    a. 1996 s/p R modified radical mastectomy-->chemo w/ tamoxifen.  . Endometrial cancer ([place])    a. 08/2001 s/p hysterectomy. No node involvement.  . Fracture of right humerus 11/2008  . Hypothyroidism   . Osteoporosis     Past Surgical History:  Procedure Laterality Date  . CARDIOVERSION N/A 04/19/2018   Procedure: CARDIOVERSION;  Surgeon:  Minna Merritts, MD;  Location: [facility];  Service: Cardiovascular;  Laterality: N/A;  . CARDIOVERSION N/A 01/18/2020   Procedure: CARDIOVERSION;  Surgeon: Minna Merritts, MD;  Location: [facility];  Service: Cardiovascular;  Laterality: N/A;  . Right Mastectomy  1996  . VAGINAL HYSTERECTOMY  08/2001    Current Meds  Medication Sig  . albuterol (VENTOLIN HFA) 108 (90 Base) MCG/ACT inhaler Inhale 1 puff into the lungs every 4 (four) hours as needed.   Marland Kitchen apixaban (ELIQUIS) 5 MG TABS tablet Take 1 tablet (5 mg total) by mouth 2 (two) times daily. (0800 & 2000)  . ascorbic acid (CVS VITAMIN C) 500 MG tablet Take 500 mg by mouth daily in the afternoon. Most days  . atenolol (TENORMIN) 25 MG tablet Take 12.5 mg by mouth daily as needed.  . beclomethasone (QVAR) 40 MCG/ACT inhaler Inhale 2 puffs into the lungs 2 (two) times daily as needed.  . Calcium Carb-Cholecalciferol (CALCIUM-VITAMIN D) 600-400 MG-UNIT TABS Take 1 tablet by mouth daily at 10 pm.   . cetirizine (ZYRTEC) 10 MG tablet Take 10 mg by mouth daily at 10 pm.   . Cholecalciferol (D3 SUPER STRENGTH) 50 MCG (2000 UT) CAPS Take 2,000 Units by mouth daily in the afternoon.  Marland Kitchen  citalopram (CELEXA) 40 MG tablet Take 40 mg by mouth daily at 10 pm.   . levothyroxine (SYNTHROID, LEVOTHROID) 75 MCG tablet Take 75 mcg by mouth daily at 10 pm.   . magnesium oxide (MAG-OX) 400 MG tablet Take 400 mg by mouth daily at 10 pm.   . montelukast (SINGULAIR) 10 MG tablet Take 10 mg by mouth daily at 10 pm.   . Multiple Vitamin (MULTIVITAMIN WITH MINERALS) TABS tablet Take 1 tablet by mouth daily.    Allergies  Allergen Reactions  . Paxil [Paroxetine Hcl] Nausea Only      Review of Systems negative except from HPI and PMH  Physical Exam BP 124/80 (BP Location: Left Arm, Patient Position: Sitting, Cuff Size: Normal)   Pulse 68   Ht 5' (1.524 m)   Wt 150 lb (68 kg)   SpO2 97%   BMI 29.29 kg/m  Well developed and nourished in no acute  distress HENT normal Neck supple with JVP-  Fla  Clear Regular rate and rhythm, no murmurs or gallops Abd-soft with active BS No Clubbing cyanosis edema Skin-warm and dry A & Oriented  Grossly normal sensory and motor function  ECG sinus at 68 with frequent PACs in a pattern of bigeminy   Assessment and  Plan  Atrial fibrillation-persistent  Bradycardia-sinus  Dyspnea on exertion  COPD  Fall  No interval atrial fibrillation of which she is aware.  No clinical bleeding.  Functional status is stable.  Sinus rates are acceptable.  Recent fall, sounds mechanical.        Current medicines are reviewed at length with the patient today .  The patient does not  have concerns regarding medicines.

## 2020-08-21 NOTE — Patient Instructions (Signed)

## 2020-11-05 ENCOUNTER — Other Ambulatory Visit: Payer: Self-pay

## 2020-11-05 ENCOUNTER — Encounter: Payer: Self-pay | Admitting: Dermatology

## 2020-11-05 ENCOUNTER — Ambulatory Visit (INDEPENDENT_AMBULATORY_CARE_PROVIDER_SITE_OTHER): Payer: Medicare Other | Admitting: Dermatology

## 2020-11-05 DIAGNOSIS — D229 Melanocytic nevi, unspecified: Secondary | ICD-10-CM

## 2020-11-05 DIAGNOSIS — Z1283 Encounter for screening for malignant neoplasm of skin: Secondary | ICD-10-CM | POA: Diagnosis not present

## 2020-11-05 DIAGNOSIS — L814 Other melanin hyperpigmentation: Secondary | ICD-10-CM

## 2020-11-05 DIAGNOSIS — Z85828 Personal history of other malignant neoplasm of skin: Secondary | ICD-10-CM | POA: Diagnosis not present

## 2020-11-05 DIAGNOSIS — L821 Other seborrheic keratosis: Secondary | ICD-10-CM

## 2020-11-05 DIAGNOSIS — Z853 Personal history of malignant neoplasm of breast: Secondary | ICD-10-CM

## 2020-11-05 DIAGNOSIS — I872 Venous insufficiency (chronic) (peripheral): Secondary | ICD-10-CM | POA: Diagnosis not present

## 2020-11-05 DIAGNOSIS — D1800 Hemangioma unspecified site: Secondary | ICD-10-CM

## 2020-11-05 DIAGNOSIS — L578 Other skin changes due to chronic exposure to nonionizing radiation: Secondary | ICD-10-CM

## 2020-11-05 NOTE — Progress Notes (Signed)
   Follow-Up Visit   Subjective  Amanda Dalton is a 86 y.o. female who presents for the following: Annual Exam (Mole check, hx of skin cancer ). The patient presents for Total-Body Skin Exam (TBSE) for skin cancer screening and mole check. Hx of breast cancer on the R breast in 1996   The following portions of the chart were reviewed this encounter and updated as appropriate:   Tobacco  Allergies  Meds  Problems  Med Hx  Surg Hx  Fam Hx     Review of Systems:  No other skin or systemic complaints except as noted in HPI or Assessment and Plan.  Objective  Well appearing patient in no apparent distress; mood and affect are within normal limits.  A full examination was performed including scalp, head, eyes, ears, nose, lips, neck, chest, axillae, abdomen, back, buttocks, bilateral upper extremities, bilateral lower extremities, hands, feet, fingers, toes, fingernails, and toenails. All findings within normal limits unless otherwise noted below.  Objective  Left superior side: Well healed scar with no evidence of recurrence, no lymphadenopathy.   Objective  lower legs: Erythematous, scaly patches involving the ankle and distal lower leg with associated lower leg edema.   Objective  Right Breast: Clear. Observe for recurrence. Well healed scar with no evidence of recurrence, no lymphadenopathy.    Assessment & Plan  History of SCC (squamous cell carcinoma) of skin Left superior side Clear. Observe for recurrence. Call clinic for new or changing lesions.  Recommend regular skin exams, daily broad-spectrum spf 30+ sunscreen use, and photoprotection.     Venous stasis dermatitis of left lower extremity lower legs Graduated compression stockings  Hx of breast cancer Right Breast no evidence of recurrence, no lymphadenopathy.    Lentigines - Scattered tan macules - Discussed due to sun exposure - Benign, observe - Call for any changes  Seborrheic Keratoses - Stuck-on,  waxy, tan-brown papules and plaques  - Discussed benign etiology and prognosis. - Observe - Call for any changes  Melanocytic Nevi - Tan-brown and/or pink-flesh-colored symmetric macules and papules - Benign appearing on exam today - Observation - Call clinic for new or changing moles - Recommend daily use of broad spectrum spf 30+ sunscreen to sun-exposed areas.   Hemangiomas - Red papules - Discussed benign nature - Observe - Call for any changes  Actinic Damage - Chronic, secondary to cumulative UV/sun exposure - diffuse scaly erythematous macules with underlying dyspigmentation - Recommend daily broad spectrum sunscreen SPF 30+ to sun-exposed areas, reapply every 2 hours as needed.  - Call for new or changing lesions.  Skin cancer screening performed today.  Return in about 1 year (around 11/05/2021) for TBSE .   IMarye Round, CMA, am acting as scribe for Sarina Ser, MD .  Documentation: I have reviewed the above documentation for accuracy and completeness, and I agree with the above.  Sarina Ser, MD

## 2020-11-11 ENCOUNTER — Encounter: Payer: Self-pay | Admitting: Dermatology

## 2021-01-01 ENCOUNTER — Other Ambulatory Visit: Payer: Self-pay | Admitting: Internal Medicine

## 2021-01-02 NOTE — Telephone Encounter (Signed)
Pt's age 87, wt 28 kg, SCr 0.8, CrCl 53.18, last ov w/ SK 08/21/20.

## 2021-01-13 ENCOUNTER — Other Ambulatory Visit: Payer: Self-pay | Admitting: Gastroenterology

## 2021-01-13 DIAGNOSIS — R1013 Epigastric pain: Secondary | ICD-10-CM

## 2021-01-27 ENCOUNTER — Other Ambulatory Visit: Payer: Self-pay

## 2021-01-27 ENCOUNTER — Ambulatory Visit
Admission: RE | Admit: 2021-01-27 | Discharge: 2021-01-27 | Disposition: A | Discharge status: Home / Self Care | Payer: Medicare Other | Source: Ambulatory Visit | Attending: Gastroenterology | Admitting: Gastroenterology

## 2021-01-27 DIAGNOSIS — R1013 Epigastric pain: Secondary | ICD-10-CM | POA: Insufficient documentation

## 2021-01-27 MED ORDER — IOHEXOL 300 MG/ML  SOLN
100.0000 mL | Freq: Once | INTRAMUSCULAR | Status: AC | PRN
  Administered 2021-01-27: 100 mL via INTRAVENOUS

## 2021-02-01 NOTE — Progress Notes (Addendum)
Cardiology Office Note  Date:  02/02/2021   ID:  Amanda Dalton, DOB 06-21-1931, MRN [id number]  PCP:  Maryland Pink, MD   Chief Complaint  Patient presents with  . Other    12 month f/u no complaints today. Meds reviewed verbally with pt.    HPI:  87 y/o ? with a h/o  hypothyroidism,  anxiety,  asthma,  atrial flutter rate 120 bpm, started on anticoagulation,  previous episode of atrial flutter presented in the setting of attending a party 10/2016: fib flutter 11/2016: NSR Atrial fibrillation 5/19 again with spontaneous conversion. Cardioversion cancelled 04/19/2018 cardioversion 2/21; 3/21  who presents for  follow-up of her arrhythmia, atrial fibrillation  LOV 01/2020  In atrial fibrillation today, no sx, "did not know" Rare palpitations Does not take atenolol Off atenolol and amiodarone given  bradycardia  Seen by Dr. Caryl Comes 07/2020: NSR per the notes Having pelvic pain  Had CT scan, results reviewed Large hiatal hernia. Ventral wall hernia in the upper abdomen containing portions of the small bowel without evidence for an obstruction. Rectosigmoid diverticulosis without acute inflammation. Kidney cyst  She is concerned about aortic atherosclerosis seen on CT scan, Does not want cholesterol pill  Trace ankle swelling on left  no regular exercise program  Labs reviewed Total chol 227, LDL 117  On Eliquis, one fall, mechanical  Prior history of GI bleeds, none recently,  EKG personally reviewed by myself on todays visit Showed atrial fibrillation 90  bpm no significant ST or T wave changes  Other past medical history reviewed Cardioversion cancelled 04/19/2018 Was in NSR on amiodarone and atenolol on arrival to the hospital She had bradycardia and amiodarone decreased down to 100 mg daily atenolol down to 12.5 mg daily  Previously received adenosine IV push for tachycardia revealing flutter waves CHA2DS2VASc of 3 (age/age/gender), started on eliquis 5 mg BID  (creat 0.8 12/4, wt > 60 kg)  Echocardiogram 12/08/2016 Ejection fraction 50-55%, moderately elevated right heart pressures 50-55 mmHg  Had a fall Nov 2017: large laceration on forehead   PMH:   has a past medical history of Anxiety, Asthma, Atrial flutter ([place]), Breast cancer ([place]), Endometrial cancer ([place]), Fracture of right humerus (11/2008), Hypothyroidism, Osteoporosis, and Squamous cell carcinoma of skin (01/29/2010).  PSH:    Past Surgical History:  Procedure Laterality Date  . CARDIOVERSION N/A 04/19/2018   Procedure: CARDIOVERSION;  Surgeon: Minna Merritts, MD;  Location: [facility] ORS;  Service: Cardiovascular;  Laterality: N/A;  . CARDIOVERSION N/A 01/18/2020   Procedure: CARDIOVERSION;  Surgeon: Minna Merritts, MD;  Location: [facility] ORS;  Service: Cardiovascular;  Laterality: N/A;  . Right Mastectomy  1996  . VAGINAL HYSTERECTOMY  08/2001    Current Outpatient Medications  Medication Sig Dispense Refill  . albuterol (VENTOLIN HFA) 108 (90 Base) MCG/ACT inhaler Inhale 1 puff into the lungs every 4 (four) hours as needed.     Marland Kitchen ascorbic acid (VITAMIN C) 500 MG tablet Take 500 mg by mouth daily in the afternoon. Most days    . atenolol (TENORMIN) 25 MG tablet Take 12.5 mg by mouth daily as needed.    . Calcium Carb-Cholecalciferol (CALCIUM-VITAMIN D) 600-400 MG-UNIT TABS Take 1 tablet by mouth daily at 10 pm.     . cetirizine (ZYRTEC) 10 MG tablet Take 10 mg by mouth daily at 10 pm.     . Cholecalciferol (D3 SUPER STRENGTH) 50 MCG (2000 UT) CAPS Take 2,000 Units by mouth daily in the afternoon.    Marland Kitchen  citalopram (CELEXA) 40 MG tablet Take 40 mg by mouth daily at 10 pm.     . ELIQUIS 5 MG TABS tablet TAKE 1 TABLET BY MOUTH TWICE DAILY (8AM  AND  8PM) 180 tablet 0  . Flaxseed, Linseed, (FLAX SEED OIL PO) Take 1 capsule by mouth daily.    Marland Kitchen levothyroxine (SYNTHROID, LEVOTHROID) 75 MCG tablet Take 75 mcg by mouth daily at 10 pm.     . magnesium oxide (MAG-OX) 400 MG tablet Take 400  mg by mouth daily at 10 pm.     . montelukast (SINGULAIR) 10 MG tablet Take 10 mg by mouth daily at 10 pm.     . Multiple Vitamin (MULTIVITAMIN WITH MINERALS) TABS tablet Take 1 tablet by mouth daily.     No current facility-administered medications for this visit.     Allergies:   Paxil [paroxetine hcl]   Social History:  The patient  reports that she has never smoked. She has never used smokeless tobacco. She reports current alcohol use of about 1.0 standard drink of alcohol per week. She reports that she does not use drugs.   Family History:   family history includes Breast cancer in her sister; Hypertension in her mother; Osteoporosis in her sister; Stroke in her father and mother.    Review of Systems: Review of Systems  Constitutional: Negative.   HENT: Negative.   Respiratory: Negative.   Cardiovascular: Negative.   Gastrointestinal: Negative.   Musculoskeletal: Negative.        Gait instability  Neurological: Negative.   Psychiatric/Behavioral: Negative.   All other systems reviewed and are negative.   PHYSICAL EXAM: VS:  BP 138/90 (BP Location: Left Arm, Patient Position: Sitting, Cuff Size: Normal)   Pulse 86   Ht 5\' 1"  (1.549 m)   Wt 145 lb (65.8 kg)   SpO2 98%   BMI 27.40 kg/m  , BMI Body mass index is 27.4 kg/m.  Constitutional:  oriented to person, place, and time. No distress.  HENT:  Head: Grossly normal Eyes:  no discharge. No scleral icterus.  Neck: No JVD, no carotid bruits  Cardiovascular: Regular rate and rhythm, no murmurs appreciated Pulmonary/Chest: Clear to auscultation bilaterally, no wheezes or rails Abdominal: Soft.  no distension.  no tenderness.  Musculoskeletal: Normal range of motion Neurological:  normal muscle tone. Coordination normal. No atrophy Skin: Skin warm and dry Psychiatric: normal affect, pleasant  Recent Labs: No results found for requested labs within last 8760 hours.    Lipid Panel No results found for: CHOL, HDL,  LDLCALC, TRIG    Wt Readings from Last 3 Encounters:  02/02/21 145 lb (65.8 kg)  08/21/20 150 lb (68 kg)  04/03/20 149 lb 2 oz (67.6 kg)     ASSESSMENT AND PLAN:  Persistent atrial fibrillation History of sick sinus syndrome  in atial fib, did not know she was in atrial fibrillation today, most of her EKGs 2021 were normal sinus rhythm As she is asymptomatic, no aggressive attempt to restore normal sinus rhythm, recommend she take her atenolol for heart rate greater than 80 on Eliquis 5 BID Lasix PRN for unilateral ankle swelling, take sparingly  Anxiety Managed by primary care  stable  Hypothyroidism, unspecified type On thyroid supplement, stable  Hyperlipidemia Cholesterol 210 Does not want cholesterol medication  Uncomplicated asthma, unspecified asthma severity, unspecified whether persistent No recent issues, stable  Gait instability/risk of falls No recent falls, recommended regular walking program Foot pain, told to minimize weight bearing  Total encounter time more than 25 minutes  Greater than 50% was spent in counseling and coordination of care with the patient    No orders of the defined types were placed in this encounter.    Signed, Esmond Plants, M.D., Ph.D. 02/02/2021  [place], [place]

## 2021-02-02 ENCOUNTER — Encounter: Payer: Self-pay | Admitting: Cardiovascular Disease

## 2021-02-02 ENCOUNTER — Other Ambulatory Visit: Payer: Self-pay

## 2021-02-02 ENCOUNTER — Ambulatory Visit (INDEPENDENT_AMBULATORY_CARE_PROVIDER_SITE_OTHER): Payer: Medicare Other | Admitting: Cardiovascular Disease

## 2021-02-02 VITALS — BP 138/90 | HR 86 | Ht 61.0 in | Wt 145.0 lb

## 2021-02-02 DIAGNOSIS — R0600 Dyspnea, unspecified: Secondary | ICD-10-CM

## 2021-02-02 DIAGNOSIS — E782 Mixed hyperlipidemia: Secondary | ICD-10-CM

## 2021-02-02 DIAGNOSIS — R0609 Other forms of dyspnea: Secondary | ICD-10-CM

## 2021-02-02 DIAGNOSIS — I4892 Unspecified atrial flutter: Secondary | ICD-10-CM

## 2021-02-02 DIAGNOSIS — M353 Polymyalgia rheumatica: Secondary | ICD-10-CM

## 2021-02-02 DIAGNOSIS — R001 Bradycardia, unspecified: Secondary | ICD-10-CM

## 2021-02-02 DIAGNOSIS — I4819 Other persistent atrial fibrillation: Secondary | ICD-10-CM | POA: Diagnosis not present

## 2021-02-02 MED ORDER — FUROSEMIDE 20 MG PO TABS: 20.0000 mg | ORAL_TABLET | Freq: Every day | ORAL | 1 refills | Status: DC | PRN

## 2021-02-02 NOTE — Patient Instructions (Addendum)
Medication Instructions:  Lasix 20 mg daily as needed (no more than 2x a week)  Atenolol, on your list Take it for elevated heart rate , pulse >80   If you need a refill on your cardiac medications before your next appointment, please call your pharmacy.    Lab work: No new labs needed   If you have labs (blood work) drawn today and your tests are completely normal, you will receive your results only by: Marland Kitchen MyChart Message (if you have MyChart) OR . A paper copy in the mail If you have any lab test that is abnormal or we need to change your treatment, we will call you to review the results.   Testing/Procedures: No new testing needed   Follow-Up: At [facility], you and your health needs are our priority.  As part of our continuing mission to provide you with exceptional heart care, we have created designated Provider Care Teams.  These Care Teams include your primary Cardiologist (physician) and Advanced Practice Providers (APPs -  Physician Assistants and Nurse Practitioners) who all work together to provide you with the care you need, when you need it.  . You will need a follow up appointment in 6 months  . Providers on your designated Care Team:   . Murray Hodgkins, NP . Christell Faith, PA-C . Marrianne Mood, PA-C  Any Other Special Instructions Will Be Listed Below (If Applicable).  COVID-19 Vaccine Information can be found at: ShippingScam.co.uk For questions related to vaccine distribution or appointments, please email vaccine@[email or web address] .com or call [phone].

## 2021-02-27 ENCOUNTER — Other Ambulatory Visit: Payer: Self-pay | Admitting: Gastroenterology

## 2021-02-27 DIAGNOSIS — R9389 Abnormal findings on diagnostic imaging of other specified body structures: Secondary | ICD-10-CM

## 2021-04-01 ENCOUNTER — Other Ambulatory Visit: Payer: Self-pay

## 2021-04-01 ENCOUNTER — Ambulatory Visit
Admission: RE | Admit: 2021-04-01 | Discharge: 2021-04-01 | Disposition: A | Discharge status: Home / Self Care | Payer: Medicare Other | Source: Ambulatory Visit | Attending: Gastroenterology | Admitting: Gastroenterology

## 2021-04-01 DIAGNOSIS — R9389 Abnormal findings on diagnostic imaging of other specified body structures: Secondary | ICD-10-CM | POA: Insufficient documentation

## 2021-04-02 ENCOUNTER — Other Ambulatory Visit: Payer: Self-pay | Admitting: Internal Medicine

## 2021-04-02 NOTE — Telephone Encounter (Signed)
This is a [place] pt 

## 2021-04-02 NOTE — Telephone Encounter (Signed)
49f, 65.8kg, scr 0.9 02/18/21, lovw/gollan 02/02/21

## 2021-08-04 NOTE — Progress Notes (Signed)
Cardiology Office Note  Date:  08/05/2021   ID:  Amanda Dalton, DOB October 09, 1931, MRN [id number]  PCP:  Maryland Pink, MD   Chief Complaint  Patient presents with   6 month follow up     Patient c/o fluttering in chest at times. Medications reviewed by the patient verbally.     HPI:  87 y/o ? with a h/o  hypothyroidism,  anxiety,  asthma,  atrial flutter rate 120 bpm, started on anticoagulation,  previous episode of atrial flutter presented in the setting of attending a party 10/2016: fib flutter 11/2016: NSR Atrial fibrillation 5/19 again with spontaneous conversion. Cardioversion cancelled 04/19/2018 cardioversion 2/21; 3/21  who presents for  follow-up of her arrhythmia, atrial fibrillation  LOV 01/2021  Has a hernia/ventral, struggling with how to manage this Does not know whether to do surgery Periodic N/V, ABD bloating  In normal sinus rhythm, prior office visit was in atrial fibrillation Has rare palpitations, otherwise asymptomatic  Last clinic visit 01/2021, was in atrial fibrillation,  Does not take atenolol, amiodarone given  bradycardia  Prior imaging reviewed with her in detail, CT scan, 01/2021 Large hiatal hernia. Ventral wall hernia in the upper abdomen containing portions of the small bowel without evidence for an obstruction. Rectosigmoid diverticulosis without acute inflammation. Kidney cyst aortic atherosclerosis   Does not want cholesterol pill  Labs reviewed Total chol 227, LDL 117  Hx one fall, mechanical  Prior history of GI bleeds, none recently,  EKG personally reviewed by myself on todays visit Showed atrial fibrillation 60  bpm no significant ST or T wave changes, PACs  Other past medical history reviewed Cardioversion cancelled 04/19/2018 Was in NSR on amiodarone and atenolol on arrival to the hospital She had bradycardia and amiodarone decreased down to 100 mg daily atenolol down to 12.5 mg daily  Previously received adenosine IV push  for tachycardia revealing flutter waves CHA2DS2VASc of 3 (age/age/gender), started on eliquis 5 mg BID (creat 0.8 12/4, wt > 60 kg)  Echocardiogram 12/08/2016 Ejection fraction 50-55%, moderately elevated right heart pressures 50-55 mmHg  Had a fall Nov 2017: large laceration on forehead   PMH:   has a past medical history of Anxiety, Asthma, Atrial flutter ([place]), Breast cancer ([place]), Endometrial cancer ([place]), Fracture of right humerus (11/2008), Hypothyroidism, Osteoporosis, and Squamous cell carcinoma of skin (01/29/2010).  PSH:    Past Surgical History:  Procedure Laterality Date   CARDIOVERSION N/A 04/19/2018   Procedure: CARDIOVERSION;  Surgeon: Minna Merritts, MD;  Location: [facility] ORS;  Service: Cardiovascular;  Laterality: N/A;   CARDIOVERSION N/A 01/18/2020   Procedure: CARDIOVERSION;  Surgeon: Minna Merritts, MD;  Location: [facility] ORS;  Service: Cardiovascular;  Laterality: N/A;   Right Mastectomy  1996   VAGINAL HYSTERECTOMY  08/2001    Current Outpatient Medications  Medication Sig Dispense Refill   albuterol (VENTOLIN HFA) 108 (90 Base) MCG/ACT inhaler Inhale 1 puff into the lungs every 4 (four) hours as needed.      ascorbic acid (VITAMIN C) 500 MG tablet Take 500 mg by mouth daily in the afternoon. Most days     atenolol (TENORMIN) 25 MG tablet Take 12.5 mg by mouth daily as needed.     Calcium Carb-Cholecalciferol (CALCIUM-VITAMIN D) 600-400 MG-UNIT TABS Take 1 tablet by mouth daily at 10 pm.      cetirizine (ZYRTEC) 10 MG tablet Take 10 mg by mouth daily at 10 pm.      Cholecalciferol (D3 SUPER STRENGTH) 50 MCG (2000  UT) CAPS Take 2,000 Units by mouth daily in the afternoon.     citalopram (CELEXA) 40 MG tablet Take 40 mg by mouth daily at 10 pm.      ELIQUIS 5 MG TABS tablet TAKE 1 TABLET BY MOUTH TWICE DAILY AT  8  AM  AND  8  PM 180 tablet 1   furosemide (LASIX) 20 MG tablet Take 1 tablet (20 mg total) by mouth daily as needed for edema. 30 tablet 1   hyoscyamine  (LEVSIN SL) 0.125 MG SL tablet Place under the tongue every 4 (four) hours as needed.     levothyroxine (SYNTHROID, LEVOTHROID) 75 MCG tablet Take 75 mcg by mouth daily at 10 pm.      montelukast (SINGULAIR) 10 MG tablet Take 10 mg by mouth daily at 10 pm.      Multiple Vitamin (MULTIVITAMIN WITH MINERALS) TABS tablet Take 1 tablet by mouth daily.     No current facility-administered medications for this visit.     Allergies:   Paxil [paroxetine hcl]   Social History:  The patient  reports that she has never smoked. She has never used smokeless tobacco. She reports current alcohol use of about 1.0 standard drink per week. She reports that she does not use drugs.   Family History:   family history includes Breast cancer in her sister; Hypertension in her mother; Osteoporosis in her sister; Stroke in her father and mother.    Review of Systems: Review of Systems  Constitutional: Negative.   HENT: Negative.    Respiratory: Negative.    Cardiovascular: Negative.   Gastrointestinal: Negative.   Musculoskeletal: Negative.        Gait instability  Neurological: Negative.   Psychiatric/Behavioral: Negative.    All other systems reviewed and are negative.  PHYSICAL EXAM: VS:  BP (!) 142/78 (BP Location: Left Arm, Patient Position: Sitting, Cuff Size: Normal)   Pulse 60   Ht 5' (1.524 m)   Wt 140 lb 6 oz (63.7 kg)   SpO2 96%   BMI 27.42 kg/m  , BMI Body mass index is 27.42 kg/m.  Constitutional:  oriented to person, place, and time. No distress.  HENT:  Head: Grossly normal Eyes:  no discharge. No scleral icterus.  Neck: No JVD, no carotid bruits  Cardiovascular: Regular rate and rhythm, no murmurs appreciated Pulmonary/Chest: Clear to auscultation bilaterally, no wheezes or rails Abdominal: Soft.  no distension.  no tenderness.  Musculoskeletal: Normal range of motion Neurological:  normal muscle tone. Coordination normal. No atrophy Skin: Skin warm and dry Psychiatric: normal  affect, pleasant  Recent Labs: No results found for requested labs within last 8760 hours.    Lipid Panel No results found for: CHOL, HDL, LDLCALC, TRIG    Wt Readings from Last 3 Encounters:  08/05/21 140 lb 6 oz (63.7 kg)  02/02/21 145 lb (65.8 kg)  08/21/20 150 lb (68 kg)     ASSESSMENT AND PLAN:  Persistent atrial fibrillation History of sick sinus syndrome Back in NSR Takes atenolol 12.5 prn for palpitations on Eliquis 5 BID Lasix PRN for unilateral ankle swelling, take sparingly  Hernia Considering whether to meet with surgeon  Anxiety Managed by primary care stable  Hypothyroidism, unspecified type On thyroid supplement, stable  Hyperlipidemia Cholesterol 218 Does not want cholesterol medication  Uncomplicated asthma, unspecified asthma severity, unspecified whether persistent No recent issues, stable  Gait instability/risk of falls No recent falls, recommended regular walking program Foot pain, told to minimize weight  bearing   Total encounter time more than 25 minutes  Greater than 50% was spent in counseling and coordination of care with the patient   No orders of the defined types were placed in this encounter.    Signed, Esmond Plants, M.D., Ph.D. 08/05/2021  [facility] [place], [place]

## 2021-08-05 ENCOUNTER — Ambulatory Visit (INDEPENDENT_AMBULATORY_CARE_PROVIDER_SITE_OTHER): Payer: Medicare Other | Admitting: Cardiovascular Disease

## 2021-08-05 ENCOUNTER — Other Ambulatory Visit: Payer: Self-pay

## 2021-08-05 ENCOUNTER — Encounter: Payer: Self-pay | Admitting: Cardiovascular Disease

## 2021-08-05 VITALS — BP 142/78 | HR 60 | Ht 60.0 in | Wt 140.4 lb

## 2021-08-05 DIAGNOSIS — E782 Mixed hyperlipidemia: Secondary | ICD-10-CM

## 2021-08-05 DIAGNOSIS — R0600 Dyspnea, unspecified: Secondary | ICD-10-CM | POA: Diagnosis not present

## 2021-08-05 DIAGNOSIS — M353 Polymyalgia rheumatica: Secondary | ICD-10-CM | POA: Diagnosis not present

## 2021-08-05 DIAGNOSIS — I4892 Unspecified atrial flutter: Secondary | ICD-10-CM | POA: Diagnosis not present

## 2021-08-05 DIAGNOSIS — I4819 Other persistent atrial fibrillation: Secondary | ICD-10-CM | POA: Diagnosis not present

## 2021-08-05 DIAGNOSIS — R001 Bradycardia, unspecified: Secondary | ICD-10-CM

## 2021-08-05 DIAGNOSIS — R0609 Other forms of dyspnea: Secondary | ICD-10-CM

## 2021-08-05 MED ORDER — FUROSEMIDE 20 MG PO TABS: 20.0000 mg | ORAL_TABLET | Freq: Every day | ORAL | 3 refills | Status: DC | PRN

## 2021-08-05 MED ORDER — ATENOLOL 25 MG PO TABS: 12.5000 mg | ORAL_TABLET | Freq: Every day | ORAL | 3 refills | Status: DC | PRN

## 2021-08-05 NOTE — Patient Instructions (Signed)
Medication Instructions:  Please continue your current cardiac medications.   *If you need a refill on your cardiac medications before your next appointment, please call your pharmacy*  Lab Work: None  Testing/Procedures: None  Follow-Up: At [facility], you and your health needs are our priority.  As part of our continuing mission to provide you with exceptional heart care, we have created designated Provider Care Teams.  These Care Teams include your primary Cardiologist (physician) and Advanced Practice Providers (APPs -  Physician Assistants and Nurse Practitioners) who all work together to provide you with the care you need, when you need it.   Your next appointment:   12 month(s)  The format for your next appointment:   In Person  Provider:   You may see Dr. Rockey Situ or one of the following Advanced Practice Providers on your designated Care Team:   Murray Hodgkins, NP Christell Faith, PA-C Marrianne Mood, PA-C Cadence [place], [place]

## 2021-08-27 ENCOUNTER — Other Ambulatory Visit: Payer: Self-pay

## 2021-08-27 ENCOUNTER — Encounter: Payer: Self-pay | Admitting: Internal Medicine

## 2021-08-27 ENCOUNTER — Ambulatory Visit (INDEPENDENT_AMBULATORY_CARE_PROVIDER_SITE_OTHER): Payer: Medicare Other | Admitting: Internal Medicine

## 2021-08-27 VITALS — BP 128/88 | HR 74 | Ht 60.0 in | Wt 140.0 lb

## 2021-08-27 DIAGNOSIS — R001 Bradycardia, unspecified: Secondary | ICD-10-CM

## 2021-08-27 DIAGNOSIS — R0600 Dyspnea, unspecified: Secondary | ICD-10-CM | POA: Diagnosis not present

## 2021-08-27 DIAGNOSIS — I4819 Other persistent atrial fibrillation: Secondary | ICD-10-CM

## 2021-08-27 DIAGNOSIS — R0609 Other forms of dyspnea: Secondary | ICD-10-CM

## 2021-08-27 MED ORDER — APIXABAN 2.5 MG PO TABS: 2.5000 mg | ORAL_TABLET | Freq: Two times a day (BID) | ORAL | 6 refills | Status: DC

## 2021-08-27 NOTE — Patient Instructions (Signed)
Medication Instructions:  - Your physician has recommended you make the following change in your medication:   1) DECREASE Eliquis 2.5 mg- take 1 tablet by mouth TWICE daily  *If you need a refill on your cardiac medications before your next appointment, please call your pharmacy*   Lab Work: - none ordered  If you have labs (blood work) drawn today and your tests are completely normal, you will receive your results only by: Marie (if you have MyChart) OR A paper copy in the mail If you have any lab test that is abnormal or we need to change your treatment, we will call you to review the results.   Testing/Procedures: - none ordered   Follow-Up: At [facility], you and your health needs are our priority.  As part of our continuing mission to provide you with exceptional heart care, we have created designated Provider Care Teams.  These Care Teams include your primary Cardiologist (physician) and Advanced Practice Providers (APPs -  Physician Assistants and Nurse Practitioners) who all work together to provide you with the care you need, when you need it.  We recommend signing up for the patient portal called "MyChart".  Sign up information is provided on this After Visit Summary.  MyChart is used to connect with patients for Virtual Visits (Telemedicine).  Patients are able to view lab/test results, encounter notes, upcoming appointments, etc.  Non-urgent messages can be sent to your provider as well.   To learn more about what you can do with MyChart, go to [email or web address].    Your next appointment:   6 month(s)  The format for your next appointment:   In Person  Provider:   Virl Axe, MD   Other Instructions N/a

## 2021-08-27 NOTE — Progress Notes (Signed)
Patient Care Team: [place] Pink, MD as PCP - General (Family Medicine)   HPI  Amanda Dalton is a 87 y.o. female seen in follow-up for atrial fibrillation anticoagulated with apixaban as well as other palpitations.  Bradycardia has been an issue requiring the discontinuation of atenolol and amiodarone   Atrial fibrillation 5/19 presented with spontaneous conversion.   9/19 Personally reviewed  Monitoring>>paroxysms of atrial fib, some sinus brady  She saw Dr. Lake Bells 1/19 for abnormal PFTs with outflow obstruction.  Thought to be chronic asthma.  [place] inhalers     The patient denies chest pain, nocturnal dyspnea, orthopnea or peripheral edema.  There have been no palpitations, lightheadedness or syncope feels "wonderful "chronic mild dyspnea..  Husband wondered whether she needed to see both Dr. Deidre Ala and me, I offered the opportunity to see Dr. Deidre Ala alone    Date Cr K Hgb TSH  5/19 0.83  12.4   12/19  0.8     3/20 0.8  13.9 2.28  10/20 0.8     2/21 0.86  14   9/21 0.83  12.7   9/22 0.79 4.2 12.5 1.606       Thromboembolic risk factors ( age -26, Gender-1 ) for a CHADSVASc Score of 3     . DATE TEST EF    1/18 Echo   50-55 % LAE mild PA 50-108mm                Records and Results Reviewed   Past Medical History:  Diagnosis Date   Anxiety    Asthma    Atrial flutter ([place])    a. Dx 11/17/2016-->CHA2DS2VASc = 3-->Eliquis 5 mg BID.   Breast cancer ([place])    a. 1996 s/p R modified radical mastectomy-->chemo w/ tamoxifen.   Endometrial cancer ([place])    a. 08/2001 s/p hysterectomy. No node involvement.   Fracture of right humerus 11/2008   Hypothyroidism    Osteoporosis    Squamous cell carcinoma of skin 01/29/2010   Left superior side. KA, WD SCC not entirely excluded.    Past Surgical History:  Procedure Laterality Date   CARDIOVERSION N/A 04/19/2018   Procedure: CARDIOVERSION;  Surgeon: Minna Merritts, MD;  Location: [facility] ORS;  Service: Cardiovascular;   Laterality: N/A;   CARDIOVERSION N/A 01/18/2020   Procedure: CARDIOVERSION;  Surgeon: Minna Merritts, MD;  Location: [facility] ORS;  Service: Cardiovascular;  Laterality: N/A;   Right Mastectomy  1996   VAGINAL HYSTERECTOMY  08/2001    Current Meds  Medication Sig   albuterol (VENTOLIN HFA) 108 (90 Base) MCG/ACT inhaler Inhale 1 puff into the lungs every 4 (four) hours as needed.    ascorbic acid (VITAMIN C) 500 MG tablet Take 500 mg by mouth daily in the afternoon. Most days   atenolol (TENORMIN) 25 MG tablet Take 0.5 tablets (12.5 mg total) by mouth daily as needed.   Calcium Carb-Cholecalciferol (CALCIUM-VITAMIN D) 600-400 MG-UNIT TABS Take 1 tablet by mouth daily at 10 pm.    cetirizine (ZYRTEC) 10 MG tablet Take 10 mg by mouth daily at 10 pm.    Cholecalciferol (D3 SUPER STRENGTH) 50 MCG (2000 UT) CAPS Take 2,000 Units by mouth daily in the afternoon.   citalopram (CELEXA) 40 MG tablet Take 40 mg by mouth daily at 10 pm.    ELIQUIS 5 MG TABS tablet TAKE 1 TABLET BY MOUTH TWICE DAILY AT  8  AM  AND  8  PM  furosemide (LASIX) 20 MG tablet Take 1 tablet (20 mg total) by mouth daily as needed for edema.   levalbuterol (XOPENEX HFA) 45 MCG/ACT inhaler Inhale into the lungs.   levothyroxine (SYNTHROID, LEVOTHROID) 75 MCG tablet Take 75 mcg by mouth daily at 10 pm.    montelukast (SINGULAIR) 10 MG tablet Take 10 mg by mouth daily at 10 pm.    Multiple Vitamin (MULTIVITAMIN WITH MINERALS) TABS tablet Take 1 tablet by mouth daily.    Allergies  Allergen Reactions   Paxil [Paroxetine Hcl] Nausea Only      Review of Systems negative except from HPI and PMH  Physical Exam BP 128/88 (BP Location: Left Arm, Patient Position: Sitting, Cuff Size: Normal)   Pulse 74   Ht 5' (1.524 m)   Wt 140 lb (63.5 kg)   SpO2 97%   BMI 27.34 kg/m  Well developed and nourished in no acute distress HENT normal Neck supple with JVP-  flat   Clear Irregular rate and rhythm, no murmurs or  gallops Abd-soft with active BS No Clubbing cyanosis edema Skin-warm and dry A & Oriented  Grossly normal sensory and motor function  ECG atrial flutter-atypical ventricular rate 74 ECG 9/22 sinus rhythm at about 60 with PACs ECG 3/22 atypical atrial flutter  Assessment and  Plan  Atrial fibrillation-persistent   Bradycardia-sinus  Dyspnea on exertion  COPD  Fall   No interval falls  Unaware of her recurrent atrial fibrillation continue a strategy of as needed atenolol  Her weight is about 63 kg.  We will decrease her Eliquis from 5--2.5 mg twice daily.  She is currently euvolemic.  We will continue her on her Lasix 20 mg a couple times a week.            Current medicines are reviewed at length with the patient today .  The patient does not  have concerns regarding medicines.

## 2021-11-05 ENCOUNTER — Ambulatory Visit (INDEPENDENT_AMBULATORY_CARE_PROVIDER_SITE_OTHER): Payer: Medicare Other | Admitting: Dermatology

## 2021-11-05 ENCOUNTER — Other Ambulatory Visit: Payer: Self-pay

## 2021-11-05 DIAGNOSIS — Z85828 Personal history of other malignant neoplasm of skin: Secondary | ICD-10-CM | POA: Diagnosis not present

## 2021-11-05 DIAGNOSIS — L578 Other skin changes due to chronic exposure to nonionizing radiation: Secondary | ICD-10-CM

## 2021-11-05 DIAGNOSIS — D229 Melanocytic nevi, unspecified: Secondary | ICD-10-CM

## 2021-11-05 DIAGNOSIS — D1800 Hemangioma unspecified site: Secondary | ICD-10-CM

## 2021-11-05 DIAGNOSIS — L820 Inflamed seborrheic keratosis: Secondary | ICD-10-CM

## 2021-11-05 DIAGNOSIS — Z1283 Encounter for screening for malignant neoplasm of skin: Secondary | ICD-10-CM

## 2021-11-05 DIAGNOSIS — L821 Other seborrheic keratosis: Secondary | ICD-10-CM

## 2021-11-05 DIAGNOSIS — L814 Other melanin hyperpigmentation: Secondary | ICD-10-CM

## 2021-11-05 DIAGNOSIS — I872 Venous insufficiency (chronic) (peripheral): Secondary | ICD-10-CM | POA: Diagnosis not present

## 2021-11-05 NOTE — Progress Notes (Signed)
Follow-Up Visit   Subjective  Amanda Dalton is a 87 y.o. female who presents for the following: Follow-up (Patient here today for tbse. Patient reports some hand sensitivity and burning at left hand. Patient states hand has been hurting for a while. The patient presents for Total-Body Skin Exam (TBSE) for skin cancer screening and mole check.  The patient has spots, moles and lesions to be evaluated, some may be new or changing and the patient has concerns that these could be cancer.  The following portions of the chart were reviewed this encounter and updated as appropriate:  Tobacco  Allergies  Meds  Problems  Med Hx  Surg Hx  Fam Hx     Review of Systems: No other skin or systemic complaints except as noted in HPI or Assessment and Plan.  Objective  Well appearing patient in no apparent distress; mood and affect are within normal limits.  A full examination was performed including scalp, head, eyes, ears, nose, lips, neck, chest, axillae, abdomen, back, buttocks, bilateral upper extremities, bilateral lower extremities, hands, feet, fingers, toes, fingernails, and toenails. All findings within normal limits unless otherwise noted below.  right hand x 1 Erythematous keratotic or waxy stuck-on papule or plaque.   Left Lower Leg - Anterior Stasis changes    Assessment & Plan  Inflamed seborrheic keratosis right hand x 1 Destruction of lesion - right hand x 1 Complexity: simple   Destruction method: cryotherapy   Informed consent: discussed and consent obtained   Timeout:  patient name, date of birth, surgical site, and procedure verified Lesion destroyed using liquid nitrogen: Yes   Region frozen until ice ball extended beyond lesion: Yes   Outcome: patient tolerated procedure well with no complications   Post-procedure details: wound care instructions given   Additional details:  Prior to procedure, discussed risks of blister formation, small wound, skin dyspigmentation,  or rare scar following cryotherapy. Recommend Vaseline ointment to treated areas while healing.  Stasis dermatitis of both legs Left Lower Leg - Anterior Stasis in the legs causes chronic leg swelling, which may result in itchy or painful rashes, skin discoloration, skin texture changes, and sometimes ulceration.  Recommend daily graduated compression hose/stockings- easiest to put on first thing in morning, remove at bedtime.  Elevate legs as much as possible. Avoid salt/sodium rich foods.  Skin cancer screening  Lentigines - Scattered tan macules - Due to sun exposure - Benign-appearing, observe - Recommend daily broad spectrum sunscreen SPF 30+ to sun-exposed areas, reapply every 2 hours as needed. - Call for any changes  Seborrheic Keratoses - Stuck-on, waxy, tan-brown papules and/or plaques  - Benign-appearing - Discussed benign etiology and prognosis. - Observe - Call for any changes  Melanocytic Nevi - Tan-brown and/or pink-flesh-colored symmetric macules and papules - Benign appearing on exam today - Observation - Call clinic for new or changing moles - Recommend daily use of broad spectrum spf 30+ sunscreen to sun-exposed areas.   Hemangiomas - Red papules - Discussed benign nature - Observe - Call for any changes  Actinic Damage - Chronic condition, secondary to cumulative UV/sun exposure - diffuse scaly erythematous macules with underlying dyspigmentation - Recommend daily broad spectrum sunscreen SPF 30+ to sun-exposed areas, reapply every 2 hours as needed.  - Staying in the shade or wearing long sleeves, sun glasses (UVA+UVB protection) and wide brim hats (4-inch brim around the entire circumference of the hat) are also recommended for sun protection.  - Call for new or changing  lesions.  History of Squamous Cell Carcinoma of the Skin - No evidence of recurrence today left superior side (2011) - No lymphadenopathy - Recommend regular full body skin exams -  Recommend daily broad spectrum sunscreen SPF 30+ to sun-exposed areas, reapply every 2 hours as needed.  - Call if any new or changing lesions are noted between office visits  Skin cancer screening performed today.  Return for 1 year tbse.  IRuthell Rummage, CMA, am acting as scribe for Sarina Ser, MD. Documentation: I have reviewed the above documentation for accuracy and completeness, and I agree with the above.  Sarina Ser, MD

## 2021-11-05 NOTE — Patient Instructions (Signed)
Melanoma ABCDEs ? ?Melanoma is the most dangerous type of skin cancer, and is the leading cause of death from skin disease.  You are more likely to develop melanoma if you: ?Have light-colored skin, light-colored eyes, or red or blond hair ?Spend a lot of time in the sun ?Tan regularly, either outdoors or in a tanning bed ?Have had blistering sunburns, especially during childhood ?Have a close family member who has had a melanoma ?Have atypical moles or large birthmarks ? ?Early detection of melanoma is key since treatment is typically straightforward and cure rates are extremely high if we catch it early.  ? ?The first sign of melanoma is often a change in a mole or a new dark spot.  The ABCDE system is a way of remembering the signs of melanoma. ? ?A for asymmetry:  The two halves do not match. ?B for border:  The edges of the growth are irregular. ?C for color:  A mixture of colors are present instead of an even brown color. ?D for diameter:  Melanomas are usually (but not always) greater than 6mm - the size of a pencil eraser. ?E for evolution:  The spot keeps changing in size, shape, and color. ? ?Please check your skin once per month between visits. You can use a small mirror in front and a large mirror behind you to keep an eye on the back side or your body.  ? ?If you see any new or changing lesions before your next follow-up, please call to schedule a visit. ? ?Please continue daily skin protection including broad spectrum sunscreen SPF 30+ to sun-exposed areas, reapplying every 2 hours as needed when you're outdoors.  ? ?Staying in the shade or wearing long sleeves, sun glasses (UVA+UVB protection) and wide brim hats (4-inch brim around the entire circumference of the hat) are also recommended for sun protection.   ? ? ?If You Need Anything After Your Visit ? ?If you have any questions or concerns for your doctor, please call our main line at [phone] and press option 4 to reach your doctor's medical  assistant. If no one answers, please leave a voicemail as directed and we will return your call as soon as possible. Messages left after 4 pm will be answered the following business day.  ? ?You may also send us a message via MyChart. We typically respond to MyChart messages within 1-2 business days. ? ?For prescription refills, please ask your pharmacy to contact our office. Our fax number is [phone]. ? ?If you have an urgent issue when the clinic is closed that cannot wait until the next business day, you can page your doctor at the number below.   ? ?Please note that while we do our best to be available for urgent issues outside of office hours, we are not available 24/7.  ? ?If you have an urgent issue and are unable to reach us, you may choose to seek medical care at your doctor's office, retail clinic, urgent care center, or emergency room. ? ?If you have a medical emergency, please immediately call 911 or go to the emergency department. ? ?Pager Numbers ? ?- Dr. Kowalski: [phone] ? ?- Dr. Moye: [phone] ? ?- Dr. Stewart: [phone] ? ?In the event of inclement weather, please call our main line at [phone] for an update on the status of any delays or closures. ? ?Dermatology Medication Tips: ?Please keep the boxes that topical medications come in in order to help keep track of the instructions   about where and how to use these. Pharmacies typically print the medication instructions only on the boxes and not directly on the medication tubes.  ? ?If your medication is too expensive, please contact our office at [phone] option 4 or send us a message through MyChart.  ? ?We are unable to tell what your co-pay for medications will be in advance as this is different depending on your insurance coverage. However, we may be able to find a substitute medication at lower cost or fill out paperwork to get insurance to cover a needed medication.  ? ?If a prior authorization is required to get your  medication covered by your insurance company, please allow us 1-2 business days to complete this process. ? ?Drug prices often vary depending on where the prescription is filled and some pharmacies may offer cheaper prices. ? ?The website www.goodrx.com contains coupons for medications through different pharmacies. The prices here do not account for what the cost may be with help from insurance (it may be cheaper with your insurance), but the website can give you the price if you did not use any insurance.  ?- You can print the associated coupon and take it with your prescription to the pharmacy.  ?- You may also stop by our office during regular business hours and pick up a GoodRx coupon card.  ?- If you need your prescription sent electronically to a different pharmacy, notify our office through [facility] MyChart or by phone at [phone] option 4. ? ? ? ? ?Si Usted Necesita Algo Despu?s de Su Visita ? ?Tambi?n puede enviarnos un mensaje a trav?s de MyChart. Por lo general respondemos a los mensajes de MyChart en el transcurso de 1 a 2 d?as h?biles. ? ?Para renovar recetas, por favor pida a su farmacia que se ponga en contacto con nuestra oficina. Nuestro n?mero de fax es el [phone]. ? ?Si tiene un asunto urgente cuando la cl?nica est? cerrada y que no puede esperar hasta el siguiente d?a h?bil, puede llamar/localizar a su doctor(a) al n?mero que aparece a continuaci?n.  ? ?Por favor, tenga en cuenta que aunque hacemos todo lo posible para estar disponibles para asuntos urgentes fuera del horario de oficina, no estamos disponibles las 24 horas del d?a, los 7 d?as de la semana.  ? ?Si tiene un problema urgente y no puede comunicarse con nosotros, puede optar por buscar atenci?n m?dica  en el consultorio de su doctor(a), en una cl?nica privada, en un centro de atenci?n urgente o en una sala de emergencias. ? ?Si tiene una emergencia m?dica, por favor llame inmediatamente al 911 o vaya a la sala de  emergencias. ? ?N?meros de b?per ? ?- Dr. Kowalski: [phone] ? ?- Dra. Moye: [phone] ? ?- Dra. Stewart: [phone] ? ?En caso de inclemencias del tiempo, por favor llame a nuestra l?nea principal al [phone] para una actualizaci?n sobre el estado de cualquier retraso o cierre. ? ?Consejos para la medicaci?n en dermatolog?a: ?Por favor, guarde las cajas en las que vienen los medicamentos de uso t?pico para ayudarle a seguir las instrucciones sobre d?nde y c?mo usarlos. Las farmacias generalmente imprimen las instrucciones del medicamento s?lo en las cajas y no directamente en los tubos del medicamento.  ? ?Si su medicamento es muy caro, por favor, p?ngase en contacto con nuestra oficina llamando al [phone] y presione la opci?n 4 o env?enos un mensaje a trav?s de MyChart.  ? ?No podemos decirle cu?l ser? su copago por los medicamentos por adelantado ya que   esto es diferente dependiendo de la cobertura de su seguro. Sin embargo, es posible que podamos encontrar un medicamento sustituto a menor costo o llenar un formulario para que el seguro cubra el medicamento que se considera necesario.  ? ?Si se requiere una autorizaci?n previa para que su compa??a de seguros cubra su medicamento, por favor perm?tanos de 1 a 2 d?as h?biles para completar este proceso. ? ?Los precios de los medicamentos var?an con frecuencia dependiendo del lugar de d?nde se surte la receta y alguna farmacias pueden ofrecer precios m?s baratos. ? ?El sitio web www.goodrx.com tiene cupones para medicamentos de diferentes farmacias. Los precios aqu? no tienen en cuenta lo que podr?a costar con la ayuda del seguro (puede ser m?s barato con su seguro), pero el sitio web puede darle el precio si no utiliz? ning?n seguro.  ?- Puede imprimir el cup?n correspondiente y llevarlo con su receta a la farmacia.  ?- Tambi?n puede pasar por nuestra oficina durante el horario de atenci?n regular y recoger una tarjeta de cupones de GoodRx.  ?- Si  necesita que su receta se env?e electr?nicamente a una farmacia diferente, informe a nuestra oficina a trav?s de MyChart de [facility] o por tel?fono llamando al [phone] y presione la opci?n 4. ? ?

## 2021-11-12 ENCOUNTER — Ambulatory Visit (INDEPENDENT_AMBULATORY_CARE_PROVIDER_SITE_OTHER): Payer: Medicare Other | Admitting: Podiatry

## 2021-11-12 ENCOUNTER — Other Ambulatory Visit: Payer: Self-pay

## 2021-11-12 DIAGNOSIS — M778 Other enthesopathies, not elsewhere classified: Secondary | ICD-10-CM

## 2021-11-13 NOTE — Progress Notes (Signed)
Subjective:  Patient ID: Amanda Dalton, female    DOB: June 06, 1931,  MRN: [id number]  Chief Complaint  Patient presents with   Toe Pain    Right foot sore toes    87 y.o. female presents with the above complaint.  Patient presents with complaint of right dorsal foot cam.  She states is very painful to touch painful to walk on.  She states it is cramping in her toes.  It hurts with ambulation.  She states this started out of nowhere and has progressed to gotten worse.  She would like to discuss treatment options for this.  She has not tried anything for me.  Her pain scale is 8 out of 10 hurts with ambulation   Review of Systems: Negative except as noted in the HPI. Denies N/V/F/Ch.  Past Medical History:  Diagnosis Date   Anxiety    Asthma    Atrial flutter ([place])    a. Dx 11/17/2016-->CHA2DS2VASc = 3-->Eliquis 5 mg BID.   Breast cancer ([place])    a. 1996 s/p R modified radical mastectomy-->chemo w/ tamoxifen.   Endometrial cancer ([place])    a. 08/2001 s/p hysterectomy. No node involvement.   Fracture of right humerus 11/2008   Hypothyroidism    Osteoporosis    Squamous cell carcinoma of skin 01/29/2010   Left superior side. KA, WD SCC not entirely excluded.    Current Outpatient Medications:    albuterol (VENTOLIN HFA) 108 (90 Base) MCG/ACT inhaler, Inhale 1 puff into the lungs every 4 (four) hours as needed. , Disp: , Rfl:    apixaban (ELIQUIS) 2.5 MG TABS tablet, Take 1 tablet (2.5 mg total) by mouth 2 (two) times daily., Disp: 60 tablet, Rfl: 6   ascorbic acid (VITAMIN C) 500 MG tablet, Take 500 mg by mouth daily in the afternoon. Most days, Disp: , Rfl:    atenolol (TENORMIN) 25 MG tablet, Take 0.5 tablets (12.5 mg total) by mouth daily as needed., Disp: 90 tablet, Rfl: 3   Calcium Carb-Cholecalciferol (CALCIUM-VITAMIN D) 600-400 MG-UNIT TABS, Take 1 tablet by mouth daily at 10 pm. , Disp: , Rfl:    cetirizine (ZYRTEC) 10 MG tablet, Take 10 mg by mouth daily at 10 pm. , Disp: ,  Rfl:    Cholecalciferol (D3 SUPER STRENGTH) 50 MCG (2000 UT) CAPS, Take 2,000 Units by mouth daily in the afternoon., Disp: , Rfl:    citalopram (CELEXA) 40 MG tablet, Take 40 mg by mouth daily at 10 pm. , Disp: , Rfl:    furosemide (LASIX) 20 MG tablet, Take 1 tablet (20 mg total) by mouth daily as needed for edema., Disp: 90 tablet, Rfl: 3   hyoscyamine (LEVSIN SL) 0.125 MG SL tablet, Place under the tongue every 4 (four) hours as needed., Disp: , Rfl:    levalbuterol (XOPENEX HFA) 45 MCG/ACT inhaler, Inhale into the lungs., Disp: , Rfl:    levothyroxine (SYNTHROID, LEVOTHROID) 75 MCG tablet, Take 75 mcg by mouth daily at 10 pm. , Disp: , Rfl:    montelukast (SINGULAIR) 10 MG tablet, Take 10 mg by mouth daily at 10 pm. , Disp: , Rfl:    Multiple Vitamin (MULTIVITAMIN WITH MINERALS) TABS tablet, Take 1 tablet by mouth daily., Disp: , Rfl:   Social History   Tobacco Use  Smoking Status Never  Smokeless Tobacco Never    Allergies  Allergen Reactions   Paxil [Paroxetine Hcl] Nausea Only   Objective:  There were no vitals filed for this visit. There  is no height or weight on file to calculate BMI. Constitutional Well developed. Well nourished.  Vascular Dorsalis pedis pulses palpable bilaterally. Posterior tibial pulses palpable bilaterally. Capillary refill normal to all digits.  No cyanosis or clubbing noted. Pedal hair growth normal.  Neurologic Normal speech. Oriented to person, place, and time. Epicritic sensation to light touch grossly present bilaterally.  Dermatologic Nails well groomed and normal in appearance. No open wounds. No skin lesions.  Orthopedic: Hammertoe contractures noted of right foot.  Extensor substitution type of contractures appreciated.  There is semiflexible in nature.  Pain on palpation to the hammertoe contractures.  Pain with resisted dorsiflexion of the digits consistent with extensor tendinitis foot.   Radiographs: None Assessment:   1.  Extensor tendinitis of foot    Plan:  Patient was evaluated and treated and all questions answered.  Right extensor tendinitis with underlying hammertoe contracture secondary to extensor substitution -I explained the patient the etiology of tendinitis and various treatment options were extensively discussed.  Given the amount of pain that she is having she would ultimately benefit from cam boot immobilization however she is unable to tolerate the cam boot therefore I believe she would benefit from a surgical shoe to allow the toes to be immobilized.  However extensor tendinitis more localized to the toes and not proximal to that.  I am hopeful that the surgical shoe can help with that.  She states understanding -Surgical shoe was dispensed  No follow-ups on file.

## 2021-11-14 ENCOUNTER — Encounter: Payer: Self-pay | Admitting: Dermatology

## 2021-12-10 ENCOUNTER — Ambulatory Visit (INDEPENDENT_AMBULATORY_CARE_PROVIDER_SITE_OTHER): Payer: Medicare Other | Admitting: Podiatry

## 2021-12-10 ENCOUNTER — Encounter: Payer: Self-pay | Admitting: Podiatry

## 2021-12-10 ENCOUNTER — Other Ambulatory Visit: Payer: Self-pay

## 2021-12-10 DIAGNOSIS — M778 Other enthesopathies, not elsewhere classified: Secondary | ICD-10-CM

## 2021-12-10 NOTE — Progress Notes (Signed)
Subjective:  Patient ID: Amanda Dalton, female    DOB: 01-Feb-1931,  MRN: [id number]  Chief Complaint  Patient presents with   Toe Pain    "I'm still having some toe problems but not as bad as before."    88 y.o. female presents with the above complaint.  Patient presents with complaint of right dorsal foot cam.  She states is very painful to touch painful to walk on.  She states it is cramping in her toes.  It hurts with ambulation.  She states this started out of nowhere and has progressed to gotten worse.  She would like to discuss treatment options for this.  She has not tried anything for me.  Her pain scale is 8 out of 10 hurts with ambulation   Review of Systems: Negative except as noted in the HPI. Denies N/V/F/Ch.  Past Medical History:  Diagnosis Date   Anxiety    Asthma    Atrial flutter ([place])    a. Dx 11/17/2016-->CHA2DS2VASc = 3-->Eliquis 5 mg BID.   Breast cancer ([place])    a. 1996 s/p R modified radical mastectomy-->chemo w/ tamoxifen.   Endometrial cancer ([place])    a. 08/2001 s/p hysterectomy. No node involvement.   Fracture of right humerus 11/2008   Hypothyroidism    Osteoporosis    Squamous cell carcinoma of skin 01/29/2010   Left superior side. KA, WD SCC not entirely excluded.    Current Outpatient Medications:    albuterol (VENTOLIN HFA) 108 (90 Base) MCG/ACT inhaler, Inhale 1 puff into the lungs every 4 (four) hours as needed. , Disp: , Rfl:    apixaban (ELIQUIS) 2.5 MG TABS tablet, Take 1 tablet (2.5 mg total) by mouth 2 (two) times daily., Disp: 60 tablet, Rfl: 6   ascorbic acid (VITAMIN C) 500 MG tablet, Take 500 mg by mouth daily in the afternoon. Most days, Disp: , Rfl:    atenolol (TENORMIN) 25 MG tablet, Take 0.5 tablets (12.5 mg total) by mouth daily as needed., Disp: 90 tablet, Rfl: 3   Calcium Carb-Cholecalciferol (CALCIUM-VITAMIN D) 600-400 MG-UNIT TABS, Take 1 tablet by mouth daily at 10 pm. , Disp: , Rfl:    cetirizine (ZYRTEC) 10 MG tablet, Take  10 mg by mouth daily at 10 pm. , Disp: , Rfl:    Cholecalciferol (D3 SUPER STRENGTH) 50 MCG (2000 UT) CAPS, Take 2,000 Units by mouth daily in the afternoon., Disp: , Rfl:    citalopram (CELEXA) 40 MG tablet, Take 40 mg by mouth daily at 10 pm. , Disp: , Rfl:    furosemide (LASIX) 20 MG tablet, Take 1 tablet (20 mg total) by mouth daily as needed for edema., Disp: 90 tablet, Rfl: 3   hyoscyamine (LEVSIN SL) 0.125 MG SL tablet, Place under the tongue every 4 (four) hours as needed., Disp: , Rfl:    levalbuterol (XOPENEX HFA) 45 MCG/ACT inhaler, Inhale into the lungs., Disp: , Rfl:    levothyroxine (SYNTHROID, LEVOTHROID) 75 MCG tablet, Take 75 mcg by mouth daily at 10 pm. , Disp: , Rfl:    montelukast (SINGULAIR) 10 MG tablet, Take 10 mg by mouth daily at 10 pm. , Disp: , Rfl:    Multiple Vitamin (MULTIVITAMIN WITH MINERALS) TABS tablet, Take 1 tablet by mouth daily., Disp: , Rfl:   Social History   Tobacco Use  Smoking Status Never  Smokeless Tobacco Never    Allergies  Allergen Reactions   Paxil [Paroxetine Hcl] Nausea Only   Objective:  There  were no vitals filed for this visit. There is no height or weight on file to calculate BMI. Constitutional Well developed. Well nourished.  Vascular Dorsalis pedis pulses palpable bilaterally. Posterior tibial pulses palpable bilaterally. Capillary refill normal to all digits.  No cyanosis or clubbing noted. Pedal hair growth normal.  Neurologic Normal speech. Oriented to person, place, and time. Epicritic sensation to light touch grossly present bilaterally.  Dermatologic Nails well groomed and normal in appearance. No open wounds. No skin lesions.  Orthopedic: Hammertoe contractures noted of right foot.  Extensor substitution type of contractures appreciated.  There is semiflexible in nature.  Pain on palpation to the hammertoe contractures.  Pain with resisted dorsiflexion of the digits consistent with extensor tendinitis foot.    Radiographs: None Assessment:   1. Extensor tendinitis of foot     Plan:  Patient was evaluated and treated and all questions answered.  Right extensor tendinitis with underlying hammertoe contracture secondary to extensor substitution -I explained the patient the etiology of tendinitis and various treatment options were extensively discussed.  Given the amount of pain that she is having she would ultimately benefit from cam boot immobilization however she is unable to tolerate the cam boot therefore I believe she would benefit from a surgical shoe to allow the toes to be immobilized.  However extensor tendinitis more localized to the toes and not proximal to that.  I am hopeful that the surgical shoe can help with that.  She states understanding -Surgical shoe was dispensed  No follow-ups on file.

## 2021-12-10 NOTE — Progress Notes (Signed)
Subjective:  Patient ID: Amanda Dalton, female    DOB: 23-Aug-1931,  MRN: [id number]  Chief Complaint  Patient presents with   Toe Pain    "I'm still having some toe problems but not as bad as before."    88 y.o. female presents with the above complaint.  Patient presents with complaint of right dorsal foot cam.  She states she is doing better.  The surgical shoe helped.  Her pain is very mild in nature.  She denies any other acute complaints.  Review of Systems: Negative except as noted in the HPI. Denies N/V/F/Ch.  Past Medical History:  Diagnosis Date   Anxiety    Asthma    Atrial flutter ([place])    a. Dx 11/17/2016-->CHA2DS2VASc = 3-->Eliquis 5 mg BID.   Breast cancer ([place])    a. 1996 s/p R modified radical mastectomy-->chemo w/ tamoxifen.   Endometrial cancer ([place])    a. 08/2001 s/p hysterectomy. No node involvement.   Fracture of right humerus 11/2008   Hypothyroidism    Osteoporosis    Squamous cell carcinoma of skin 01/29/2010   Left superior side. KA, WD SCC not entirely excluded.    Current Outpatient Medications:    albuterol (VENTOLIN HFA) 108 (90 Base) MCG/ACT inhaler, Inhale 1 puff into the lungs every 4 (four) hours as needed. , Disp: , Rfl:    apixaban (ELIQUIS) 2.5 MG TABS tablet, Take 1 tablet (2.5 mg total) by mouth 2 (two) times daily., Disp: 60 tablet, Rfl: 6   ascorbic acid (VITAMIN C) 500 MG tablet, Take 500 mg by mouth daily in the afternoon. Most days, Disp: , Rfl:    atenolol (TENORMIN) 25 MG tablet, Take 0.5 tablets (12.5 mg total) by mouth daily as needed., Disp: 90 tablet, Rfl: 3   Calcium Carb-Cholecalciferol (CALCIUM-VITAMIN D) 600-400 MG-UNIT TABS, Take 1 tablet by mouth daily at 10 pm. , Disp: , Rfl:    cetirizine (ZYRTEC) 10 MG tablet, Take 10 mg by mouth daily at 10 pm. , Disp: , Rfl:    Cholecalciferol (D3 SUPER STRENGTH) 50 MCG (2000 UT) CAPS, Take 2,000 Units by mouth daily in the afternoon., Disp: , Rfl:    citalopram (CELEXA) 40 MG tablet,  Take 40 mg by mouth daily at 10 pm. , Disp: , Rfl:    furosemide (LASIX) 20 MG tablet, Take 1 tablet (20 mg total) by mouth daily as needed for edema., Disp: 90 tablet, Rfl: 3   hyoscyamine (LEVSIN SL) 0.125 MG SL tablet, Place under the tongue every 4 (four) hours as needed., Disp: , Rfl:    levalbuterol (XOPENEX HFA) 45 MCG/ACT inhaler, Inhale into the lungs., Disp: , Rfl:    levothyroxine (SYNTHROID, LEVOTHROID) 75 MCG tablet, Take 75 mcg by mouth daily at 10 pm. , Disp: , Rfl:    montelukast (SINGULAIR) 10 MG tablet, Take 10 mg by mouth daily at 10 pm. , Disp: , Rfl:    Multiple Vitamin (MULTIVITAMIN WITH MINERALS) TABS tablet, Take 1 tablet by mouth daily., Disp: , Rfl:   Social History   Tobacco Use  Smoking Status Never  Smokeless Tobacco Never    Allergies  Allergen Reactions   Paxil [Paroxetine Hcl] Nausea Only   Objective:  There were no vitals filed for this visit. There is no height or weight on file to calculate BMI. Constitutional Well developed. Well nourished.  Vascular Dorsalis pedis pulses palpable bilaterally. Posterior tibial pulses palpable bilaterally. Capillary refill normal to all digits.  No cyanosis  or clubbing noted. Pedal hair growth normal.  Neurologic Normal speech. Oriented to person, place, and time. Epicritic sensation to light touch grossly present bilaterally.  Dermatologic Nails well groomed and normal in appearance. No open wounds. No skin lesions.  Orthopedic: Hammertoe contractures noted of right foot.  Extensor substitution type of contractures appreciated.  There is semiflexible in nature.  Very mild pain on palpation to the hammertoe contractures.  Mild pain with resisted dorsiflexion of the digits consistent with extensor tendinitis foot.   Radiographs: None Assessment:   No diagnosis found.  Plan:  Patient was evaluated and treated and all questions answered.  Right extensor tendinitis with underlying hammertoe contracture  secondary to extensor substitution -I explained the patient the etiology of tendinitis and various treatment options were extensively discussed.   -Clinically her pain is improving of asked her to trust start transitioning from surgical shoe into regular shoes.  She states understanding will do so. -If any foot and ankle issues arise in future of asked her to come see me.  She states understanding. -Shoe gear remote fixation was discussed.  No follow-ups on file.

## 2022-03-11 ENCOUNTER — Encounter: Payer: Self-pay | Admitting: Internal Medicine

## 2022-03-11 ENCOUNTER — Ambulatory Visit (INDEPENDENT_AMBULATORY_CARE_PROVIDER_SITE_OTHER): Payer: Medicare Other | Admitting: Internal Medicine

## 2022-03-11 VITALS — BP 138/96 | HR 77 | Ht 60.0 in | Wt 150.0 lb

## 2022-03-11 DIAGNOSIS — R0609 Other forms of dyspnea: Secondary | ICD-10-CM

## 2022-03-11 DIAGNOSIS — I4819 Other persistent atrial fibrillation: Secondary | ICD-10-CM

## 2022-03-11 DIAGNOSIS — R001 Bradycardia, unspecified: Secondary | ICD-10-CM | POA: Diagnosis not present

## 2022-03-11 NOTE — Progress Notes (Signed)
? ? ? ? ?Patient Care Team: ?[place] Pink, MD as PCP - General (Family Medicine) ? ? ?HPI ? ?Amanda Dalton is a 88 y.o. female seen in follow-up for atrial fibrillation anticoagulated with apixaban as well as other palpitations.  Bradycardia has been an issue requiring the discontinuation of atenolol and amiodarone ?  ?Atrial fibrillation 5/19 presented with spontaneous conversion. ?  ?9/19 Personally reviewed  Monitoring>>paroxysms of atrial fib, some sinus brady ? ?She saw Dr. Lake Bells 1/19 for abnormal PFTs with outflow obstruction.  Thought to be chronic asthma.  Rx w/ inhalers    ? ?The patient denies chest pain, nocturnal dyspnea, orthopnea.  There have been no palpitations, lightheadedness or syncope.  Complains of chronic stable dyspnea and chronic stable edema.  She does not take her diuretics regularly..  ? ?  ?Date Cr K Hgb TSH  ?5/19 0.83  12.4   ?12/19  0.8     ?3/20 0.8  13.9 2.28  ?10/20 0.8     ?2/21 0.86  14   ?9/21 0.83  12.7   ?9/22 0.79 4.2 12.5 1.606  ?3/23 0.8 4.6 13.9   ? ?  ?  ?Thromboembolic risk factors ( age -103, Gender-1 ) for a CHADSVASc Score of 3 ?  ?  ?. ?DATE TEST EF    ?1/18 Echo   50-55 % LAE mild PA 50-17m  ?         ?  ?  ? ?Records and Results Reviewed  ? ?Past Medical History:  ?Diagnosis Date  ? Anxiety   ? Asthma   ? [place]   ? a. Dx 11/17/2016-->CHA2DS2VASc = 3-->Eliquis 5 mg BID.  ? Breast cancer ([place]   ? a. 1996 s/p R modified radical mastectomy-->chemo w/ tamoxifen.  ? [place] ([place]   ? a. 08/2001 s/p hysterectomy. No node involvement.  ? Fracture of right humerus 11/2008  ? Hypothyroidism   ? Osteoporosis   ? Squamous cell carcinoma of skin 01/29/2010  ? Left superior side. KA, WD SCC not entirely excluded.  ? ? ?Past Surgical History:  ?Procedure Laterality Date  ? CARDIOVERSION N/A 04/19/2018  ? Procedure: CARDIOVERSION;  Surgeon: GMinna Merritts MD;  Location: [facility] ORS;  Service: Cardiovascular;  Laterality: N/A;  ? CARDIOVERSION N/A  01/18/2020  ? Procedure: CARDIOVERSION;  Surgeon: GMinna Merritts MD;  Location: [facility] ORS;  Service: Cardiovascular;  Laterality: N/A;  ? Right Mastectomy  1996  ? VAGINAL HYSTERECTOMY  08/2001  ? ? ?Current Meds  ?Medication Sig  ? albuterol (VENTOLIN HFA) 108 (90 Base) MCG/ACT inhaler Inhale 1 puff into the lungs every 4 (four) hours as needed.   ? apixaban (ELIQUIS) 2.5 MG TABS tablet Take 1 tablet (2.5 mg total) by mouth 2 (two) times daily.  ? ascorbic acid (VITAMIN C) 500 MG tablet Take 500 mg by mouth daily in the afternoon. Most days  ? atenolol (TENORMIN) 25 MG tablet Take 0.5 tablets (12.5 mg total) by mouth daily as needed.  ? Calcium Carb-Cholecalciferol (CALCIUM-VITAMIN D) 600-400 MG-UNIT TABS Take 1 tablet by mouth daily at 10 pm.   ? cetirizine (ZYRTEC) 10 MG tablet Take 10 mg by mouth daily at 10 pm.   ? Cholecalciferol (D3 SUPER STRENGTH) 50 MCG (2000 UT) CAPS Take 2,000 Units by mouth daily in the afternoon.  ? citalopram (CELEXA) 40 MG tablet Take 40 mg by mouth daily at 10 pm.   ? furosemide (LASIX) 20 MG tablet Take 1 tablet (20  mg total) by mouth daily as needed for edema.  ? hyoscyamine (LEVSIN SL) 0.125 MG SL tablet Place under the tongue every 4 (four) hours as needed.  ? ipratropium (ATROVENT) 0.06 % nasal spray Place 2 sprays into both nostrils 3 (three) times daily.  ? levalbuterol (XOPENEX HFA) 45 MCG/ACT inhaler Inhale into the lungs.  ? levothyroxine (SYNTHROID, LEVOTHROID) 75 MCG tablet Take 75 mcg by mouth daily at 10 pm.   ? montelukast (SINGULAIR) 10 MG tablet Take 10 mg by mouth daily at 10 pm.   ? Multiple Vitamin (MULTIVITAMIN WITH MINERALS) TABS tablet Take 1 tablet by mouth daily.  ? ? ?Allergies  ?Allergen Reactions  ? Paxil [Paroxetine Hcl] Nausea Only  ? ? ? ? ?Review of Systems negative except from HPI and PMH ? ?Physical Exam ?BP (!) 138/96 (BP Location: Left Arm, Patient Position: Sitting, Cuff Size: Normal)   Pulse 77   Ht 5' (1.524 m)   Wt 150 lb (68 kg)   SpO2  95%   BMI 29.29 kg/m?  ?Well developed and nourished in no acute distress ?HENT normal ?Neck supple with JVP-  flat  ?Clear ?Regular rate and rhythm, no murmurs or gallops ?Abd-soft with active BS ?No Clubbing cyanosis edema ?Skin-warm and dry ?A & Oriented  Grossly normal sensory and motor function ? ?ECG atrial fibrillation at 77 ?Oh-/09/39 ? ?ECG atrial flutter-atypical ventricular rate 74 ?ECG 9/22 sinus rhythm at about 60 with PACs ?ECG 3/22 atypical atrial flutter ? ?Assessment and  Plan ? ?Atrial fibrillation-persistent/permanent ? ?Dyspnea on exertion ? ?COPD ? ?Fall ?  ?No interval falls. ? ?Atrial fibrillation persists.  We will classify it PERMANENT  ? ?No bleeding.  We will continue Eliquis at 2.5 mg twice daily. ?Euvolemic.  Continue her furosemide 20 mg as needed. ? ?Blood pressure is a little bit elevated; she will follow it at home and let us know. ? ?This is an ongoing issue.  I have reached out to Dr. Deidre Ala who concurs that the SGLT2 may be of benefit.  I have asked her to talk with her pulmonologist about this.  At [facility] ? ? ?  ? ? ?  ? ? ? ?Current medicines are reviewed at length with the patient today .  The patient does not  have concerns regarding medicines. ? ?

## 2022-03-11 NOTE — Patient Instructions (Addendum)
Medication Instructions:  - Your physician recommends that you continue on your current medications as directed. Please refer to the Current Medication list given to you today.  *If you need a refill on your cardiac medications before your next appointment, please call your pharmacy*   Lab Work: - none ordered  If you have labs (blood work) drawn today and your tests are completely normal, you will receive your results only by: MyChart Message (if you have MyChart) OR A paper copy in the mail If you have any lab test that is abnormal or we need to change your treatment, we will call you to review the results.   Testing/Procedures: - none ordered   Follow-Up: At [facility], you and your health needs are our priority.  As part of our continuing mission to provide you with exceptional heart care, we have created designated Provider Care Teams.  These Care Teams include your primary Cardiologist (physician) and Advanced Practice Providers (APPs -  Physician Assistants and Nurse Practitioners) who all work together to provide you with the care you need, when you need it.  We recommend signing up for the patient portal called "MyChart".  Sign up information is provided on this After Visit Summary.  MyChart is used to connect with patients for Virtual Visits (Telemedicine).  Patients are able to view lab/test results, encounter notes, upcoming appointments, etc.  Non-urgent messages can be sent to your provider as well.   To learn more about what you can do with MyChart, go to [email or web address].    Your next appointment:   1 year(s)  The format for your next appointment:   In Person  Provider:   Steven Klein, MD    Other Instructions N/a  Important Information About Sugar       

## 2022-03-31 ENCOUNTER — Other Ambulatory Visit: Payer: Self-pay | Admitting: Internal Medicine

## 2022-03-31 DIAGNOSIS — I4819 Other persistent atrial fibrillation: Secondary | ICD-10-CM

## 2022-03-31 NOTE — Telephone Encounter (Signed)
Eliquis 2.'5mg'$  refill request received. Patient is 88 years old, weight-68kg, Crea- 0.80 on 02/25/2022 via [place] at [place], [place], and last seen by Dr. Caryl Comes on 03/11/2022. Dose is inappropriate based on dosing criteria, however, per 08/27/2021 OV note by Dr. Caryl Comes it states: Her weight is about 63 kg.  We will decrease her Eliquis from 5--2.5 mg twice daily. Also, on 03/11/2022 OV note by Dr. Caryl Comes it states: "We will continue Eliquis at 2.5 mg twice daily."  Will send in refill to requested pharmacy.   ?

## 2022-06-01 ENCOUNTER — Other Ambulatory Visit: Payer: Self-pay | Admitting: Marriage & Family Therapist

## 2022-06-01 ENCOUNTER — Ambulatory Visit
Admission: AD | Admit: 2022-06-01 | Discharge: 2022-06-01 | Disposition: A | Discharge status: Home / Self Care | Payer: Medicare Other | Source: Other Acute Inpatient Hospital | Attending: Orthopedic Surgery | Admitting: Orthopedic Surgery

## 2022-06-01 ENCOUNTER — Ambulatory Visit
Admission: RE | Admit: 2022-06-01 | Discharge: 2022-06-01 | Disposition: A | Discharge status: Home / Self Care | Payer: Medicare Other | Source: Ambulatory Visit | Attending: Marriage & Family Therapist | Admitting: Marriage & Family Therapist

## 2022-06-01 DIAGNOSIS — M79604 Pain in right leg: Secondary | ICD-10-CM

## 2022-06-18 ENCOUNTER — Emergency Department
Admission: EM | Admit: 2022-06-18 | Discharge: 2022-06-18 | Disposition: A | Discharge status: Home / Self Care | Payer: Medicare Other | Attending: Emergency Medicine | Admitting: Emergency Medicine

## 2022-06-18 ENCOUNTER — Other Ambulatory Visit: Payer: Self-pay

## 2022-06-18 ENCOUNTER — Emergency Department: Payer: Medicare Other

## 2022-06-18 DIAGNOSIS — J45909 Unspecified asthma, uncomplicated: Secondary | ICD-10-CM | POA: Diagnosis not present

## 2022-06-18 DIAGNOSIS — Z85828 Personal history of other malignant neoplasm of skin: Secondary | ICD-10-CM | POA: Diagnosis not present

## 2022-06-18 DIAGNOSIS — Z853 Personal history of malignant neoplasm of breast: Secondary | ICD-10-CM | POA: Diagnosis not present

## 2022-06-18 DIAGNOSIS — R609 Edema, unspecified: Secondary | ICD-10-CM

## 2022-06-18 DIAGNOSIS — M7989 Other specified soft tissue disorders: Secondary | ICD-10-CM | POA: Diagnosis present

## 2022-06-18 DIAGNOSIS — Z8542 Personal history of malignant neoplasm of other parts of uterus: Secondary | ICD-10-CM | POA: Diagnosis not present

## 2022-06-18 DIAGNOSIS — R600 Localized edema: Secondary | ICD-10-CM | POA: Diagnosis not present

## 2022-06-18 DIAGNOSIS — E039 Hypothyroidism, unspecified: Secondary | ICD-10-CM | POA: Diagnosis not present

## 2022-06-18 LAB — CBC WITH DIFFERENTIAL/PLATELET
Abs Immature Granulocytes: 0.03 10*3/uL (ref 0.00–0.07)
Basophils Absolute: 0.1 10*3/uL (ref 0.0–0.1)
Basophils Relative: 1 %
Eosinophils Absolute: 0.2 10*3/uL (ref 0.0–0.5)
Eosinophils Relative: 3 %
HCT: 34.9 % — ABNORMAL LOW (ref 36.0–46.0)
Hemoglobin: 11.3 g/dL — ABNORMAL LOW (ref 12.0–15.0)
Immature Granulocytes: 0 %
Lymphocytes Relative: 23 %
Lymphs Abs: 1.6 10*3/uL (ref 0.7–4.0)
MCH: 31.2 pg (ref 26.0–34.0)
MCHC: 32.4 g/dL (ref 30.0–36.0)
MCV: 96.4 fL (ref 80.0–100.0)
Monocytes Absolute: 0.6 10*3/uL (ref 0.1–1.0)
Monocytes Relative: 8 %
Neutro Abs: 4.7 10*3/uL (ref 1.7–7.7)
Neutrophils Relative %: 65 %
Platelets: 336 10*3/uL (ref 150–400)
RBC: 3.62 MIL/uL — ABNORMAL LOW (ref 3.87–5.11)
RDW: 13.1 % (ref 11.5–15.5)
WBC: 7.2 10*3/uL (ref 4.0–10.5)
nRBC: 0 % (ref 0.0–0.2)

## 2022-06-18 LAB — COMPREHENSIVE METABOLIC PANEL
ALT: 15 U/L (ref 0–44)
AST: 20 U/L (ref 15–41)
Albumin: 3.9 g/dL (ref 3.5–5.0)
Alkaline Phosphatase: 35 U/L — ABNORMAL LOW (ref 38–126)
Anion gap: 4 — ABNORMAL LOW (ref 5–15)
BUN: 20 mg/dL (ref 8–23)
CO2: 25 mmol/L (ref 22–32)
Calcium: 8.9 mg/dL (ref 8.9–10.3)
Chloride: 105 mmol/L (ref 98–111)
Creatinine, Ser: 0.87 mg/dL (ref 0.44–1.00)
GFR, Estimated: >60 mL/min (ref >60)
Glucose, Bld: 130 mg/dL — ABNORMAL HIGH (ref 70–99)
Potassium: 3.8 mmol/L (ref 3.5–5.1)
Sodium: 134 mmol/L — ABNORMAL LOW (ref 135–145)
Total Bilirubin: 0.8 mg/dL (ref 0.3–1.2)
Total Protein: 6.9 g/dL (ref 6.5–8.1)

## 2022-06-18 LAB — BRAIN NATRIURETIC PEPTIDE: B Natriuretic Peptide: 112.2 pg/mL — ABNORMAL HIGH (ref 0.0–100.0)

## 2022-06-18 NOTE — Discharge Instructions (Signed)
Please elevate your leg as often as possible until fluid has resolved.  Take the torsemide as prescribed.  Do not take the furosemide while taking the torsemide.  Monitor closely for any sign of infection.  If you do have concerns, please see primary care or return to the emergency department.

## 2022-06-18 NOTE — ED Provider Triage Note (Signed)
Emergency Medicine Provider Triage Evaluation Note  AVANA KREISER , a 88 y.o. female  was evaluated in triage.  Pt complains of swelling to the right leg.  Had a fall on July 4.  Has had x-ray and ultrasound on the same day.  Continues to have more swelling feels like a large hematoma above the knee..  Review of Systems  Positive: See above Negative: Fever chills  Physical Exam  BP 139/68 (BP Location: Left Arm)   Pulse 97   Temp 98.4 F (36.9 C) (Oral)   Resp 18   Ht 5' (1.524 m)   Wt 70.3 kg   SpO2 97%   BMI 30.27 kg/m  Gen:   Awake, no distress   Resp:  Normal effort  MSK:   Large amount of swelling in the right leg, both legs are holding fluid, tenderness above the knee which may be a hematoma Other:    Medical Decision Making  Medically screening exam initiated at 2:20 PM.  Appropriate orders placed.  LUCETTA BAEHR was informed that the remainder of the evaluation will be completed by another provider, this initial triage assessment does not replace that evaluation, and the importance of remaining in the ED until their evaluation is complete.  Labs and imaging ordered   Versie Starks, PA-C 06/18/22 1420

## 2022-06-18 NOTE — ED Triage Notes (Signed)
Pt was sent from urgent care for the increased swelling ot her right leg with blisters and oozing, pt had an u/s on July 4th and no dvt was noted, pt denies chf but takes lasix prescribed by her pulmonologist

## 2022-06-18 NOTE — ED Notes (Signed)
Patient declined discharge vital signs. 

## 2022-06-18 NOTE — ED Provider Notes (Signed)
[facility] Provider Note    Event Date/Time   First MD Initiated Contact with Patient 06/18/22 1636     (approximate)   History   Leg Swelling   HPI  Amanda Dalton is a 88 y.o. female with history of osteoporosis, asthma, A-fib and as listed below presents to the emergency department for treatment and evaluation of right lower extremity swelling.  She had a nonsyncopal fall in late June and sustained an injury to the right knee.  Knee and lower extremity began to swell soon afterward.  She had a negative ultrasound for DVT on June 01, 2022.  Swelling has not improved and she now has reddened and blistered areas over the surface of her skin. She had an appointment with her pulmonologist yesterday and was prescribed torsemide.  She read the potential side effects and chose not to take it.  She was evaluated by [facility] today and sent to the ER for further evaluation.   Past Medical History:  Diagnosis Date   Anxiety    Asthma    Atrial flutter ([place])    a. Dx 11/17/2016-->CHA2DS2VASc = 3-->Eliquis 5 mg BID.   Breast cancer ([place])    a. 1996 s/p R modified radical mastectomy-->chemo w/ tamoxifen.   Endometrial cancer ([place])    a. 08/2001 s/p hysterectomy. No node involvement.   Fracture of right humerus 11/2008   Hypothyroidism    Osteoporosis    Squamous cell carcinoma of skin 01/29/2010   Left superior side. KA, WD SCC not entirely excluded.     Physical Exam   Triage Vital Signs: ED Triage Vitals  Enc Vitals Group     BP 06/18/22 1416 139/68     Pulse Rate 06/18/22 1416 97     Resp 06/18/22 1416 18     Temp 06/18/22 1416 98.4 F (36.9 C)     Temp Source 06/18/22 1416 Oral     SpO2 06/18/22 1416 97 %     Weight 06/18/22 1419 155 lb (70.3 kg)     Height 06/18/22 1419 5' (1.524 m)     Head Circumference --      Peak Flow --      Pain Score 06/18/22 1428 0     Pain Loc --      Pain Edu? --      Excl. in [place]? --     Most recent vital  signs: Vitals:   06/18/22 1416  BP: 139/68  Pulse: 97  Resp: 18  Temp: 98.4 F (36.9 C)  SpO2: 97%    General: Awake, no distress.  CV:  Good peripheral perfusion.  Resp:  Normal effort.  Abd:  No distention.  Other:  3+pitting edema of right lower extremity from knee to foot with scattered red raised areas with clear blisters. No appearance of cellulitis.   ED Results / Procedures / Treatments   Labs (all labs ordered are listed, but only abnormal results are displayed) Labs Reviewed  COMPREHENSIVE METABOLIC PANEL - Abnormal; Notable for the following components:      Result Value   Sodium 134 (*)    Glucose, Bld 130 (*)    Alkaline Phosphatase 35 (*)    Anion gap 4 (*)    All other components within normal limits  BRAIN NATRIURETIC PEPTIDE - Abnormal; Notable for the following components:   B Natriuretic Peptide 112.2 (*)    All other components within normal limits  CBC WITH DIFFERENTIAL/PLATELET - Abnormal; Notable  for the following components:   RBC 3.62 (*)    Hemoglobin 11.3 (*)    HCT 34.9 (*)    All other components within normal limits     EKG  Not indicated.   RADIOLOGY  Image of the right knee negative for acute concerns.   [place] of RLE negative for DVT.  I have independently reviewed and interpreted imaging as well as reviewed report from radiology.  PROCEDURES:  Critical Care performed: No  Procedures   MEDICATIONS ORDERED IN ED:  Medications - No data to display   IMPRESSION / MDM / [place] / ED COURSE   I reviewed the triage vital signs and the nursing notes.  Differential diagnosis includes, but is not limited to: DVT, cellulitis, peripheral edema secondary to trauma.  Patient's presentation is most consistent with acute complicated illness / injury requiring diagnostic workup.  88 year old female presenting to the emergency department for treatment and evaluation of continued edema to the right lower extremity.  See  HPI for further details.  On exam, edema extends from knee to toes.  She does have some clear fluid-filled blisters with surrounding erythema but no surrounding evidence of cellulitis.  Lab studies and imaging results were reviewed with the patient and husband.  We discussed the torsemide that was prescribed by pulmonology.  She feels reassured that the side effects listed are more related to chronic/daily use of this medication.  She also states that she has furosemide at home, but the number of milligrams is significantly less.  She did take 1 furosemide a couple of days ago but did not see any decrease in the swelling.  Plan will be for her to take the 7 days of torsemide as prescribed.  She was advised not to take furosemide in addition to that torsemide.  Patient states that she understands this.  We also discussed having her elevate her leg as often as possible throughout the next few days.  She has not been doing this and has continued to perform all ADLs.  She will call on Monday to schedule follow-up appointment with her primary care provider for next week.  Over the weekend, if her symptoms change or worsen she was advised to return to the emergency department.     FINAL CLINICAL IMPRESSION(S) / ED DIAGNOSES   Final diagnoses:  Peripheral edema     Rx / DC Orders   ED Discharge Orders     None        Note:  This document was prepared using Dragon voice recognition software and may include unintentional dictation errors.   Victorino Dike, FNP 06/18/22 1848    Lucrezia Starch, MD 06/18/22 1901

## 2022-07-05 ENCOUNTER — Telehealth: Payer: Self-pay | Admitting: Cardiovascular Disease

## 2022-07-05 NOTE — Telephone Encounter (Signed)
Pt c/o medication issue:  1. Name of Medication:   apixaban (ELIQUIS) 2.5 MG TABS tablet    2. How are you currently taking this medication (dosage and times per day)?   Take 1 tablet by mouth twice daily    3. Are you having a reaction (difficulty breathing--STAT)? No  4. What is your medication issue? Pt states that she had a fall back in June and has been having leg swelling since. Pt states that another provider told her that the swelling may be due to medication. Pt would like a callback. Please advise

## 2022-07-06 NOTE — Telephone Encounter (Signed)
Pt c/o swelling: STAT is pt has developed SOB within 24 hours  If swelling, where is the swelling located? Right leg  How much weight have you gained and in what time span?   Yes  Have you gained 3 pounds in a day or 5 pounds in a week?   No  Do you have a log of your daily weights (if so, list)?  No  Are you currently taking a fluid pill? Yes  Are you currently SOB?  No  Have you traveled recently?  No  Patient stated that she fell on 6/27 and injured her right leg which became swollen.  Patient is concerned about the swelling, fluid in her leg from her knee down to her foot and wants to know what can be done to relieve the swelling as she has had oozing in the leg. Patient called yesterday as well and stated she did not get a call back.

## 2022-07-06 NOTE — Telephone Encounter (Signed)
I do not think the swelling is directly from the Eliquis. What she might be referring to is some blood in her knee that they cannot drain bc she is on Eliquis and might have bleed more easily with the trama she suffered because she is on Eliquis.

## 2022-07-06 NOTE — Telephone Encounter (Signed)
Spoke with the patient. Adv the patient of our PharmDs response and recommendation. Adv the patient that Eliquis is unlikely the cause of her persistent LE swelling.  Patient complains of right leg swelling. She denies bilateral LE edema, weight gain, sob.    Patient fell and and injured her right knee 6 weeks ago. DVT and fracture were ruled out.  Patient sts that she is still having swelling in her right knee. She was seen by ortho several weeks ago. She was given 7 days of diuretic and knee high compression stockings.  She did take the diuretic for 7 days, but has not been wearing the compression stocking. Adv the patient to wear the compression stocking daily remove at bedtime. Adv the pt to elevate her leg as much as possible through the day. Adv the patient that compliance is important and resolution of the swelling may take time.  Patient will call the office of Friday to provide an update.  Patient voiced appreciation for the call back.

## 2022-07-09 ENCOUNTER — Telehealth: Payer: Self-pay | Admitting: Cardiovascular Disease

## 2022-07-09 NOTE — Telephone Encounter (Signed)
Patient is calling requesting callback from [place] around 2:00 pm regarding the previous conversation she had with Lattie Haw.

## 2022-07-09 NOTE — Telephone Encounter (Signed)
Called patient to see how her swelling is doing. No answer. Lmtcb.

## 2022-07-09 NOTE — Telephone Encounter (Signed)
Returned patient's call.   Pt reports that she's going to have an MRI next Wednesday to look at her knee further and see if they can figure out what's causing the swelling.   Pt states that she will let us know if she needs anything.

## 2022-07-09 NOTE — Telephone Encounter (Signed)
Please see phone encounter from 8/7. Closing this encounter.

## 2022-08-05 NOTE — Progress Notes (Signed)
Cardiology Office Note  Date:  08/06/2022   ID:  Amanda Dalton, DOB Apr 07, 1931, MRN [id number]  PCP:  [place] Pink, MD   Chief Complaint  Patient presents with   12 month follow up     Patient c/o bilateral LE edema & shortness of breath with over exertion. Medications reviewed by the patient verbally.     HPI:  88 y/o woman with a h/o  hypothyroidism,  anxiety,  asthma,  atrial flutter rate 120 bpm, started on anticoagulation,  previous episode of atrial flutter presented in the setting of attending a party 10/2016: fib flutter 11/2016: NSR Atrial fibrillation 5/19 again with spontaneous conversion. Cardioversion cancelled 04/19/2018 cardioversion 2/21; 3/21  who presents for  follow-up of her arrhythmia, atrial fibrillation  LOV 01/2021 June 2023, had a mechincal fall at home, hurt knee Significant bruising, blistering sores with swelling Was [facility]  Seen by pulmonary 06/17/22, was given torsemide for leg swelling Took torsemide for one week Mild imporvement in leg swelling No DVT on u/s Not on torsemide currently Has not been taking Lasix but has this on her list to take as needed for leg swelling, weight gain  No sx from atrial fib Weight is 10 pounds higher compared to last year  Continues to have ventral hernia, no significant pain Sleep reported periodic N/V, ABD bloating  EKG personally reviewed by myself on todays visit Atrial fibrillation rate 83 bpm no significant ST-T wave changes  Other past significant history  01/2021, was in atrial fibrillation,  Does not take atenolol, amiodarone given  bradycardia  Prior imaging reviewed with her in detail, CT scan, 01/2021 Large hiatal hernia. Ventral wall hernia in the upper abdomen containing portions of the small bowel without evidence for an obstruction. Rectosigmoid diverticulosis without acute inflammation. Kidney cyst aortic atherosclerosis   Prior history of GI bleeds, none  recently,  Cardioversion cancelled 04/19/2018 Was in NSR on amiodarone and atenolol on arrival to the hospital She had bradycardia and amiodarone decreased down to 100 mg daily atenolol down to 12.5 mg daily  Previously received adenosine IV push for tachycardia revealing flutter waves CHA2DS2VASc of 3 (age/age/gender), started on eliquis 5 mg BID (creat 0.8 12/4, wt > 60 kg)  Echocardiogram 12/08/2016 Ejection fraction 50-55%, moderately elevated right heart pressures 50-55 mmHg  Had a fall Nov 2017: large laceration on forehead   PMH:   has a past medical history of Anxiety, Asthma, Atrial flutter ([place]), Breast cancer ([place]), Endometrial cancer ([place]), Fracture of right humerus (11/2008), Hypothyroidism, Osteoporosis, and Squamous cell carcinoma of skin (01/29/2010).  PSH:    Past Surgical History:  Procedure Laterality Date   CARDIOVERSION N/A 04/19/2018   Procedure: CARDIOVERSION;  Surgeon: Minna Merritts, MD;  Location: [facility] ORS;  Service: Cardiovascular;  Laterality: N/A;   CARDIOVERSION N/A 01/18/2020   Procedure: CARDIOVERSION;  Surgeon: Minna Merritts, MD;  Location: [facility] ORS;  Service: Cardiovascular;  Laterality: N/A;   Right Mastectomy  1996   VAGINAL HYSTERECTOMY  08/2001    Current Outpatient Medications  Medication Sig Dispense Refill   albuterol (VENTOLIN HFA) 108 (90 Base) MCG/ACT inhaler Inhale 1 puff into the lungs every 4 (four) hours as needed.      apixaban (ELIQUIS) 2.5 MG TABS tablet Take 1 tablet by mouth twice daily 60 tablet 5   ascorbic acid (VITAMIN C) 500 MG tablet Take 500 mg by mouth daily in the afternoon. Most days     atenolol (TENORMIN) 25 MG tablet  Take 0.5 tablets (12.5 mg total) by mouth daily as needed. 90 tablet 3   Calcium Carb-Cholecalciferol (CALCIUM-VITAMIN D) 600-400 MG-UNIT TABS Take 1 tablet by mouth daily at 10 pm.      cetirizine (ZYRTEC) 10 MG tablet Take 10 mg by mouth daily at 10 pm.      Cholecalciferol (D3 SUPER STRENGTH) 50  MCG (2000 UT) CAPS Take 2,000 Units by mouth daily in the afternoon.     citalopram (CELEXA) 40 MG tablet Take 40 mg by mouth daily at 10 pm.      furosemide (LASIX) 20 MG tablet Take 1 tablet (20 mg total) by mouth daily as needed for edema. 90 tablet 3   ipratropium (ATROVENT) 0.06 % nasal spray Place 2 sprays into both nostrils 3 (three) times daily.     levalbuterol (XOPENEX HFA) 45 MCG/ACT inhaler Inhale into the lungs.     levothyroxine (SYNTHROID, LEVOTHROID) 75 MCG tablet Take 75 mcg by mouth daily at 10 pm.      montelukast (SINGULAIR) 10 MG tablet Take 10 mg by mouth daily at 10 pm.      Multiple Vitamin (MULTIVITAMIN WITH MINERALS) TABS tablet Take 1 tablet by mouth daily.     ADVAIR HFA 45-21 MCG/ACT inhaler Inhale into the lungs.     hyoscyamine (LEVSIN SL) 0.125 MG SL tablet Place under the tongue every 4 (four) hours as needed. (Patient not taking: Reported on 08/06/2022)     No current facility-administered medications for this visit.    Allergies:   Paxil [paroxetine hcl]   Social History:  The patient  reports that she has never smoked. She has never used smokeless tobacco. She reports current alcohol use of about 1.0 standard drink of alcohol per week. She reports that she does not use drugs.   Family History:   family history includes Breast cancer in her sister; Hypertension in her mother; Osteoporosis in her sister; Stroke in her father and mother.    Review of Systems: Review of Systems  Constitutional: Negative.   HENT: Negative.    Respiratory: Negative.    Cardiovascular:  Positive for leg swelling.  Gastrointestinal: Negative.   Musculoskeletal: Negative.        Gait instability  Neurological: Negative.   Psychiatric/Behavioral: Negative.    All other systems reviewed and are negative.   PHYSICAL EXAM: VS:  BP (!) 140/90 (BP Location: Left Arm, Patient Position: Sitting, Cuff Size: Normal)   Pulse 83   Ht 5' (1.524 m)   Wt 155 lb 8 oz (70.5 kg)   SpO2  97%   BMI 30.37 kg/m  , BMI Body mass index is 30.37 kg/m.  Constitutional:  oriented to person, place, and time. No distress.  HENT:  Head: Grossly normal Eyes:  no discharge. No scleral icterus.  Neck: No JVD, no carotid bruits  Cardiovascular: Irregularly irregular,, no murmurs appreciated Pulmonary/Chest: Clear to auscultation bilaterally, no wheezes or rails Abdominal: Soft.  no distension.  no tenderness.  Musculoskeletal: Normal range of motion Neurological:  normal muscle tone. Coordination normal. No atrophy Skin: Skin warm and dry Psychiatric: normal affect, pleasant   Recent Labs: 06/18/2022: ALT 15; B Natriuretic Peptide 112.2; BUN 20; Creatinine, Ser 0.87; Hemoglobin 11.3; Platelets 336; Potassium 3.8; Sodium 134    Lipid Panel No results found for: "CHOL", "HDL", "LDLCALC", "TRIG"    Wt Readings from Last 3 Encounters:  08/06/22 155 lb 8 oz (70.5 kg)  06/18/22 155 lb (70.3 kg)  03/11/22 150 lb (  68 kg)     ASSESSMENT AND PLAN:  Persistent atrial fibrillation History of sick sinus syndrome Permanent atrial fib On atenolol 12.5 prn for rate on Eliquis 5 BID Lasix PRN for unilateral ankle swelling, take sparingly  Leg swelling Component of dependent edema of right leg Rec mended compression hose  Hernia Still has it, Previously seen by surgery, no sx  Anxiety Managed by primary care stable  Hypothyroidism, unspecified type On thyroid supplement, stable  Hyperlipidemia Cholesterol 220 Does not want cholesterol medication  Uncomplicated asthma, unspecified asthma severity, unspecified whether persistent No recent issues, stable Followed by pulmonary  Gait instability/risk of falls Recent fall recommended regular walking program   Total encounter time more than 30 minutes  Greater than 50% was spent in counseling and coordination of care with the patient   Orders Placed This Encounter  Procedures   EKG 12-Lead     Signed, Esmond Plants, M.D., Ph.D. 08/06/2022  [place], [place]

## 2022-08-06 ENCOUNTER — Ambulatory Visit: Payer: Medicare Other | Attending: Cardiovascular Disease | Admitting: Cardiovascular Disease

## 2022-08-06 ENCOUNTER — Encounter: Payer: Self-pay | Admitting: Cardiovascular Disease

## 2022-08-06 VITALS — BP 140/90 | HR 83 | Ht 60.0 in | Wt 155.5 lb

## 2022-08-06 DIAGNOSIS — I4892 Unspecified atrial flutter: Secondary | ICD-10-CM | POA: Insufficient documentation

## 2022-08-06 DIAGNOSIS — R001 Bradycardia, unspecified: Secondary | ICD-10-CM | POA: Diagnosis not present

## 2022-08-06 DIAGNOSIS — E782 Mixed hyperlipidemia: Secondary | ICD-10-CM | POA: Insufficient documentation

## 2022-08-06 DIAGNOSIS — M353 Polymyalgia rheumatica: Secondary | ICD-10-CM | POA: Diagnosis not present

## 2022-08-06 DIAGNOSIS — R0609 Other forms of dyspnea: Secondary | ICD-10-CM | POA: Insufficient documentation

## 2022-08-06 DIAGNOSIS — I4819 Other persistent atrial fibrillation: Secondary | ICD-10-CM | POA: Diagnosis not present

## 2022-08-06 NOTE — Patient Instructions (Addendum)
Compression hose, especially right leg  Medication Instructions:  No changes  If you need a refill on your cardiac medications before your next appointment, please call your pharmacy.   Lab work: No new labs needed  Testing/Procedures: No new testing needed  Follow-Up: At [facility], you and your health needs are our priority.  As part of our continuing mission to provide you with exceptional heart care, we have created designated Provider Care Teams.  These Care Teams include your primary Cardiologist (physician) and Advanced Practice Providers (APPs -  Physician Assistants and Nurse Practitioners) who all work together to provide you with the care you need, when you need it.  You will need a follow up appointment in 6 months  Providers on your designated Care Team:   Murray Hodgkins, NP Christell Faith, PA-C Cadence Kathlen Mody, [place]  COVID-19 Vaccine Information can be found at: [email or web address] For questions related to vaccine distribution or appointments, please email [email or web address] or call [phone].

## 2022-10-02 ENCOUNTER — Other Ambulatory Visit: Payer: Self-pay | Admitting: Internal Medicine

## 2022-10-02 DIAGNOSIS — I4819 Other persistent atrial fibrillation: Secondary | ICD-10-CM

## 2022-10-04 NOTE — Telephone Encounter (Signed)
Prescription refill request for Eliquis received. Indication:afib Last office visit:9/23 Scr:0.7 Age: 88 Weight:70.5 kg  Prescription refilled

## 2022-11-11 ENCOUNTER — Encounter: Payer: Self-pay | Admitting: Dermatology

## 2022-11-11 ENCOUNTER — Ambulatory Visit (INDEPENDENT_AMBULATORY_CARE_PROVIDER_SITE_OTHER): Payer: Medicare Other | Admitting: Dermatology

## 2022-11-11 VITALS — BP 134/88 | HR 83

## 2022-11-11 DIAGNOSIS — L820 Inflamed seborrheic keratosis: Secondary | ICD-10-CM | POA: Diagnosis not present

## 2022-11-11 DIAGNOSIS — Z85828 Personal history of other malignant neoplasm of skin: Secondary | ICD-10-CM

## 2022-11-11 DIAGNOSIS — D229 Melanocytic nevi, unspecified: Secondary | ICD-10-CM

## 2022-11-11 DIAGNOSIS — Z8589 Personal history of malignant neoplasm of other organs and systems: Secondary | ICD-10-CM

## 2022-11-11 DIAGNOSIS — L814 Other melanin hyperpigmentation: Secondary | ICD-10-CM | POA: Diagnosis not present

## 2022-11-11 DIAGNOSIS — Z1283 Encounter for screening for malignant neoplasm of skin: Secondary | ICD-10-CM | POA: Diagnosis not present

## 2022-11-11 DIAGNOSIS — L578 Other skin changes due to chronic exposure to nonionizing radiation: Secondary | ICD-10-CM

## 2022-11-11 DIAGNOSIS — L821 Other seborrheic keratosis: Secondary | ICD-10-CM

## 2022-11-11 DIAGNOSIS — Z853 Personal history of malignant neoplasm of breast: Secondary | ICD-10-CM

## 2022-11-11 NOTE — Progress Notes (Signed)
Follow-Up Visit   Subjective  Amanda Dalton is a 88 y.o. female who presents for the following: Annual Exam (1 yr tbse, hx of isk, hx of scc, patient is concerned about a spot on left cheek. /). The patient presents for Total-Body Skin Exam (TBSE) for skin cancer screening and mole check.  The patient has spots, moles and lesions to be evaluated, some may be new or changing and the patient has concerns that these could be cancer.  The following portions of the chart were reviewed this encounter and updated as appropriate:  Tobacco  Allergies  Meds  Problems  Med Hx  Surg Hx  Fam Hx     Review of Systems: No other skin or systemic complaints except as noted in HPI or Assessment and Plan.  Objective  Well appearing patient in no apparent distress; mood and affect are within normal limits.  A full examination was performed including scalp, head, eyes, ears, nose, lips, neck, chest, axillae, abdomen, back, buttocks, bilateral upper extremities, bilateral lower extremities, hands, feet, fingers, toes, fingernails, and toenails. All findings within normal limits unless otherwise noted below.  left cheek x 1 Erythematous stuck-on, waxy papule or plaque   Assessment & Plan  Inflamed seborrheic keratosis left cheek x 1  Symptomatic, irritating, patient would like treated.  Destruction of lesion - left cheek x 1 Complexity: simple   Destruction method: cryotherapy   Informed consent: discussed and consent obtained   Timeout:  patient name, date of birth, surgical site, and procedure verified Lesion destroyed using liquid nitrogen: Yes   Region frozen until ice ball extended beyond lesion: Yes   Outcome: patient tolerated procedure well with no complications   Post-procedure details: wound care instructions given   Additional details:  Prior to procedure, discussed risks of blister formation, small wound, skin dyspigmentation, or rare scar following cryotherapy. Recommend Vaseline  ointment to treated areas while healing.   Skin cancer screening  Actinic skin damage  Lentigo  Melanocytic nevus, unspecified location  Seborrheic keratosis  History of squamous cell carcinoma  Lentigines - Scattered tan macules - Due to sun exposure - Benign-appearing, observe - Recommend daily broad spectrum sunscreen SPF 30+ to sun-exposed areas, reapply every 2 hours as needed. - Call for any changes  Seborrheic Keratoses - Stuck-on, waxy, tan-brown papules and/or plaques  - Benign-appearing - Discussed benign etiology and prognosis. - Observe - Call for any changes  Melanocytic Nevi - Tan-brown and/or pink-flesh-colored symmetric macules and papules - Benign appearing on exam today - Observation - Call clinic for new or changing moles - Recommend daily use of broad spectrum spf 30+ sunscreen to sun-exposed areas.   Hemangiomas - Red papules - Discussed benign nature - Observe - Call for any changes  Actinic Damage - Chronic condition, secondary to cumulative UV/sun exposure - diffuse scaly erythematous macules with underlying dyspigmentation - Recommend daily broad spectrum sunscreen SPF 30+ to sun-exposed areas, reapply every 2 hours as needed.  - Staying in the shade or wearing long sleeves, sun glasses (UVA+UVB protection) and wide brim hats (4-inch brim around the entire circumference of the hat) are also recommended for sun protection.  - Call for new or changing lesions.  History of Squamous Cell Carcinoma of the Skin Left superior side 01/2010 - No evidence of recurrence today - No lymphadenopathy - Recommend regular full body skin exams - Recommend daily broad spectrum sunscreen SPF 30+ to sun-exposed areas, reapply every 2 hours as needed.  - Call  if any new or changing lesions are noted between office visits  Hx of breast cancer Right Breast no evidence of recurrence, no lymphadenopathy.   Skin cancer screening performed today. Return in  about 1 year (around 11/12/2023) for TBSE. IRuthell Rummage, CMA, am acting as scribe for Sarina Ser, MD. Documentation: I have reviewed the above documentation for accuracy and completeness, and I agree with the above.  Sarina Ser, MD

## 2022-11-11 NOTE — Patient Instructions (Addendum)
Seborrheic Keratosis  What causes seborrheic keratoses? Seborrheic keratoses are harmless, common skin growths that first appear during adult life.  As time goes by, more growths appear.  Some people may develop a large number of them.  Seborrheic keratoses appear on both covered and uncovered body parts.  They are not caused by sunlight.  The tendency to develop seborrheic keratoses can be inherited.  They vary in color from skin-colored to gray, brown, or even black.  They can be either smooth or have a rough, warty surface.   Seborrheic keratoses are superficial and look as if they were stuck on the skin.  Under the microscope this type of keratosis looks like layers upon layers of skin.  That is why at times the top layer may seem to fall off, but the rest of the growth remains and re-grows.    Treatment Seborrheic keratoses do not need to be treated, but can easily be removed in the office.  Seborrheic keratoses often cause symptoms when they rub on clothing or jewelry.  Lesions can be in the way of shaving.  If they become inflamed, they can cause itching, soreness, or burning.  Removal of a seborrheic keratosis can be accomplished by freezing, burning, or surgery. If any spot bleeds, scabs, or grows rapidly, please return to have it checked, as these can be an indication of a skin cancer.  Cryotherapy Aftercare  Wash gently with soap and water everyday.   Apply Vaseline and Band-Aid daily until healed.       Melanoma ABCDEs  Melanoma is the most dangerous type of skin cancer, and is the leading cause of death from skin disease.  You are more likely to develop melanoma if you: Have light-colored skin, light-colored eyes, or red or blond hair Spend a lot of time in the sun Tan regularly, either outdoors or in a tanning bed Have had blistering sunburns, especially during childhood Have a close family member who has had a melanoma Have atypical moles or large birthmarks  Early  detection of melanoma is key since treatment is typically straightforward and cure rates are extremely high if we catch it early.   The first sign of melanoma is often a change in a mole or a new dark spot.  The ABCDE system is a way of remembering the signs of melanoma.  A for asymmetry:  The two halves do not match. B for border:  The edges of the growth are irregular. C for color:  A mixture of colors are present instead of an even brown color. D for diameter:  Melanomas are usually (but not always) greater than 6mm - the size of a pencil eraser. E for evolution:  The spot keeps changing in size, shape, and color.  Please check your skin once per month between visits. You can use a small mirror in front and a large mirror behind you to keep an eye on the back side or your body.   If you see any new or changing lesions before your next follow-up, please call to schedule a visit.  Please continue daily skin protection including broad spectrum sunscreen SPF 30+ to sun-exposed areas, reapplying every 2 hours as needed when you're outdoors.   Staying in the shade or wearing long sleeves, sun glasses (UVA+UVB protection) and wide brim hats (4-inch brim around the entire circumference of the hat) are also recommended for sun protection.     Due to recent changes in healthcare laws, you may see results of   your pathology and/or laboratory studies on MyChart before the doctors have had a chance to review them. We understand that in some cases there may be results that are confusing or concerning to you. Please understand that not all results are received at the same time and often the doctors may need to interpret multiple results in order to provide you with the best plan of care or course of treatment. Therefore, we ask that you please give us 2 business days to thoroughly review all your results before contacting the office for clarification. Should we see a critical lab result, you will be contacted  sooner.   If You Need Anything After Your Visit  If you have any questions or concerns for your doctor, please call our main line at [phone] and press option 4 to reach your doctor's medical assistant. If no one answers, please leave a voicemail as directed and we will return your call as soon as possible. Messages left after 4 pm will be answered the following business day.   You may also send us a message via MyChart. We typically respond to MyChart messages within 1-2 business days.  For prescription refills, please ask your pharmacy to contact our office. Our fax number is [phone].  If you have an urgent issue when the clinic is closed that cannot wait until the next business day, you can page your doctor at the number below.    Please note that while we do our best to be available for urgent issues outside of office hours, we are not available 24/7.   If you have an urgent issue and are unable to reach us, you may choose to seek medical care at your doctor's office, retail clinic, urgent care center, or emergency room.  If you have a medical emergency, please immediately call 911 or go to the emergency department.  Pager Numbers  - Dr. Kowalski: [phone]  - Dr. Moye: [phone]  - Dr. Stewart: [phone]  In the event of inclement weather, please call our main line at [phone] for an update on the status of any delays or closures.  Dermatology Medication Tips: Please keep the boxes that topical medications come in in order to help keep track of the instructions about where and how to use these. Pharmacies typically print the medication instructions only on the boxes and not directly on the medication tubes.   If your medication is too expensive, please contact our office at [phone] option 4 or send us a message through MyChart.   We are unable to tell what your co-pay for medications will be in advance as this is different depending on your insurance  coverage. However, we may be able to find a substitute medication at lower cost or fill out paperwork to get insurance to cover a needed medication.   If a prior authorization is required to get your medication covered by your insurance company, please allow us 1-2 business days to complete this process.  Drug prices often vary depending on where the prescription is filled and some pharmacies may offer cheaper prices.  The website www.goodrx.com contains coupons for medications through different pharmacies. The prices here do not account for what the cost may be with help from insurance (it may be cheaper with your insurance), but the website can give you the price if you did not use any insurance.  - You can print the associated coupon and take it with your prescription to the pharmacy.  - You may also stop   by our office during regular business hours and pick up a GoodRx coupon card.  - If you need your prescription sent electronically to a different pharmacy, notify our office through [place] MyChart or by phone at [phone] option 4.     Si Usted Necesita Algo Despus de Su Visita  Tambin puede enviarnos un mensaje a travs de MyChart. Por lo general respondemos a los mensajes de MyChart en el transcurso de 1 a 2 das hbiles.  Para renovar recetas, por favor pida a su farmacia que se ponga en contacto con nuestra oficina. Nuestro nmero de fax es el [phone].  Si tiene un asunto urgente cuando la clnica est cerrada y que no puede esperar hasta el siguiente da hbil, puede llamar/localizar a su doctor(a) al nmero que aparece a continuacin.   Por favor, tenga en cuenta que aunque hacemos todo lo posible para estar disponibles para asuntos urgentes fuera del horario de oficina, no estamos disponibles las 24 horas del da, los 7 das de la semana.   Si tiene un problema urgente y no puede comunicarse con nosotros, puede optar por buscar atencin mdica  en el consultorio de  su doctor(a), en una clnica privada, en un centro de atencin urgente o en una sala de emergencias.  Si tiene una emergencia mdica, por favor llame inmediatamente al 911 o vaya a la sala de emergencias.  Nmeros de bper  - Dr. Kowalski: [phone]  - Dra. Moye: [phone]  - Dra. Stewart: [phone]  En caso de inclemencias del tiempo, por favor llame a nuestra lnea principal al [phone] para una actualizacin sobre el estado de cualquier retraso o cierre.  Consejos para la medicacin en dermatologa: Por favor, guarde las cajas en las que vienen los medicamentos de uso tpico para ayudarle a seguir las instrucciones sobre dnde y cmo usarlos. Las farmacias generalmente imprimen las instrucciones del medicamento slo en las cajas y no directamente en los tubos del medicamento.   Si su medicamento es muy caro, por favor, pngase en contacto con nuestra oficina llamando al [phone] y presione la opcin 4 o envenos un mensaje a travs de MyChart.   No podemos decirle cul ser su copago por los medicamentos por adelantado ya que esto es diferente dependiendo de la cobertura de su seguro. Sin embargo, es posible que podamos encontrar un medicamento sustituto a menor costo o llenar un formulario para que el seguro cubra el medicamento que se considera necesario.   Si se requiere una autorizacin previa para que su compaa de seguros cubra su medicamento, por favor permtanos de 1 a 2 das hbiles para completar este proceso.  Los precios de los medicamentos varan con frecuencia dependiendo del lugar de dnde se surte la receta y alguna farmacias pueden ofrecer precios ms baratos.  El sitio web www.goodrx.com tiene cupones para medicamentos de diferentes farmacias. Los precios aqu no tienen en cuenta lo que podra costar con la ayuda del seguro (puede ser ms barato con su seguro), pero el sitio web puede darle el precio si no utiliz ningn seguro.  - Puede imprimir el  cupn correspondiente y llevarlo con su receta a la farmacia.  - Tambin puede pasar por nuestra oficina durante el horario de atencin regular y recoger una tarjeta de cupones de GoodRx.  - Si necesita que su receta se enve electrnicamente a una farmacia diferente, informe a nuestra oficina a travs de MyChart de [place] o por telfono llamando al [phone] y presione la opcin 4.  

## 2022-11-25 ENCOUNTER — Encounter: Payer: Self-pay | Admitting: Dermatology

## 2023-01-12 ENCOUNTER — Encounter: Payer: Self-pay | Admitting: Internal Medicine

## 2023-01-12 ENCOUNTER — Inpatient Hospital Stay: Payer: Medicare Other

## 2023-01-12 ENCOUNTER — Emergency Department: Payer: Medicare Other

## 2023-01-12 ENCOUNTER — Inpatient Hospital Stay
Admission: EM | Admit: 2023-01-12 | Discharge: 2023-01-15 | DRG: 394 | Disposition: A | Discharge status: Nursing Home (Includes ICF) | Payer: Medicare Other | Source: Skilled Nursing Facility | Attending: Student | Admitting: Student

## 2023-01-12 ENCOUNTER — Other Ambulatory Visit: Payer: Self-pay

## 2023-01-12 DIAGNOSIS — Z853 Personal history of malignant neoplasm of breast: Secondary | ICD-10-CM

## 2023-01-12 DIAGNOSIS — F32A Depression, unspecified: Secondary | ICD-10-CM | POA: Diagnosis present

## 2023-01-12 DIAGNOSIS — Z8262 Family history of osteoporosis: Secondary | ICD-10-CM

## 2023-01-12 DIAGNOSIS — J4540 Moderate persistent asthma, uncomplicated: Secondary | ICD-10-CM | POA: Diagnosis present

## 2023-01-12 DIAGNOSIS — Z9011 Acquired absence of right breast and nipple: Secondary | ICD-10-CM

## 2023-01-12 DIAGNOSIS — Z8542 Personal history of malignant neoplasm of other parts of uterus: Secondary | ICD-10-CM

## 2023-01-12 DIAGNOSIS — Z7951 Long term (current) use of inhaled steroids: Secondary | ICD-10-CM

## 2023-01-12 DIAGNOSIS — I4819 Other persistent atrial fibrillation: Secondary | ICD-10-CM | POA: Diagnosis present

## 2023-01-12 DIAGNOSIS — Z85828 Personal history of other malignant neoplasm of skin: Secondary | ICD-10-CM

## 2023-01-12 DIAGNOSIS — Z803 Family history of malignant neoplasm of breast: Secondary | ICD-10-CM | POA: Diagnosis not present

## 2023-01-12 DIAGNOSIS — I4892 Unspecified atrial flutter: Secondary | ICD-10-CM | POA: Diagnosis present

## 2023-01-12 DIAGNOSIS — E871 Hypo-osmolality and hyponatremia: Secondary | ICD-10-CM | POA: Diagnosis present

## 2023-01-12 DIAGNOSIS — M810 Age-related osteoporosis without current pathological fracture: Secondary | ICD-10-CM | POA: Diagnosis present

## 2023-01-12 DIAGNOSIS — E876 Hypokalemia: Secondary | ICD-10-CM | POA: Diagnosis present

## 2023-01-12 DIAGNOSIS — K56609 Unspecified intestinal obstruction, unspecified as to partial versus complete obstruction: Secondary | ICD-10-CM | POA: Diagnosis present

## 2023-01-12 DIAGNOSIS — F419 Anxiety disorder, unspecified: Secondary | ICD-10-CM | POA: Diagnosis present

## 2023-01-12 DIAGNOSIS — Z823 Family history of stroke: Secondary | ICD-10-CM | POA: Diagnosis not present

## 2023-01-12 DIAGNOSIS — Z79899 Other long term (current) drug therapy: Secondary | ICD-10-CM

## 2023-01-12 DIAGNOSIS — Z888 Allergy status to other drugs, medicaments and biological substances status: Secondary | ICD-10-CM | POA: Diagnosis not present

## 2023-01-12 DIAGNOSIS — Z7901 Long term (current) use of anticoagulants: Secondary | ICD-10-CM | POA: Diagnosis not present

## 2023-01-12 DIAGNOSIS — Z8249 Family history of ischemic heart disease and other diseases of the circulatory system: Secondary | ICD-10-CM | POA: Diagnosis not present

## 2023-01-12 DIAGNOSIS — Z79890 Hormone replacement therapy (postmenopausal): Secondary | ICD-10-CM

## 2023-01-12 DIAGNOSIS — K436 Other and unspecified ventral hernia with obstruction, without gangrene: Secondary | ICD-10-CM | POA: Diagnosis present

## 2023-01-12 DIAGNOSIS — E639 Nutritional deficiency, unspecified: Secondary | ICD-10-CM | POA: Diagnosis present

## 2023-01-12 DIAGNOSIS — J45909 Unspecified asthma, uncomplicated: Secondary | ICD-10-CM | POA: Diagnosis present

## 2023-01-12 DIAGNOSIS — E039 Hypothyroidism, unspecified: Secondary | ICD-10-CM | POA: Diagnosis present

## 2023-01-12 LAB — COMPREHENSIVE METABOLIC PANEL
ALT: 14 U/L (ref 0–44)
AST: 23 U/L (ref 15–41)
Albumin: 4.2 g/dL (ref 3.5–5.0)
Alkaline Phosphatase: 41 U/L (ref 38–126)
Anion gap: 13 (ref 5–15)
BUN: 21 mg/dL (ref 8–23)
CO2: 21 mmol/L — ABNORMAL LOW (ref 22–32)
Calcium: 9.4 mg/dL (ref 8.9–10.3)
Chloride: 99 mmol/L (ref 98–111)
Creatinine, Ser: 0.75 mg/dL (ref 0.44–1.00)
GFR, Estimated: >60 mL/min (ref >60)
Glucose, Bld: 167 mg/dL — ABNORMAL HIGH (ref 70–99)
Potassium: 3.6 mmol/L (ref 3.5–5.1)
Sodium: 133 mmol/L — ABNORMAL LOW (ref 135–145)
Total Bilirubin: 1.5 mg/dL — ABNORMAL HIGH (ref 0.3–1.2)
Total Protein: 7.6 g/dL (ref 6.5–8.1)

## 2023-01-12 LAB — CBC WITH DIFFERENTIAL/PLATELET
Abs Immature Granulocytes: 0.03 10*3/uL (ref 0.00–0.07)
Basophils Absolute: 0 10*3/uL (ref 0.0–0.1)
Basophils Relative: 0 %
Eosinophils Absolute: 0 10*3/uL (ref 0.0–0.5)
Eosinophils Relative: 0 %
HCT: 41.8 % (ref 36.0–46.0)
Hemoglobin: 14 g/dL (ref 12.0–15.0)
Immature Granulocytes: 0 %
Lymphocytes Relative: 12 %
Lymphs Abs: 1.2 10*3/uL (ref 0.7–4.0)
MCH: 30.3 pg (ref 26.0–34.0)
MCHC: 33.5 g/dL (ref 30.0–36.0)
MCV: 90.5 fL (ref 80.0–100.0)
Monocytes Absolute: 0.9 10*3/uL (ref 0.1–1.0)
Monocytes Relative: 9 %
Neutro Abs: 8.5 10*3/uL — ABNORMAL HIGH (ref 1.7–7.7)
Neutrophils Relative %: 79 %
Platelets: 297 10*3/uL (ref 150–400)
RBC: 4.62 MIL/uL (ref 3.87–5.11)
RDW: 13.3 % (ref 11.5–15.5)
WBC: 10.7 10*3/uL — ABNORMAL HIGH (ref 4.0–10.5)
nRBC: 0 % (ref 0.0–0.2)

## 2023-01-12 LAB — LIPASE, BLOOD: Lipase: 28 U/L (ref 11–51)

## 2023-01-12 LAB — MAGNESIUM: Magnesium: 1.9 mg/dL (ref 1.7–2.4)

## 2023-01-12 MED ORDER — DILTIAZEM HCL-DEXTROSE 125-5 MG/125ML-% IV SOLN (PREMIX)
5.0000 mg/h | INTRAVENOUS | Status: DC
  Administered 2023-01-12: 5 mg/h via INTRAVENOUS
  Administered 2023-01-13: 12.5 mg/h via INTRAVENOUS
  Administered 2023-01-13: 15 mg/h via INTRAVENOUS
  Administered 2023-01-14: 12.5 mg/h via INTRAVENOUS
  Filled 2023-01-12 (×5): qty 125

## 2023-01-12 MED ORDER — POTASSIUM CHLORIDE 10 MEQ/100ML IV SOLN
10.0000 meq | INTRAVENOUS | Status: AC
  Administered 2023-01-12 (×3): 10 meq via INTRAVENOUS
  Filled 2023-01-12 (×3): qty 100

## 2023-01-12 MED ORDER — LACTATED RINGERS IV BOLUS
500.0000 mL | Freq: Once | INTRAVENOUS | Status: AC
  Administered 2023-01-12: 500 mL via INTRAVENOUS

## 2023-01-12 MED ORDER — POLYETHYLENE GLYCOL 3350 17 G PO PACK: 17.0000 g | PACK | Freq: Every day | ORAL | Status: DC | PRN

## 2023-01-12 MED ORDER — ACETAMINOPHEN 325 MG PO TABS
650.0000 mg | ORAL_TABLET | Freq: Four times a day (QID) | ORAL | Status: DC | PRN
  Administered 2023-01-15: 650 mg
  Filled 2023-01-12: qty 2

## 2023-01-12 MED ORDER — LACTATED RINGERS IV SOLN: INTRAVENOUS | Status: DC

## 2023-01-12 MED ORDER — SODIUM CHLORIDE 0.9% FLUSH
3.0000 mL | Freq: Two times a day (BID) | INTRAVENOUS | Status: DC
  Administered 2023-01-13 – 2023-01-15 (×3): 3 mL via INTRAVENOUS

## 2023-01-12 MED ORDER — IOHEXOL 300 MG/ML  SOLN
100.0000 mL | Freq: Once | INTRAMUSCULAR | Status: AC | PRN
  Administered 2023-01-12: 100 mL via INTRAVENOUS

## 2023-01-12 MED ORDER — ENOXAPARIN SODIUM 40 MG/0.4ML IJ SOSY
40.0000 mg | PREFILLED_SYRINGE | INTRAMUSCULAR | Status: DC
  Administered 2023-01-12: 40 mg via SUBCUTANEOUS
  Filled 2023-01-12: qty 0.4

## 2023-01-12 MED ORDER — ACETAMINOPHEN 650 MG RE SUPP: 650.0000 mg | Freq: Four times a day (QID) | RECTAL | Status: DC | PRN

## 2023-01-12 MED ORDER — FLUTICASONE FUROATE-VILANTEROL 100-25 MCG/ACT IN AEPB
1.0000 | INHALATION_SPRAY | Freq: Every day | RESPIRATORY_TRACT | Status: DC
  Administered 2023-01-14 – 2023-01-15 (×2): 1 via RESPIRATORY_TRACT
  Filled 2023-01-12 (×2): qty 28

## 2023-01-12 MED ORDER — ONDANSETRON HCL 4 MG PO TABS: 4.0000 mg | ORAL_TABLET | Freq: Four times a day (QID) | ORAL | Status: DC | PRN

## 2023-01-12 MED ORDER — BUDESONIDE 0.5 MG/2ML IN SUSP: 0.5000 mg | Freq: Every day | RESPIRATORY_TRACT | Status: DC

## 2023-01-12 MED ORDER — SODIUM CHLORIDE 0.9 % IV BOLUS
500.0000 mL | Freq: Once | INTRAVENOUS | Status: AC
Start: 2023-01-12 — End: 2023-01-12
  Administered 2023-01-12: 500 mL via INTRAVENOUS

## 2023-01-12 MED ORDER — ONDANSETRON HCL 4 MG/2ML IJ SOLN
4.0000 mg | Freq: Once | INTRAMUSCULAR | Status: AC
Start: 2023-01-12 — End: 2023-01-12
  Administered 2023-01-12: 4 mg via INTRAVENOUS
  Filled 2023-01-12: qty 2

## 2023-01-12 MED ORDER — PHENOL 1.4 % MT LIQD
1.0000 | OROMUCOSAL | Status: DC | PRN
  Filled 2023-01-12: qty 177

## 2023-01-12 MED ORDER — ONDANSETRON HCL 4 MG/2ML IJ SOLN
4.0000 mg | Freq: Four times a day (QID) | INTRAMUSCULAR | Status: DC | PRN
  Filled 2023-01-12: qty 2

## 2023-01-12 NOTE — Consult Note (Signed)
SURGICAL CONSULTATION NOTE   HISTORY OF PRESENT ILLNESS (HPI):  89 y.o. female presented to [facility] [place] ED for evaluation of vomiting. Patient reports she has been vomiting since yesterday.  She endorses lower abdominal pain.  No pain radiation.  She cannot identify any alleviating or aggravating factors.  She endorses that she had a ventral hernia for few years.  She endorses that the hernia is getting better.  She denies any pain directly on the hernia.  At the ED she had reducible ventral hernia.  No significant tenderness on the abdomen.  Labs shows mild leukocytosis of 10.7.  Normal hemoglobin.  Normal platelets.  There is normal renal function.  She had a CT scan of the abdomen pelvis that shows small bowel obstruction at the site of ventral hernia.  I personally provided the images.  No pneumatosis, free air or free fluid.  Surgery is consulted by Dr. Charleen Kirks in this context for evaluation and management of small bowel obstruction.  PAST MEDICAL HISTORY (PMH):  Past Medical History:  Diagnosis Date   Anxiety    Asthma    Atrial flutter ([place])    a. Dx 11/17/2016-->CHA2DS2VASc = 3-->Eliquis 5 mg BID.   Breast cancer ([place])    a. 1996 s/p R modified radical mastectomy-->chemo w/ tamoxifen.   Endometrial cancer ([place])    a. 08/2001 s/p hysterectomy. No node involvement.   Fracture of right humerus 11/2008   Hypothyroidism    Osteoporosis    Squamous cell carcinoma of skin 01/29/2010   Left superior side. KA, WD SCC not entirely excluded.     PAST SURGICAL HISTORY ([place]):  Past Surgical History:  Procedure Laterality Date   CARDIOVERSION N/A 04/19/2018   Procedure: CARDIOVERSION;  Surgeon: Minna Merritts, MD;  Location: [place] [facility];  Service: Cardiovascular;  Laterality: N/A;   CARDIOVERSION N/A 01/18/2020   Procedure: CARDIOVERSION;  Surgeon: Minna Merritts, MD;  Location: [facility];  Service: Cardiovascular;  Laterality: N/A;   Right Mastectomy  [place]  08/2001      MEDICATIONS:  Prior to Admission medications   Medication Sig Start Date End Date Taking? Authorizing Provider  ADVAIR HFA [phone] MCG/ACT inhaler Inhale into the lungs. 06/21/22   [provider]  albuterol (VENTOLIN HFA) 108 (90 Base) MCG/ACT inhaler Inhale 1 puff into the lungs every 4 (four) hours as needed.  06/30/20   [provider]  apixaban Arne Cleveland) 2.5 MG TABS tablet Take 1 tablet by mouth twice daily 10/04/22   Deboraha Sprang, MD  ascorbic acid (VITAMIN C) 500 MG tablet Take 500 mg by mouth daily in the afternoon. Most days    [provider]  atenolol (TENORMIN) 25 MG tablet Take 0.5 tablets (12.5 mg total) by mouth daily as needed. 08/05/21   Minna Merritts, MD  Calcium Carb-Cholecalciferol (CALCIUM-VITAMIN D) 600-400 MG-UNIT TABS Take 1 tablet by mouth daily at 10 pm.     [provider]  cetirizine (ZYRTEC) 10 MG tablet Take 10 mg by mouth daily at 10 pm.     [provider]  citalopram (CELEXA) 40 MG tablet Take 40 mg by mouth daily at 10 pm.     [provider]  furosemide (LASIX) 20 MG tablet Take 1 tablet (20 mg total) by mouth daily as needed for edema. 08/05/21   Minna Merritts, MD  hyoscyamine (LEVSIN SL) 0.125 MG SL tablet Place under the tongue every 4 (four) hours as needed. 04/07/21   [provider]  ipratropium (ATROVENT) 0.06 % nasal spray Place 2 sprays into both nostrils 3 (three) times daily. 01/14/22   [provider]  levalbuterol Penne Lash HFA) 45 MCG/ACT inhaler Inhale into the lungs.    [provider]  levothyroxine (SYNTHROID, LEVOTHROID) 75 MCG tablet Take 75 mcg by mouth daily at 10 pm.     [provider]  montelukast (SINGULAIR) 10 MG tablet Take 10 mg by mouth daily at 10 pm.     [provider]  Multiple Vitamin (MULTIVITAMIN WITH MINERALS) TABS tablet Take 1 tablet by mouth daily.    [provider]     ALLERGIES:  Allergies  Allergen Reactions    Paxil [Paroxetine Hcl] Nausea Only     SOCIAL HISTORY:  Social History   Socioeconomic History   Marital status: Married    Spouse name: Not on file   Number of children: Not on file   Years of education: Not on file   Highest education level: Not on file  Occupational History   Occupation: Retired  Tobacco Use   Smoking status: Never   Smokeless tobacco: Never  Vaping Use   Vaping Use: Never used  Substance and Sexual Activity   Alcohol use: Yes    Alcohol/week: 1.0 standard drink of alcohol    Types: 1 Glasses of wine per week    Comment: per day   Drug use: No   Sexual activity: Never  Other Topics Concern   Not on file  Social History Narrative   Lives locally with husband - Health and safety inspector   Social Determinants of Radio broadcast assistant Strain: Not on file  Food Insecurity: Not on file  Transportation Needs: Not on file  Physical Activity: Not on file  Stress: Not on file  Social Connections: Not on file  Intimate Partner Violence: Not on file      FAMILY HISTORY:  Family History  Problem Relation Age of Onset   Breast cancer Sister    Osteoporosis Sister    Stroke Mother        died @ 82   Hypertension Mother    Stroke Father        died @ 37     REVIEW OF SYSTEMS:  Constitutional: denies weight loss, fever, chills, or sweats  Eyes: denies any other vision changes, history of eye injury  ENT: denies sore throat, hearing problems  Respiratory: denies shortness of breath, wheezing  Cardiovascular: denies chest pain, palpitations  Gastrointestinal: Positive abdominal pain, nausea and vomiting Genitourinary: denies burning with urination or urinary frequency Musculoskeletal: denies any other joint pains or cramps  Skin: denies any other rashes or skin discolorations  Neurological: denies any other headache, dizziness, weakness  Psychiatric: denies any other depression, anxiety   All other review of systems were negative   VITAL SIGNS:  Temp:   [98.1 F (36.7 C)] 98.1 F (36.7 C) (02/14 1254) Pulse Rate:  [83] 83 (02/14 1254) Resp:  [19] 19 (02/14 1254) BP: (168)/(96) 168/96 (02/14 1254) SpO2:  [95 %] 95 % (02/14 1254) Weight:  [68.5 kg] 68.5 kg (02/14 1255)     Height: 5' (152.4 cm) Weight: 68.5 kg BMI (Calculated): 29.49   INTAKE/OUTPUT:  This shift: No intake/output data recorded.  Last 2 shifts: @IOLAST2SHIFTS$ @   PHYSICAL EXAM:  Constitutional:  -- Normal body habitus  -- Awake, alert, and oriented x3  Eyes:  -- Pupils equally round and reactive to light  -- No scleral icterus  Ear, nose, and throat:  -- No jugular venous distension  Pulmonary:  -- No crackles  -- Equal breath sounds bilaterally -- Breathing non-labored at rest Cardiovascular:  -- S1, S2 present  -- No pericardial rubs Gastrointestinal:  -- Abdomen soft, nontender, non-distended, no guarding or rebound tenderness -- Moderate-sized ventral hernia, I was able to reduce the hernia at bedside.  Nontender to palpation.  Discomfort upon reduction. Musculoskeletal and Integumentary:  -- Wounds: None appreciated -- Extremities: B/L UE and LE FROM, hands and feet warm, no edema  Neurologic:  -- Motor function: intact and symmetric -- Sensation: intact and symmetric   Labs:     Latest Ref Rng & Units 01/12/2023   12:56 PM 06/18/2022    2:21 PM 01/03/2020   11:06 AM  CBC  WBC 4.0 - 10.5 K/uL 10.7  7.2  7.5   Hemoglobin 12.0 - 15.0 g/dL 14.0  11.3  14.0   Hematocrit 36.0 - 46.0 % 41.8  34.9  41.0   Platelets 150 - 400 K/uL 297  336  272       Latest Ref Rng & Units 01/12/2023   12:56 PM 06/18/2022    2:21 PM 01/03/2020   11:06 AM  CMP  Glucose 70 - 99 mg/dL 167  130  101   BUN 8 - 23 mg/dL 21  20  19   $ Creatinine 0.44 - 1.00 mg/dL 0.75  0.87  0.86   Sodium 135 - 145 mmol/L 133  134  136   Potassium 3.5 - 5.1 mmol/L 3.6  3.8  4.7   Chloride 98 - 111 mmol/L 99  105  98   CO2 22 - 32 mmol/L 21  25  25   $ Calcium 8.9 - 10.3 mg/dL 9.4  8.9  9.9    Total Protein 6.5 - 8.1 g/dL 7.6  6.9    Total Bilirubin 0.3 - 1.2 mg/dL 1.5  0.8    Alkaline Phos 38 - 126 U/L 41  35    AST 15 - 41 U/L 23  20    ALT 0 - 44 U/L 14  15      Imaging studies:  EXAM: CT ABDOMEN AND PELVIS WITH CONTRAST   TECHNIQUE: Multidetector CT imaging of the abdomen and pelvis was performed using the standard protocol following bolus administration of intravenous contrast.   RADIATION DOSE REDUCTION: This exam was performed according to the departmental dose-optimization program which includes automated exposure control, adjustment of the mA and/or kV according to patient size and/or use of iterative reconstruction technique.   CONTRAST:  167m OMNIPAQUE IOHEXOL 300 MG/ML  SOLN   COMPARISON:  01/27/2021   FINDINGS: Lower chest: Chronic cardiomegaly. Chronic hiatal hernia. Chronic volume loss in the medial right middle lobe.   Hepatobiliary: Liver parenchyma is normal.  No calcified gallstones.   Pancreas: Normal   Spleen: No significant finding. Chronic benign peripheral calcification.   Adrenals/Urinary Tract: Adrenal glands are normal. Simple appearing cysts of the kidneys. No follow-up recommended. No hydronephrosis. No stone or mass. Bladder shows a shallow diverticulum but is otherwise normal.   Stomach/Bowel: Increase in size of an upper abdominal ventral hernia. The defect itself now measures 3.8 cm in diameter and there is more herniated mesentery, herniated small bowel and herniated transverse colon. There is acute small bowel obstruction due to this hernia. The distal small bowel is collapsed. The colon is not obstructed.   Vascular/Lymphatic: Aortic atherosclerosis. No aneurysm. IVC is normal. Retroperitoneal surgical clips. No  adenopathy.   Reproductive: Previous hysterectomy.  No pelvic mass.   Other: No free fluid or air.   Musculoskeletal: Chronic lumbar degenerative changes.   IMPRESSION: 1. Acute small bowel obstruction  due to an upper abdominal ventral hernia. Herniated small bowel and herniated transverse colon. The hernia is larger than was seen in 2022. The colon is not obstructed. 2. Chronic hiatal hernia. 3. Aortic atherosclerosis. 4. Chronic volume loss in the medial right middle lobe. 5. Chronic lumbar degenerative changes.   Aortic Atherosclerosis (ICD10-I70.0).     Electronically Signed   By: Nelson Chimes M.D.   On: 01/12/2023 15:19  Assessment/Plan:  89 y.o. female with small bowel obstruction due to ventral hernia, complicated by pertinent comorbidities including A-fib on anticoagulation, COPD.  Patient with CT scan showing small bowel obstruction with transition point at the ventral hernia.  I was able to reduce the hernia.  No sign of strangulation.  I had a long discussion with the patient and the husband about the what this means to have this hernia with this current episode of bowel obstruction.  They were very insistent to proceed with any surgical intervention.  I discussed with patient that at this moment there is no evidence urgent need for any surgical intervention but I do recommend to have his hernia fixed because there is a high risk of recurrence of this bowel obstruction.  As soon as I reduce the hernia it immediately came back up.  I think that the obstruction itself can be resolved with decompression of the nasogastric tube and frequent reduction of the hernia but once we remove the NG if she continue having the small intestine going through the hernia there is a high risk for obstruction recurrence.  At this moment again since there is no sign of strangulation and the hernia reducible I agree with conservative management.  Patient and husband requested time to think about all this discussion.  I personally placed the NGT.  I placed a 16 French nasogastric tube through the left nostril.  The was advanced to the stomach.  Abdominal x-ray shows NGT coiled in the herniated stomach.  No  complication from procedure.  I agree with hospitalist to hold Eliquis in case they decided to proceed with surgery.  Continue IV hydration.  Keep electrolyte within normal limits.  Encourage patient to ambulate.   Arnold Long, MD

## 2023-01-12 NOTE — ED Provider Notes (Signed)
[facility] Provider Note    Event Date/Time   First MD Initiated Contact with Patient 01/12/23 1326     (approximate)   History    HPI  Amanda Dalton is a 89 y.o. female with history of atrial fibrillation, asthma, and osteoporosis who presents with nausea and vomiting since yesterday afternoon, acute onset after drinking some Coca-Cola.  The patient reports lower abdominal pain but states that this is subacute to chronic and is minimal right now.  She also has a large ventral hernia which has been present for a few years.  She does not feel that it is significantly swollen from baseline.  She had several bowel movements last night which were normal in consistency.  She has not been passing much gas today.  She denies any urinary symptoms.  I reviewed the past medical records.  The patient's most recent documented outpatient encounter was with Dr. Nehemiah Massed from dermatology on 12/14 of last year for routine follow-up, and on 9/8 last year with Dr. Rockey Situ from cardiology for follow-up of her atrial fib/flutter.  The patient is on Eliquis.   Physical Exam   Triage Vital Signs: ED Triage Vitals  Enc Vitals Group     BP 01/12/23 1254 (!) 168/96     Pulse Rate 01/12/23 1254 83     Resp 01/12/23 1254 19     Temp 01/12/23 1254 98.1 F (36.7 C)     Temp Source 01/12/23 1254 Oral     SpO2 01/12/23 1254 95 %     Weight 01/12/23 1255 151 lb (68.5 kg)     Height 01/12/23 1255 5' (1.524 m)     Head Circumference --      Peak Flow --      Pain Score 01/12/23 1254 3     Pain Loc --      Pain Edu? --      Excl. in [place]? --     Most recent vital signs: Vitals:   01/12/23 1254  BP: (!) 168/96  Pulse: 83  Resp: 19  Temp: 98.1 F (36.7 C)  SpO2: 95%     General: Awake, no distress.  CV:  Good peripheral perfusion.  Resp:  Normal effort.  Abd:  Soft with no focal tenderness.  Distended upper abdominal ventral hernia which is easily reducible with no  tenderness. Other:  No jaundice or scleral icterus.   ED Results / Procedures / Treatments   Labs (all labs ordered are listed, but only abnormal results are displayed) Labs Reviewed  CBC WITH DIFFERENTIAL/PLATELET - Abnormal; Notable for the following components:      Result Value   WBC 10.7 (*)    Neutro Abs 8.5 (*)    All other components within normal limits  COMPREHENSIVE METABOLIC PANEL - Abnormal; Notable for the following components:   Sodium 133 (*)    CO2 21 (*)    Glucose, Bld 167 (*)    Total Bilirubin 1.5 (*)    All other components within normal limits  LIPASE, BLOOD  URINALYSIS, ROUTINE W REFLEX MICROSCOPIC     EKG  ED ECG REPORT I, Arta Silence, the attending physician, personally viewed and interpreted this ECG.  Date: 01/12/2023 EKG Time: 1437 Rate: 91 Rhythm: normal sinus rhythm QRS Axis: normal Intervals: normal ST/T Wave abnormalities: normal Narrative Interpretation: no evidence of acute ischemia    RADIOLOGY  CT abdomen/pelvis: I independently viewed and interpreted the images; there are multiple dilated bowel loops  but no free air or free fluid.  Radiology report indicates acute small bowel obstruction.  PROCEDURES:  Critical Care performed: No  Procedures   MEDICATIONS ORDERED IN ED: Medications  sodium chloride 0.9 % bolus 500 mL (500 mLs Intravenous New Bag/Given 01/12/23 1443)  ondansetron (ZOFRAN) injection 4 mg (4 mg Intravenous Given 01/12/23 1443)  iohexol (OMNIPAQUE) 300 MG/ML solution 100 mL (100 mLs Intravenous Contrast Given 01/12/23 1458)     IMPRESSION / MDM / ASSESSMENT AND PLAN / ED COURSE  I reviewed the triage vital signs and the nursing notes.  89 year old female with PMH as noted above presents with acute onset of vomiting since last night with some abdominal pain although the abdominal pain appears to be more subacute.  She has a large ventral hernia on exam which is soft and nontender.  There is no other  significant abdominal tenderness.  Vital signs are normal except for hypertension.  Differential diagnosis includes, but is not limited to, gastritis, PUD, gastroenteritis, bowel obstruction, colitis, diverticulitis, UTI, less likely cardiac etiology.  We will obtain lab workup, CT abdomen/pelvis, and reassess.  Patient's presentation is most consistent with acute presentation with potential threat to life or bodily function.  ----------------------------------------- 3:26 PM on 01/12/2023 -----------------------------------------  Lab workup reveals normal electrolytes, minimally elevated bilirubin, and minimal leukocytosis at 10.7.  Lipase is normal.  CT shows findings consistent with small bowel obstruction.  I have paged general surgery.  ----------------------------------------- 3:51 PM on 01/12/2023 -----------------------------------------  I consulted Dr. Peyton Najjar from general surgery and discussed the case with him; he is evaluating the patient.  He recommends an NG tube.  I then contacted Dr. Charleen Kirks from the hospitalist service; based on her discussion she agrees to admit the patient.   FINAL CLINICAL IMPRESSION(S) / ED DIAGNOSES   Final diagnoses:  Small bowel obstruction ([place])     Rx / DC Orders   ED Discharge Orders     None        Note:  This document was prepared using Dragon voice recognition software and may include unintentional dictation errors.    Arta Silence, MD 01/12/23 [phone]

## 2023-01-12 NOTE — H&P (Signed)
History and Physical    Patient: Amanda Dalton [id number] DOB: [birth year] DOA: 01/12/2023 DOS: the patient was seen and examined on 01/12/2023 PCP: [place] Pink, MD  Patient coming from: Home  Chief Complaint:  Chief Complaint  Patient presents with   Vomiting    Patient presents to triage actively vomiting; She states that she has had intermittent lower abdominal pain for "a long time" (she says she has been to several doctors for the abdominal pain); This episode of abdominal pain began yesterday then she began vomiting later on that day; Denies diarrhea   HPI: Amanda Dalton is a 89 y.o. female with medical history significant of atrial flutter on AC, osteoporosis, hypothyroidism, previous breast and endometrial cancer, moderate persistent asthma, who presents to the ED due to vomiting.  Amanda Dalton states that yesterday evening, she had sudden onset nausea and vomiting that has persisted throughout the night without any alleviation.  Her emesis has been nonbloody, nonmelanotic.  She notes that she has a chronic hernia that occurred after having a hysterectomy in 2002 but she has not had any complications from it, and so her surgeons did not recommend repair.  She notes chronic lower abdominal pain of which etiology was never discovered but that has not been bothering her recently.  She did have a bowel movement yesterday that appeared normal.  She notes that she drinks Benefiber daily in her coffee.  She denies any prior history of SBO.  ED course: On arrival to the ED, patient was hypertensive at 168/96 with heart rate of 83.  She was saturating at 95% on room air. Initial workup remarkable for WBC of 10.7, sodium of 133, bicarb of 21, glucose of 167, creatinine 0.75 with GFR above 60.  CT of the abdomen demonstrated acute small bowel obstruction due to upper abdominal ventral hernia.  General surgery consulted.  TRH contacted for admission  Review of Systems: As mentioned in the history  of present illness. All other systems reviewed and are negative.  Past Medical History:  Diagnosis Date   Anxiety    Asthma    Atrial flutter ([place])    a. Dx 11/17/2016-->CHA2DS2VASc = 3-->Eliquis 5 mg BID.   Breast cancer ([place])    a. 1996 s/p R modified radical mastectomy-->chemo w/ tamoxifen.   Endometrial cancer ([place])    a. 08/2001 s/p hysterectomy. No node involvement.   Fracture of right humerus 11/2008   Hypothyroidism    Osteoporosis    Squamous cell carcinoma of skin 01/29/2010   Left superior side. KA, WD SCC not entirely excluded.   Past Surgical History:  Procedure Laterality Date   CARDIOVERSION N/A 04/19/2018   Procedure: CARDIOVERSION;  Surgeon: Minna Merritts, MD;  Location: [facility];  Service: Cardiovascular;  Laterality: N/A;   CARDIOVERSION N/A 01/18/2020   Procedure: CARDIOVERSION;  Surgeon: Minna Merritts, MD;  Location: [facility];  Service: Cardiovascular;  Laterality: N/A;   Right Mastectomy  [place]  08/2001   Social History:  reports that she has never smoked. She has never used smokeless tobacco. She reports current alcohol use of about 1.0 standard drink of alcohol per week. She reports that she does not use drugs.  Allergies  Allergen Reactions   Paxil [Paroxetine Hcl] Nausea Only    Family History  Problem Relation Age of Onset   Breast cancer Sister    Osteoporosis Sister    Stroke Mother        died @ 31  Hypertension Mother    Stroke Father        died @ 75    Prior to Admission medications   Medication Sig Start Date End Date Taking? Authorizing Provider  ADVAIR HFA [phone] MCG/ACT inhaler Inhale into the lungs. 06/21/22   [provider]  albuterol (VENTOLIN HFA) 108 (90 Base) MCG/ACT inhaler Inhale 1 puff into the lungs every 4 (four) hours as needed.  06/30/20   [provider]  apixaban Arne Cleveland) 2.5 MG TABS tablet Take 1 tablet by mouth twice daily 10/04/22   Deboraha Sprang, MD  ascorbic acid  (VITAMIN C) 500 MG tablet Take 500 mg by mouth daily in the afternoon. Most days    [provider]  atenolol (TENORMIN) 25 MG tablet Take 0.5 tablets (12.5 mg total) by mouth daily as needed. 08/05/21   Minna Merritts, MD  Calcium Carb-Cholecalciferol (CALCIUM-VITAMIN D) 600-400 MG-UNIT TABS Take 1 tablet by mouth daily at 10 pm.     [provider]  cetirizine (ZYRTEC) 10 MG tablet Take 10 mg by mouth daily at 10 pm.     [provider]  citalopram (CELEXA) 40 MG tablet Take 40 mg by mouth daily at 10 pm.     [provider]  furosemide (LASIX) 20 MG tablet Take 1 tablet (20 mg total) by mouth daily as needed for edema. 08/05/21   Minna Merritts, MD  hyoscyamine (LEVSIN SL) 0.125 MG SL tablet Place under the tongue every 4 (four) hours as needed. 04/07/21   [provider]  ipratropium (ATROVENT) 0.06 % nasal spray Place 2 sprays into both nostrils 3 (three) times daily. 01/14/22   [provider]  levalbuterol Penne Lash HFA) 45 MCG/ACT inhaler Inhale into the lungs.    [provider]  levothyroxine (SYNTHROID, LEVOTHROID) 75 MCG tablet Take 75 mcg by mouth daily at 10 pm.     [provider]  montelukast (SINGULAIR) 10 MG tablet Take 10 mg by mouth daily at 10 pm.     [provider]  Multiple Vitamin (MULTIVITAMIN WITH MINERALS) TABS tablet Take 1 tablet by mouth daily.    [provider]    Physical Exam: Vitals:   01/12/23 1254 01/12/23 1255  BP: (!) 168/96   Pulse: 83   Resp: 19   Temp: 98.1 F (36.7 C)   TempSrc: Oral   SpO2: 95%   Weight:  68.5 kg  Height:  5' (1.524 m)   Physical Exam Vitals and nursing note reviewed.  Constitutional:      General: She is not in acute distress.    Appearance: She is normal weight. She is not toxic-appearing.  HENT:     Head: Normocephalic and atraumatic.     Mouth/Throat:     Mouth: Mucous membranes are moist.     Pharynx: Oropharynx is clear.   Eyes:     Conjunctiva/sclera: Conjunctivae normal.     Pupils: Pupils are equal, round, and reactive to light.  Cardiovascular:     Rate and Rhythm: Normal rate and regular rhythm.     Heart sounds: No murmur heard. Pulmonary:     Effort: Pulmonary effort is normal. No respiratory distress.     Breath sounds: Normal breath sounds. No wheezing, rhonchi or rales.  Abdominal:     Palpations: Abdomen is soft.     Tenderness: There is no abdominal tenderness. There is no guarding.     Hernia: A hernia is present. Hernia is present  in the ventral area (Large ventral hernia noticed, nontender to palpation).  Musculoskeletal:     Right lower leg: No edema.     Left lower leg: No edema.  Skin:    General: Skin is warm and dry.  Neurological:     General: No focal deficit present.     Mental Status: She is alert and oriented to person, place, and time. Mental status is at baseline.  Psychiatric:        Mood and Affect: Mood normal.        Behavior: Behavior normal.    Data Reviewed: CBC with WBC of 10.7, hemoglobin of 14, platelets of 297 CMP with sodium of 133, potassium 3.6, bicarb of 21, glucose of 167, creatinine 0.75, BUN 21, AST 23, ALT 14, total bilirubin of 1.5 and GFR above 60  EKG personally reviewed.  Sinus rhythm with rate of 91.  Multiple PACs noted.  CT ABDOMEN PELVIS W CONTRAST  Result Date: 01/12/2023 CLINICAL DATA:  Vomiting. Bowel obstruction suspected. Abdominal pain. EXAM: CT ABDOMEN AND PELVIS WITH CONTRAST TECHNIQUE: Multidetector CT imaging of the abdomen and pelvis was performed using the standard protocol following bolus administration of intravenous contrast. RADIATION DOSE REDUCTION: This exam was performed according to the departmental dose-optimization program which includes automated exposure control, adjustment of the mA and/or kV according to patient size and/or use of iterative reconstruction technique. CONTRAST:  129m OMNIPAQUE IOHEXOL 300 MG/ML  SOLN  COMPARISON:  01/27/2021 FINDINGS: Lower chest: Chronic cardiomegaly. Chronic hiatal hernia. Chronic volume loss in the medial right middle lobe. Hepatobiliary: Liver parenchyma is normal.  No calcified gallstones. Pancreas: Normal Spleen: No significant finding. Chronic benign peripheral calcification. Adrenals/Urinary Tract: Adrenal glands are normal. Simple appearing cysts of the kidneys. No follow-up recommended. No hydronephrosis. No stone or mass. Bladder shows a shallow diverticulum but is otherwise normal. Stomach/Bowel: Increase in size of an upper abdominal ventral hernia. The defect itself now measures 3.8 cm in diameter and there is more herniated mesentery, herniated small bowel and herniated transverse colon. There is acute small bowel obstruction due to this hernia. The distal small bowel is collapsed. The colon is not obstructed. Vascular/Lymphatic: Aortic atherosclerosis. No aneurysm. IVC is normal. Retroperitoneal surgical clips. No adenopathy. Reproductive: Previous hysterectomy.  No pelvic mass. Other: No free fluid or air. Musculoskeletal: Chronic lumbar degenerative changes. IMPRESSION: 1. Acute small bowel obstruction due to an upper abdominal ventral hernia. Herniated small bowel and herniated transverse colon. The hernia is larger than was seen in 2022. The colon is not obstructed. 2. Chronic hiatal hernia. 3. Aortic atherosclerosis. 4. Chronic volume loss in the medial right middle lobe. 5. Chronic lumbar degenerative changes. Aortic Atherosclerosis (ICD10-I70.0). Electronically Signed   By: MNelson ChimesM.D.   On: 01/12/2023 15:19    There are no new results to review at this time.  Assessment and Plan:  * SBO (small bowel obstruction) (HCC) Patient presenting with 1 day history of vomiting in the setting of a known ventral hernia and prior abdominal surgery including hysterectomy.  - General surgery consulted; appreciate their recommendations - NG tube placed; awaiting  confirmatory KUB - When able, place NG tube to low intermittent suction - Start maintenance IV fluids - 40 mEq IV of potassium ordered - Magnesium level pending - Maintain K above 4 and Mg above 2  Persistent atrial fibrillation (HCC) - Holding home Eliquis in the setting of SBO and uncertain timing of potential surgery  Hypothyroidism - Hold home Synthroid given n.p.o.  status  Uncomplicated asthma No wheezing on examination today.  - Continue home bronchodilators - Hold home montelukast in the setting of n.p.o. status  Advance Care Planning:   Code Status: Full Code.  When discussing CODE STATUS, patient stated that she does not know which she would want.  Due to this, will remain as full code for now.  Consults: General Surgery \ Family Communication: Patient's husband updated at bedside  Severity of Illness: The appropriate patient status for this patient is INPATIENT. Inpatient status is judged to be reasonable and necessary in order to provide the required intensity of service to ensure the patient's safety. The patient's presenting symptoms, physical exam findings, and initial radiographic and laboratory data in the context of their chronic comorbidities is felt to place them at high risk for further clinical deterioration. Furthermore, it is not anticipated that the patient will be medically stable for discharge from the hospital within 2 midnights of admission.   * I certify that at the point of admission it is my clinical judgment that the patient will require inpatient hospital care spanning beyond 2 midnights from the point of admission due to high intensity of service, high risk for further deterioration and high frequency of surveillance required.*  Author: Jose Persia, MD 01/12/2023 4:44 PM  For on call review [email or web address].

## 2023-01-12 NOTE — ED Triage Notes (Signed)
Patient presents to triage actively vomiting; She states that she has had intermittent lower abdominal pain for "a long time" (she says she has been to several doctors for the abdominal pain); This episode of abdominal pain began yesterday then she began vomiting later on that day; Denies diarrhea

## 2023-01-12 NOTE — Assessment & Plan Note (Signed)
Patient presenting with 1 day history of vomiting in the setting of a known ventral hernia and prior abdominal surgery including hysterectomy.  - General surgery consulted; appreciate their recommendations - NG tube placed; awaiting confirmatory KUB - When able, place NG tube to low intermittent suction - Start maintenance IV fluids - 40 mEq IV of potassium ordered - Magnesium level pending - Maintain K above 4 and Mg above 2

## 2023-01-12 NOTE — Assessment & Plan Note (Signed)
-   Holding home Eliquis in the setting of SBO and uncertain timing of potential surgery

## 2023-01-12 NOTE — Progress Notes (Signed)
  Interval Update Note   Patient evaluated at bedside due to elevated heart rate.  EKG obtained that demonstrates atrial fibrillation with RVR.  Likely multifactorial in the setting of underlying SBO, dehydration and anxiety related to NG tube placement.  - Telemetry monitoring added - LR 500 cc bolus - If no improvements in heart rate, patient may require metoprolol push versus diltiazem infusion.   Author: Jose Persia, MD 01/12/2023 7:07 PM  For on call review [email or web address].

## 2023-01-12 NOTE — Assessment & Plan Note (Signed)
-   Hold home Synthroid given n.p.o. status

## 2023-01-12 NOTE — Progress Notes (Signed)
Pt continues with Hr in 120-30's, Dr. Charleen Kirks aware. New orders given for diltiazem gtt, unable to give medication on this unit, pt transferred to 231, report given to [facility].

## 2023-01-12 NOTE — ED Notes (Signed)
First RN note: To ED [facility] from home Vomited today after eating lunch N/V since yesterday and abdominal discomfort (nml for her) VSS

## 2023-01-12 NOTE — Progress Notes (Signed)
NG tube retracted 6 cm per Dr Windell Moment request

## 2023-01-12 NOTE — Assessment & Plan Note (Addendum)
No wheezing on examination today.  - Continue home bronchodilators - Hold home montelukast in the setting of n.p.o. status

## 2023-01-13 ENCOUNTER — Inpatient Hospital Stay: Payer: Medicare Other

## 2023-01-13 DIAGNOSIS — K56609 Unspecified intestinal obstruction, unspecified as to partial versus complete obstruction: Secondary | ICD-10-CM | POA: Diagnosis not present

## 2023-01-13 LAB — CBC WITH DIFFERENTIAL/PLATELET
Abs Immature Granulocytes: 0.02 10*3/uL (ref 0.00–0.07)
Basophils Absolute: 0 10*3/uL (ref 0.0–0.1)
Basophils Relative: 0 %
Eosinophils Absolute: 0.1 10*3/uL (ref 0.0–0.5)
Eosinophils Relative: 1 %
HCT: 37 % (ref 36.0–46.0)
Hemoglobin: 12.4 g/dL (ref 12.0–15.0)
Immature Granulocytes: 0 %
Lymphocytes Relative: 17 %
Lymphs Abs: 1.4 10*3/uL (ref 0.7–4.0)
MCH: 30.7 pg (ref 26.0–34.0)
MCHC: 33.5 g/dL (ref 30.0–36.0)
MCV: 91.6 fL (ref 80.0–100.0)
Monocytes Absolute: 1 10*3/uL (ref 0.1–1.0)
Monocytes Relative: 12 %
Neutro Abs: 5.5 10*3/uL (ref 1.7–7.7)
Neutrophils Relative %: 70 %
Platelets: 254 10*3/uL (ref 150–400)
RBC: 4.04 MIL/uL (ref 3.87–5.11)
RDW: 13.6 % (ref 11.5–15.5)
WBC: 7.9 10*3/uL (ref 4.0–10.5)
nRBC: 0 % (ref 0.0–0.2)

## 2023-01-13 LAB — BASIC METABOLIC PANEL
Anion gap: 10 (ref 5–15)
BUN: 17 mg/dL (ref 8–23)
CO2: 22 mmol/L (ref 22–32)
Calcium: 8.4 mg/dL — ABNORMAL LOW (ref 8.9–10.3)
Chloride: 101 mmol/L (ref 98–111)
Creatinine, Ser: 0.74 mg/dL (ref 0.44–1.00)
GFR, Estimated: >60 mL/min (ref >60)
Glucose, Bld: 136 mg/dL — ABNORMAL HIGH (ref 70–99)
Potassium: 3.6 mmol/L (ref 3.5–5.1)
Sodium: 133 mmol/L — ABNORMAL LOW (ref 135–145)

## 2023-01-13 LAB — PHOSPHORUS: Phosphorus: 2.4 mg/dL — ABNORMAL LOW (ref 2.5–4.6)

## 2023-01-13 LAB — MAGNESIUM: Magnesium: 1.5 mg/dL — ABNORMAL LOW (ref 1.7–2.4)

## 2023-01-13 MED ORDER — ENOXAPARIN SODIUM 80 MG/0.8ML IJ SOSY
1.0000 mg/kg | PREFILLED_SYRINGE | Freq: Two times a day (BID) | INTRAMUSCULAR | Status: DC
  Administered 2023-01-13 – 2023-01-14 (×2): 67.5 mg via SUBCUTANEOUS
  Filled 2023-01-13 (×2): qty 0.8

## 2023-01-13 MED ORDER — MAGNESIUM SULFATE 4 GM/100ML IV SOLN
4.0000 g | Freq: Once | INTRAVENOUS | Status: AC
  Administered 2023-01-13: 4 g via INTRAVENOUS
  Filled 2023-01-13: qty 100

## 2023-01-13 MED ORDER — POTASSIUM CHLORIDE 10 MEQ/100ML IV SOLN
10.0000 meq | Freq: Once | INTRAVENOUS | Status: AC
  Administered 2023-01-13: 10 meq via INTRAVENOUS
  Filled 2023-01-13: qty 100

## 2023-01-13 MED ORDER — ORAL CARE MOUTH RINSE: 15.0000 mL | OROMUCOSAL | Status: DC | PRN

## 2023-01-13 MED ORDER — DIATRIZOATE MEGLUMINE & SODIUM 66-10 % PO SOLN
90.0000 mL | Freq: Once | ORAL | Status: AC
  Administered 2023-01-13: 90 mL via NASOGASTRIC

## 2023-01-13 NOTE — Progress Notes (Signed)
Patient ID: Amanda Dalton, female   DOB: [birth year], 89 y.o.   MRN: [id number]     [facility] Day(s): 1.   Interval History: Patient seen and examined, no acute events or new complaints overnight. Patient reports feeling okay.  She denies any nausea or vomiting.  She denies passing gas.  She denies any abdominal pain.  She quickly says: "I really do not want any surgery"  Vital signs in last 24 hours: [min-max] current  Temp:  [97.4 F (36.3 C)-98.6 F (37 C)] 98.4 F (36.9 C) (02/15 0408) Pulse Rate:  [83-136] 90 (02/15 0408) Resp:  [15-20] 16 (02/15 0408) BP: (132-177)/(73-112) 132/73 (02/15 0408) SpO2:  [93 %-97 %] 94 % (02/15 0408) Weight:  [68.5 kg] 68.5 kg (02/14 1255)     Height: 5' (152.4 cm) Weight: 68.5 kg BMI (Calculated): 29.49   Physical Exam:  Constitutional: alert, cooperative and no distress  Respiratory: breathing non-labored at rest  Cardiovascular: regular rate and sinus rhythm  Gastrointestinal: soft, non-tender, and non-distended with large ventral hernia with small defect.  Hernia reducible.  Labs:     Latest Ref Rng & Units 01/13/2023    2:31 AM 01/12/2023   12:56 PM 06/18/2022    2:21 PM  CBC  WBC 4.0 - 10.5 K/uL 7.9  10.7  7.2   Hemoglobin 12.0 - 15.0 g/dL 12.4  14.0  11.3   Hematocrit 36.0 - 46.0 % 37.0  41.8  34.9   Platelets 150 - 400 K/uL 254  297  336       Latest Ref Rng & Units 01/13/2023    2:31 AM 01/12/2023   12:56 PM 06/18/2022    2:21 PM  CMP  Glucose 70 - 99 mg/dL 136  167  130   BUN 8 - 23 mg/dL 17  21  20   $ Creatinine 0.44 - 1.00 mg/dL 0.74  0.75  0.87   Sodium 135 - 145 mmol/L 133  133  134   Potassium 3.5 - 5.1 mmol/L 3.6  3.6  3.8   Chloride 98 - 111 mmol/L 101  99  105   CO2 22 - 32 mmol/L 22  21  25   $ Calcium 8.9 - 10.3 mg/dL 8.4  9.4  8.9   Total Protein 6.5 - 8.1 g/dL  7.6  6.9   Total Bilirubin 0.3 - 1.2 mg/dL  1.5  0.8   Alkaline Phos 38 - 126 U/L  41  35   AST 15 - 41 U/L  23  20   ALT 0 - 44 U/L   14  15     Imaging studies: No new pertinent imaging studies   Assessment/Plan:  89 y.o. female with small bowel obstruction due to ventral hernia, complicated by pertinent comorbidities including A-fib on anticoagulation, COPD.   This morning patient without any clinical deterioration.  No abdominal distention.  Again noted with ventral hernia.  With some difficulty I was able to reduce the hernia.  As soon as I reduce the hernia it immediately comes back.  Patient is very concerned about having surgery.  I discussed with the patient that as long as I can reduce the hernia most likely the acute obstruction will resolve but she is very high risk of having recurrent bowel obstruction due to the small defect and how quickly the intestine herniated back through the defect.  She would like for her husband to be here to have further discussion.  In  the meantime I think that he will be reasonable to do Gastrografin challenge to see if the contrast passed the point of previous obstruction.  That way we can remove the NGT.  Continue holding anticoagulation in case patient change her mind and decide to proceed with surgery.  I will follow closely.  Arnold Long, MD

## 2023-01-13 NOTE — Consult Note (Signed)
[facility] for Therapeutic Lovenox Indication: atrial fibrillation  Patient Measurements: Height: 5' (152.4 cm) Weight: 68.5 kg (151 lb) IBW/kg (Calculated) : 45.5  Labs: Recent Labs    01/12/23 1256 01/13/23 0231  HGB 14.0 12.4  HCT 41.8 37.0  PLT 297 254  CREATININE 0.75 0.74   Estimated Creatinine Clearance: 41.2 mL/min (by C-G formula based on SCr of 0.74 mg/dL).  Medical History: Past Medical History:  Diagnosis Date   Anxiety    Asthma    Atrial flutter ([place])    a. Dx 11/17/2016-->CHA2DS2VASc = 3-->Eliquis 5 mg BID.   Breast cancer ([place])    a. 1996 s/p R modified radical mastectomy-->chemo w/ tamoxifen.   Endometrial cancer ([place])    a. 08/2001 s/p hysterectomy. No node involvement.   Fracture of right humerus 11/2008   Hypothyroidism    Osteoporosis    Squamous cell carcinoma of skin 01/29/2010   Left superior side. KA, WD SCC not entirely excluded.   Medications:  Apixaban 2.5 mg BID prior to admission (last patient reported dose 01/12/23)  Assessment: 89 y/o F with medical history as above and including atrial fibrillation on apixaban who is admitted with SBO secondary to upper abdominal ventral hernia. General surgery is following and surgery is being considered. Pharmacy consulted to initiate therapeutic Lovenox for atrial fibrillation while home apixaban is on hold in case surgery is needed.  Plan:  --Lovenox 67.5 mg (~ 1 mg/kg) q12h --CBC / Scr per protocol  Benita Gutter 01/13/2023,2:10 PM

## 2023-01-13 NOTE — Progress Notes (Signed)
[facility] Progress Note  Patient: Amanda Dalton    [id number]  DOA: 01/12/2023     Date of Service: the patient was seen and examined on 01/13/2023  Chief Complaint  Patient presents with   Vomiting    Patient presents to triage actively vomiting; She states that she has had intermittent lower abdominal pain for "a long time" (she says she has been to several doctors for the abdominal pain); This episode of abdominal pain began yesterday then she began vomiting later on that day; Denies diarrhea   Brief hospital course: ALEXANDRINE TALBOT is a 89 y.o. female with medical history significant of atrial flutter on AC, osteoporosis, hypothyroidism, previous breast and endometrial cancer, moderate persistent asthma, who presents to the ED due to vomiting.  ED workup, CT A/P showed small bowel obstruction due to upper abdominal ventral hernia. General surgery was consulted and patient was admitted for further management as below.  Assessment and Plan:  #Small bowel obstruction secondary to upper abdominal ventral hernia Keep n.p.o., continue IV fluid Continue NG tube with LIS  General surgery following Follow abdominal x-ray small bowel obstruction protocol 8-hour delay   #Hypomagnesemia, mag repleted. Monitor electrolytes and replete as needed.  Persistent atrial fibrillation (HCC) - Holding home Eliquis in the setting of SBO and uncertain timing of potential surgery Pharmacy consulted to start Lovenox therapeutic dose  Hypothyroidism - Hold home Synthroid given n.p.o. status   Uncomplicated asthma No wheezing on examination today. - Continue home bronchodilators - Hold home montelukast in the setting of n.p.o. status   Body mass index is 29.49 kg/m.  Interventions:      Diet: NPO DVT Prophylaxis: Subcutaneous Lovenox   Advance goals of care discussion: Full code  Family Communication: family was present at bedside, at the time of interview.  The pt provided  permission to discuss medical plan with the family. Opportunity was given to ask question and all questions were answered satisfactorily.   Disposition:  Pt is from Home, admitted with SBO, still has SBO, NPO and has NG Tube w/LIS, which precludes a safe discharge. Discharge to Home, when clinically stable and cleared by general surgery.  Subjective: No significant events overnight, patient denies any abdominal pain, patient was feeling nauseous and about to throw up, RN was called to give medication for the nausea vomiting.  Patient received contrast ordered by general surgery, after that she started passing gas but no BM.    Physical Exam: General: NAD, lying comfortably Appear in no distress, affect appropriate Eyes: PERRLA ENT: Oral Mucosa Clear, moist  Neck: no JVD,  Cardiovascular: S1 and S2 Present, no Murmur,  Respiratory: good respiratory effort, Bilateral Air entry equal and Decreased, no Crackles, no wheezes Abdomen: Bowel Sound present, Soft and no tenderness, NG tube intact Skin: no rashes Extremities: no Pedal edema, no calf tenderness Neurologic: without any new focal findings Gait not checked due to patient safety concerns  Vitals:   01/13/23 0052 01/13/23 0408 01/13/23 0745 01/13/23 1103  BP: 138/79 132/73 119/63 136/66  Pulse: (!) 107 90 83 93  Resp:  16 17 19  $ Temp:  98.4 F (36.9 C) 97.9 F (36.6 C) 98.1 F (36.7 C)  TempSrc:  Oral Oral   SpO2: 94% 94% 93% 92%  Weight:      Height:        Intake/Output Summary (Last 24 hours) at 01/13/2023 1347 Last data filed at 01/13/2023 1335 Gross per 24 hour  Intake 1606.73 ml  Output  550 ml  Net 1056.73 ml   Filed Weights   01/12/23 1255  Weight: 68.5 kg    Data Reviewed: I have personally reviewed and interpreted daily labs, tele strips, imagings as discussed above. I reviewed all nursing notes, pharmacy notes, vitals, pertinent old records I have discussed plan of care as described above with RN and  patient/family.  CBC: Recent Labs  Lab 01/12/23 1256 01/13/23 0231  WBC 10.7* 7.9  NEUTROABS 8.5* 5.5  HGB 14.0 12.4  HCT 41.8 37.0  MCV 90.5 91.6  PLT 297 0000000   Basic Metabolic Panel: Recent Labs  Lab 01/12/23 1256 01/13/23 0231  NA 133* 133*  K 3.6 3.6  CL 99 101  CO2 21* 22  GLUCOSE 167* 136*  BUN 21 17  CREATININE 0.75 0.74  CALCIUM 9.4 8.4*  MG 1.9 1.5*  PHOS  --  2.4*    Studies: DG Abd Portable 1 View  Result Date: 01/12/2023 CLINICAL DATA:  NG tube placement EXAM: PORTABLE ABDOMEN - 1 VIEW COMPARISON:  CT 01/12/2023 FINDINGS: Esophageal tube tip looped at the lower chest within large hiatal hernia, the tip is directed cranially over the lower third of the mediastinum. Dilated small bowel in the upper abdomen consistent with bowel obstruction IMPRESSION: Esophageal tube tip looped at the lower chest within large hiatal hernia, tip is directed cranially over the lower third of the mediastinum. Electronically Signed   By: Donavan Foil M.D.   On: 01/12/2023 17:05   CT ABDOMEN PELVIS W CONTRAST  Result Date: 01/12/2023 CLINICAL DATA:  Vomiting. Bowel obstruction suspected. Abdominal pain. EXAM: CT ABDOMEN AND PELVIS WITH CONTRAST TECHNIQUE: Multidetector CT imaging of the abdomen and pelvis was performed using the standard protocol following bolus administration of intravenous contrast. RADIATION DOSE REDUCTION: This exam was performed according to the departmental dose-optimization program which includes automated exposure control, adjustment of the mA and/or kV according to patient size and/or use of iterative reconstruction technique. CONTRAST:  173m OMNIPAQUE IOHEXOL 300 MG/ML  SOLN COMPARISON:  01/27/2021 FINDINGS: Lower chest: Chronic cardiomegaly. Chronic hiatal hernia. Chronic volume loss in the medial right middle lobe. Hepatobiliary: Liver parenchyma is normal.  No calcified gallstones. Pancreas: Normal Spleen: No significant finding. Chronic benign peripheral  calcification. Adrenals/Urinary Tract: Adrenal glands are normal. Simple appearing cysts of the kidneys. No follow-up recommended. No hydronephrosis. No stone or mass. Bladder shows a shallow diverticulum but is otherwise normal. Stomach/Bowel: Increase in size of an upper abdominal ventral hernia. The defect itself now measures 3.8 cm in diameter and there is more herniated mesentery, herniated small bowel and herniated transverse colon. There is acute small bowel obstruction due to this hernia. The distal small bowel is collapsed. The colon is not obstructed. Vascular/Lymphatic: Aortic atherosclerosis. No aneurysm. IVC is normal. Retroperitoneal surgical clips. No adenopathy. Reproductive: Previous hysterectomy.  No pelvic mass. Other: No free fluid or air. Musculoskeletal: Chronic lumbar degenerative changes. IMPRESSION: 1. Acute small bowel obstruction due to an upper abdominal ventral hernia. Herniated small bowel and herniated transverse colon. The hernia is larger than was seen in 2022. The colon is not obstructed. 2. Chronic hiatal hernia. 3. Aortic atherosclerosis. 4. Chronic volume loss in the medial right middle lobe. 5. Chronic lumbar degenerative changes. Aortic Atherosclerosis (ICD10-I70.0). Electronically Signed   By: MNelson ChimesM.D.   On: 01/12/2023 15:19    Scheduled Meds:  enoxaparin (LOVENOX) injection  40 mg Subcutaneous Q24H   fluticasone furoate-vilanterol  1 puff Inhalation Daily   sodium chloride flush  3 mL Intravenous Q12H   Continuous Infusions:  diltiazem (CARDIZEM) infusion 12.5 mg/hr (01/13/23 1215)   lactated ringers 75 mL/hr at 01/13/23 1335   PRN Meds: acetaminophen **OR** acetaminophen, ondansetron **OR** ondansetron (ZOFRAN) IV, mouth rinse, phenol, polyethylene glycol  Time spent: 35 minutes  Author: Val Riles. MD [facility] 01/13/2023 1:47 PM  To reach On-call, see care teams to locate the attending and reach out to them via [email or web address]. If  7PM-7AM, please contact night-coverage If you still have difficulty reaching the attending provider, please page the [facility] (Director on Call) for [facility] on amion for assistance.

## 2023-01-13 NOTE — Progress Notes (Addendum)
Patient ID: Amanda Dalton, female   DOB: Aug 01, 1931, 89 y.o.   MRN: [id number]     [facility] Day(s): 1.   Interval History: Patient seen and examined multiple times during the day  10:00 am: Patient again seen with ventral hernia with bowel contents.  Hernia was able to be reduced.  Patient got the Gastrografin contrast.  She endorses that she started passing gas after Gastrografin was given.  No abdominal pain.  No nausea or vomiting.  This visit follow-up was for 10 minutes.  12:00: Patient was reevaluated again with palpable hernia.  Palpable again was reduced by me.  Patient to visit continue passing gas.  She denies any nausea or vomiting.  She is asking for water.  I discussed again the benefit of surgery with patient will want to wait for x-ray to be done.  Follow-up visit for 10 minutes  2:30 pm: Patient was evaluated.  The hernia was again reduced by me.  Still not passing bowel movement.  Continue passing gas.  No pain.  Follow-up visit 10 minutes  4:30 pm: Patient was reevaluated again.  Niece was present at bedside.  I had a long discussion with the niece again about the current situation.  I showed the niece how to reduce the hernia.  I have been trying to show the patient or the husband but at their age and due to the complexity of the small defect and the amount of bowel I think that this is difficult for them.  Again discussion with the patient she is very decided to not have any surgery at this moment.  X-ray was present.  X-ray was taken.  I personally evaluated the images.  There is Gastrografin in the large intestine.  There is my evaluation of the small intestine.  I personally remove the NGT.  During this encounter even the niece took a picture of my batch.  After removal of the NGT patient requested to be discharged.  I asked the patient to at least tolerate diet.  This follow-up encounter was for 40 minutes.  Vital signs in last 24 hours: [min-max] current   Temp:  [97.4 F (36.3 C)-98.6 F (37 C)] 98.1 F (36.7 C) (02/15 1103) Pulse Rate:  [83-136] 93 (02/15 1103) Resp:  [15-20] 19 (02/15 1103) BP: (119-177)/(63-112) 136/66 (02/15 1103) SpO2:  [92 %-97 %] 92 % (02/15 1103)     Height: 5' (152.4 cm) Weight: 68.5 kg BMI (Calculated): 29.49   Physical Exam:  Constitutional: alert, cooperative and no distress  Respiratory: breathing non-labored at rest  Cardiovascular: regular rate and sinus rhythm  Gastrointestinal: soft, non-tender, and non-distended.  Ventral hernia, reducible.  Labs:     Latest Ref Rng & Units 01/13/2023    2:31 AM 01/12/2023   12:56 PM 06/18/2022    2:21 PM  CBC  WBC 4.0 - 10.5 K/uL 7.9  10.7  7.2   Hemoglobin 12.0 - 15.0 g/dL 12.4  14.0  11.3   Hematocrit 36.0 - 46.0 % 37.0  41.8  34.9   Platelets 150 - 400 K/uL 254  297  336       Latest Ref Rng & Units 01/13/2023    2:31 AM 01/12/2023   12:56 PM 06/18/2022    2:21 PM  CMP  Glucose 70 - 99 mg/dL 136  167  130   BUN 8 - 23 mg/dL 17  21  20   $ Creatinine 0.44 - 1.00 mg/dL 0.74  0.75  0.87   Sodium 135 - 145 mmol/L 133  133  134   Potassium 3.5 - 5.1 mmol/L 3.6  3.6  3.8   Chloride 98 - 111 mmol/L 101  99  105   CO2 22 - 32 mmol/L 22  21  25   $ Calcium 8.9 - 10.3 mg/dL 8.4  9.4  8.9   Total Protein 6.5 - 8.1 g/dL  7.6  6.9   Total Bilirubin 0.3 - 1.2 mg/dL  1.5  0.8   Alkaline Phos 38 - 126 U/L  41  35   AST 15 - 41 U/L  23  20   ALT 0 - 44 U/L  14  15     Imaging studies: Abdominal x-ray with Gastrografin in the large intestine   Assessment/Plan:  89 y.o. female with small bowel obstruction due to ventral hernia, complicated by pertinent comorbidities including A-fib on anticoagulation, COPD.    -The fact that I can reduce the hernia constantly, patient is passing gas and the Gastrografin is in the large intestine are sign of resolution of the acute bowel obstruction.  I again discussed with the patient that she is very likely to have recurrence of these  obstructions since she is unable to reduce the hernia by herself.  I just think that this is a matter of time only if she gets obstructed again.  Even with this discussion telling them that this will recur, the patient and her husband are very decided not to proceed with any surgery.  I think that patient will benefit of palliative evaluation to see if she is a candidate for hospice service.  She endorses that she lives in [place].  I remove the NGT and start patient on for liquid diet.  If she tolerates full liquid diet, technically she can be discharged since she is not consenting for surgical intervention to prevent recurrence of the obstruction.  Arnold Long, MD

## 2023-01-13 NOTE — TOC Initial Note (Signed)
Transition of Care [facility]) - Initial/Assessment Note    Patient Details  Name: Amanda Dalton MRN: [id number] Date of Birth: 02-Dec-1930  Transition of Care [facility]) CM/SW Contact:    Laurena Slimmer, RN Phone Number: 01/13/2023, 2:26 PM  Clinical Narrative:                  Transition of Care [facility]) Screening Note   Patient Details  Name: Amanda Dalton Date of Birth: Dec 31, 1930   Transition of Care [facility]) CM/SW Contact:    Laurena Slimmer, RN Phone Number: 01/13/2023, 2:26 PM    Transition of Care [facility]) has reviewed patient and no TOC needs have been identified at this time. We will continue to monitor patient advancement through interdisciplinary progression rounds. If new patient transition needs arise, please place a TOC consult.          Patient Goals and CMS Choice            Expected Discharge Plan and Services                                              Prior Living Arrangements/Services                       Activities of Daily Living Home Assistive Devices/Equipment: Cane (specify quad or straight) ADL Screening (condition at time of admission) Patient's cognitive ability adequate to safely complete daily activities?: Yes Is the patient deaf or have difficulty hearing?: No Does the patient have difficulty seeing, even when wearing glasses/contacts?: No Does the patient have difficulty concentrating, remembering, or making decisions?: No Patient able to express need for assistance with ADLs?: Yes Does the patient have difficulty dressing or bathing?: Yes Independently performs ADLs?: Yes (appropriate for developmental age) Does the patient have difficulty walking or climbing stairs?: No Weakness of Legs: Both Weakness of Arms/Hands: None  Permission Sought/Granted                  Emotional Assessment              Admission diagnosis:  Small bowel obstruction ([place]) [K56.609] SBO (small bowel obstruction) ([place])  [K56.609] Patient Active Problem List   Diagnosis Date Noted   SBO (small bowel obstruction) ([place]) 01/12/2023   Persistent atrial fibrillation ([place]) 04/04/2018   Encounter for preventive measure 02/17/2018   Hyperlipidemia 02/17/2018   Allergic rhinitis 01/23/2018   Atrial flutter with rapid ventricular response ([place]) 12/10/2016   Encounter for anticoagulation discussion and counseling 12/10/2016   Anxiety 12/10/2016   Hypothyroidism 12/10/2016   Abnormal CXR (chest x-ray) Q000111Q   Uncomplicated asthma Q000111Q   PCP:  [place] Pink, MD Pharmacy:   [facility] [address], [place] - [place] [address] [place] [address] Phone: [phone] Fax: [phone]     Social Determinants of Health (SDOH) Social History: SDOH Screenings   Food Insecurity: No Food Insecurity (01/12/2023)  Housing: Low Risk  (01/12/2023)  Transportation Needs: No Transportation Needs (01/12/2023)  Utilities: Not At Risk (01/12/2023)  Tobacco Use: Low Risk  (01/12/2023)   SDOH Interventions:     Readmission Risk Interventions     No data to display

## 2023-01-13 NOTE — Plan of Care (Signed)

## 2023-01-14 DIAGNOSIS — K56609 Unspecified intestinal obstruction, unspecified as to partial versus complete obstruction: Secondary | ICD-10-CM | POA: Diagnosis not present

## 2023-01-14 LAB — BASIC METABOLIC PANEL
Anion gap: 7 (ref 5–15)
BUN: 11 mg/dL (ref 8–23)
CO2: 26 mmol/L (ref 22–32)
Calcium: 7.6 mg/dL — ABNORMAL LOW (ref 8.9–10.3)
Chloride: 99 mmol/L (ref 98–111)
Creatinine, Ser: 0.56 mg/dL (ref 0.44–1.00)
GFR, Estimated: >60 mL/min (ref >60)
Glucose, Bld: 106 mg/dL — ABNORMAL HIGH (ref 70–99)
Potassium: 3 mmol/L — ABNORMAL LOW (ref 3.5–5.1)
Sodium: 132 mmol/L — ABNORMAL LOW (ref 135–145)

## 2023-01-14 LAB — CBC
HCT: 34.9 % — ABNORMAL LOW (ref 36.0–46.0)
Hemoglobin: 11.4 g/dL — ABNORMAL LOW (ref 12.0–15.0)
MCH: 30.3 pg (ref 26.0–34.0)
MCHC: 32.7 g/dL (ref 30.0–36.0)
MCV: 92.8 fL (ref 80.0–100.0)
Platelets: 224 10*3/uL (ref 150–400)
RBC: 3.76 MIL/uL — ABNORMAL LOW (ref 3.87–5.11)
RDW: 13.5 % (ref 11.5–15.5)
WBC: 6.3 10*3/uL (ref 4.0–10.5)
nRBC: 0 % (ref 0.0–0.2)

## 2023-01-14 LAB — MAGNESIUM: Magnesium: 2.2 mg/dL (ref 1.7–2.4)

## 2023-01-14 LAB — PHOSPHORUS: Phosphorus: 1.9 mg/dL — ABNORMAL LOW (ref 2.5–4.6)

## 2023-01-14 MED ORDER — LEVOTHYROXINE SODIUM 50 MCG PO TABS
75.0000 ug | ORAL_TABLET | Freq: Every day | ORAL | Status: DC
  Administered 2023-01-15: 75 ug via ORAL
  Filled 2023-01-14: qty 1

## 2023-01-14 MED ORDER — MONTELUKAST SODIUM 10 MG PO TABS
10.0000 mg | ORAL_TABLET | Freq: Every day | ORAL | Status: DC
  Administered 2023-01-14: 10 mg via ORAL
  Filled 2023-01-14: qty 1

## 2023-01-14 MED ORDER — POTASSIUM CHLORIDE 10 MEQ/100ML IV SOLN
10.0000 meq | INTRAVENOUS | Status: AC
  Administered 2023-01-14 (×6): 10 meq via INTRAVENOUS
  Filled 2023-01-14 (×7): qty 100

## 2023-01-14 MED ORDER — ATENOLOL 25 MG PO TABS
12.5000 mg | ORAL_TABLET | Freq: Every day | ORAL | Status: DC
  Administered 2023-01-14 – 2023-01-15 (×2): 12.5 mg via ORAL
  Filled 2023-01-14 (×3): qty 0.5

## 2023-01-14 MED ORDER — APIXABAN 2.5 MG PO TABS
2.5000 mg | ORAL_TABLET | Freq: Two times a day (BID) | ORAL | Status: DC
  Administered 2023-01-14 – 2023-01-15 (×2): 2.5 mg via ORAL
  Filled 2023-01-14 (×2): qty 1

## 2023-01-14 MED ORDER — POTASSIUM PHOSPHATES 15 MMOLE/5ML IV SOLN
30.0000 mmol | Freq: Once | INTRAVENOUS | Status: AC
  Administered 2023-01-14: 30 mmol via INTRAVENOUS
  Filled 2023-01-14: qty 10

## 2023-01-14 MED ORDER — CITALOPRAM HYDROBROMIDE 20 MG PO TABS
40.0000 mg | ORAL_TABLET | Freq: Every day | ORAL | Status: DC
  Administered 2023-01-14: 40 mg via ORAL
  Filled 2023-01-14: qty 2

## 2023-01-14 NOTE — Progress Notes (Signed)
[facility] Progress Note  Patient: Amanda Dalton    [id number]  DOA: 01/12/2023     Date of Service: the patient was seen and examined on 01/14/2023  Chief Complaint  Patient presents with   Vomiting    Patient presents to triage actively vomiting; She states that she has had intermittent lower abdominal pain for "a long time" (she says she has been to several doctors for the abdominal pain); This episode of abdominal pain began yesterday then she began vomiting later on that day; Denies diarrhea   Brief hospital course: Amanda Dalton is a 89 y.o. female with medical history significant of atrial flutter on AC, osteoporosis, hypothyroidism, previous breast and endometrial cancer, moderate persistent asthma, who presents to the ED due to vomiting.  ED workup, CT A/P showed small bowel obstruction due to upper abdominal ventral hernia. General surgery was consulted and patient was admitted for further management as below.  Assessment and Plan:  # Small bowel obstruction secondary to upper abdominal ventral hernia abdominal x-ray small bowel obstruction protocol 8-hour delay, showed some contrast in the colon. S/p NPO and NG tube, which was removed on 2/15, started full liquid diet, patient is moving bowels.  SBO resolved.  Cleared by general surgery. 2/16 started regular diet today  # Hypokalemia, potassium repleted # Hypophosphatemia due to nutritional deficiency, Phos repleted. # Hypomagnesemia, mag repleted.  Resolved # Hyponatremia, monitor sodium level  Monitor electrolytes and replete as needed.  # Persistent atrial fibrillation ([place]) 2/16 Eliquis was held due to SBO which has been resolved, patient was on Lovenox which has been discontinued and resumed Eliquis 2.5 mg p.o. twice daily home dose Discontinued IV Cardizem infusion, resumed atenolol 12.5 mg p.o. daily   # Hypothyroidism - Hold home Synthroid given n.p.o. status   # Uncomplicated asthma No wheezing on  examination today. - Continue home bronchodilators - Hold home montelukast in the setting of n.p.o. status  # Depression, continued Celexa   Body mass index is 29.49 kg/m.  Interventions:      Diet: Regular diet DVT Prophylaxis: Eliquis   Advance goals of care discussion: Full code  Family Communication: family was not present at bedside, at the time of interview.  The pt provided permission to discuss medical plan with the family. Opportunity was given to ask question and all questions were answered satisfactorily.   Disposition:  Pt is from Home, admitted with SBO, which has been resolved, diet advanced.  Still has low potassium and phosphorus, which precludes a safe discharge. Discharge to Home, most likely tomorrow a.m.   Subjective: No significant events overnight, patient tolerated diet well, passing BM, no abdominal pain, no nausea vomiting.  Patient was cleared by general surgery.  Patient wanted to go home but advised to stay due to electrolyte imbalance, we will plan to discharge her tomorrow a.m. Patient verbalized understanding and agreed to stay overnight.  Physical Exam: General: NAD, lying comfortably Appear in no distress, affect appropriate Eyes: PERRLA ENT: Oral Mucosa Clear, moist  Neck: no JVD,  Cardiovascular: S1 and S2 Present, no Murmur,  Respiratory: good respiratory effort, Bilateral Air entry equal and Decreased, no Crackles, no wheezes Abdomen: Bowel Sound present, Soft and no tenderness, ventral hernia was reduced by general surgery Skin: no rashes Extremities: no Pedal edema, no calf tenderness Neurologic: without any new focal findings Gait not checked due to patient safety concerns  Vitals:   01/13/23 2332 01/14/23 0329 01/14/23 0733 01/14/23 1205  BP: (!) 141/82 Marland Kitchen)  141/83 (!) 136/95 (!) 149/90  Pulse: 77 79 87 78  Resp: 18 20 18 17  $ Temp: 97.8 F (36.6 C) 98.5 F (36.9 C) 98 F (36.7 C) (!) 97.5 F (36.4 C)  TempSrc: Oral Oral  Oral   SpO2: 95% 98% 96% 97%  Weight:      Height:        Intake/Output Summary (Last 24 hours) at 01/14/2023 1312 Last data filed at 01/13/2023 1700 Gross per 24 hour  Intake 946.87 ml  Output 200 ml  Net 746.87 ml   Filed Weights   01/12/23 1255  Weight: 68.5 kg    Data Reviewed: I have personally reviewed and interpreted daily labs, tele strips, imagings as discussed above. I reviewed all nursing notes, pharmacy notes, vitals, pertinent old records I have discussed plan of care as described above with RN and patient/family.  CBC: Recent Labs  Lab 01/12/23 1256 01/13/23 0231 01/14/23 0500  WBC 10.7* 7.9 6.3  NEUTROABS 8.5* 5.5  --   HGB 14.0 12.4 11.4*  HCT 41.8 37.0 34.9*  MCV 90.5 91.6 92.8  PLT 297 254 [id number]   Basic Metabolic Panel: Recent Labs  Lab 01/12/23 1256 01/13/23 0231 01/14/23 0500  NA 133* 133* 132*  K 3.6 3.6 3.0*  CL 99 101 99  CO2 21* 22 26  GLUCOSE 167* 136* 106*  BUN 21 17 11  $ CREATININE 0.75 0.74 0.56  CALCIUM 9.4 8.4* 7.6*  MG 1.9 1.5* 2.2  PHOS  --  2.4* 1.9*    Studies: DG Abd Portable 1V-Small Bowel Obstruction Protocol-initial, 8 hr delay  Result Date: 01/13/2023 CLINICAL DATA:  Small bowel obstruction. EXAM: PORTABLE ABDOMEN - 1 VIEW COMPARISON:  01/12/2023 FINDINGS: Small bowel loops are diffusely opacified. There does appear to be contrast material in the distal transverse colon and splenic flexure of the left upper quadrant. Contrast material in the bladder is compatible with CT scan yesterday. IMPRESSION: Diffusely distended and opacified small bowel loops. There does appear to be contrast material in the nondilated distal transverse colon and splenic flexure. Electronically Signed   By: Misty Stanley M.D.   On: 01/13/2023 16:59    Scheduled Meds:  apixaban  2.5 mg Oral BID   atenolol  12.5 mg Oral Daily   citalopram  40 mg Oral Q2200   fluticasone furoate-vilanterol  1 puff Inhalation Daily   [START ON 01/15/2023] levothyroxine  75  mcg Oral QAC breakfast   montelukast  10 mg Oral Q2200   sodium chloride flush  3 mL Intravenous Q12H   Continuous Infusions:  diltiazem (CARDIZEM) infusion 10 mg/hr (01/14/23 0600)   lactated ringers 75 mL/hr at 01/14/23 0556   potassium chloride 10 mEq (01/14/23 1141)   potassium PHOSPHATE IVPB (in mmol)     PRN Meds: acetaminophen **OR** acetaminophen, ondansetron **OR** ondansetron (ZOFRAN) IV, mouth rinse, phenol, polyethylene glycol  Time spent: 50 minutes  Author: Val Riles. MD [facility] 01/14/2023 1:12 PM  To reach On-call, see care teams to locate the attending and reach out to them via [email or web address]. If 7PM-7AM, please contact night-coverage If you still have difficulty reaching the attending provider, please page the [facility] (Director on Call) for [facility] on amion for assistance.

## 2023-01-14 NOTE — Care Management Important Message (Signed)
Important Message  Patient Details  Name: Amanda Dalton MRN: [id number] Date of Birth: [birth year]   Medicare Important Message Given:  N/A - LOS <3 / Initial given by admissions     Dannette Barbara 01/14/2023, 12:33 PM

## 2023-01-14 NOTE — Progress Notes (Signed)
Patient ID: Amanda Dalton, female   DOB: 1931/08/25, 89 y.o.   MRN: [id number]     [place] [facility] Day(s): 2.   Interval History: Patient seen and examined, no acute events or new complaints overnight. Patient reports feeling well this morning.  Vital signs in last 24 hours: [min-max] current  Temp:  [97.8 F (36.6 C)-98.6 F (37 C)] 98.5 F (36.9 C) (02/16 0329) Pulse Rate:  [77-93] 79 (02/16 0329) Resp:  [17-20] 20 (02/16 0329) BP: (119-145)/(63-85) 141/83 (02/16 0329) SpO2:  [92 %-98 %] 98 % (02/16 0329)     Height: 5' (152.4 cm) Weight: 68.5 kg BMI (Calculated): 29.49   Physical Exam:  Constitutional: alert, cooperative and no distress  Respiratory: breathing non-labored at rest  Cardiovascular: regular rate and sinus rhythm  Gastrointestinal: soft, non-tender, and non-distended.  Ventral hernia reducible  Labs:     Latest Ref Rng & Units 01/14/2023    5:00 AM 01/13/2023    2:31 AM 01/12/2023   12:56 PM  CBC  WBC 4.0 - 10.5 K/uL 6.3  7.9  10.7   Hemoglobin 12.0 - 15.0 g/dL 11.4  12.4  14.0   Hematocrit 36.0 - 46.0 % 34.9  37.0  41.8   Platelets 150 - 400 K/uL 224  254  297       Latest Ref Rng & Units 01/13/2023    2:31 AM 01/12/2023   12:56 PM 06/18/2022    2:21 PM  CMP  Glucose 70 - 99 mg/dL 136  167  130   BUN 8 - 23 mg/dL 17  21  20   $ Creatinine 0.44 - 1.00 mg/dL 0.74  0.75  0.87   Sodium 135 - 145 mmol/L 133  133  134   Potassium 3.5 - 5.1 mmol/L 3.6  3.6  3.8   Chloride 98 - 111 mmol/L 101  99  105   CO2 22 - 32 mmol/L 22  21  25   $ Calcium 8.9 - 10.3 mg/dL 8.4  9.4  8.9   Total Protein 6.5 - 8.1 g/dL  7.6  6.9   Total Bilirubin 0.3 - 1.2 mg/dL  1.5  0.8   Alkaline Phos 38 - 126 U/L  41  35   AST 15 - 41 U/L  23  20   ALT 0 - 44 U/L  14  15     Imaging studies: No new pertinent imaging studies   Assessment/Plan:  89 y.o. female with small bowel obstruction due to ventral hernia, complicated by pertinent comorbidities including A-fib on  anticoagulation, COPD.   -Patient this morning feeling well.  She endorses she tolerated diet.  She endorses she had a bowel movement. -Again, I was able to reduce the hernia -At this point the acute bowel obstruction resolved. -Due to the size of the defect and the recent acute obstruction I recommend the patient multiple times to proceed with hernia repair to prevent recurrent obstruction.  I discussed with patient and her husband that she is very high risk of recurrent obstruction.  Both and then have this conversation multiple times during the admission and there is strongly refused to have any surgical intervention. -I think the patient would benefit of palliative/hospice evaluation for discussion of goals of care and see if she would benefit of home hospice.  She endorses that she lives in [facility] which she gets good care. -No surgical follow-up needed at this moment. -May refuse her medications including anticoagulation. -Stable  to be discharged from surgical standpoint when medically stable  Arnold Long, MD

## 2023-01-15 DIAGNOSIS — K56609 Unspecified intestinal obstruction, unspecified as to partial versus complete obstruction: Secondary | ICD-10-CM | POA: Diagnosis not present

## 2023-01-15 LAB — CBC
HCT: 34.9 % — ABNORMAL LOW (ref 36.0–46.0)
Hemoglobin: 11.5 g/dL — ABNORMAL LOW (ref 12.0–15.0)
MCH: 30.5 pg (ref 26.0–34.0)
MCHC: 33 g/dL (ref 30.0–36.0)
MCV: 92.6 fL (ref 80.0–100.0)
Platelets: 233 10*3/uL (ref 150–400)
RBC: 3.77 MIL/uL — ABNORMAL LOW (ref 3.87–5.11)
RDW: 13.7 % (ref 11.5–15.5)
WBC: 5.7 10*3/uL (ref 4.0–10.5)
nRBC: 0 % (ref 0.0–0.2)

## 2023-01-15 LAB — BASIC METABOLIC PANEL
Anion gap: 7 (ref 5–15)
BUN: 9 mg/dL (ref 8–23)
CO2: 24 mmol/L (ref 22–32)
Calcium: 8.1 mg/dL — ABNORMAL LOW (ref 8.9–10.3)
Chloride: 105 mmol/L (ref 98–111)
Creatinine, Ser: 0.62 mg/dL (ref 0.44–1.00)
GFR, Estimated: >60 mL/min (ref >60)
Glucose, Bld: 117 mg/dL — ABNORMAL HIGH (ref 70–99)
Potassium: 3.9 mmol/L (ref 3.5–5.1)
Sodium: 136 mmol/L (ref 135–145)

## 2023-01-15 LAB — MAGNESIUM: Magnesium: 1.9 mg/dL (ref 1.7–2.4)

## 2023-01-15 LAB — PHOSPHORUS: Phosphorus: 2.5 mg/dL (ref 2.5–4.6)

## 2023-01-15 MED ORDER — CETIRIZINE HCL 10 MG PO TABS: 10.0000 mg | ORAL_TABLET | Freq: Every day | ORAL | Status: DC

## 2023-01-15 MED ORDER — POLYETHYLENE GLYCOL 3350 17 G PO PACK: 17.0000 g | PACK | Freq: Every day | ORAL | 0 refills | Status: DC | PRN

## 2023-01-15 MED ORDER — ATENOLOL 25 MG PO TABS: 12.5000 mg | ORAL_TABLET | Freq: Every day | ORAL | 3 refills | Status: DC

## 2023-01-15 MED ORDER — CALCIUM-VITAMIN D 600-400 MG-UNIT PO TABS: 1.0000 | ORAL_TABLET | Freq: Every day | ORAL | Status: AC

## 2023-01-15 MED ORDER — HYDRALAZINE HCL 50 MG PO TABS: 50.0000 mg | ORAL_TABLET | Freq: Four times a day (QID) | ORAL | 0 refills | Status: DC | PRN

## 2023-01-15 MED ORDER — HYDRALAZINE HCL 20 MG/ML IJ SOLN
10.0000 mg | Freq: Four times a day (QID) | INTRAMUSCULAR | Status: DC | PRN
  Administered 2023-01-15: 10 mg via INTRAVENOUS
  Filled 2023-01-15: qty 1

## 2023-01-15 MED ORDER — MONTELUKAST SODIUM 10 MG PO TABS: 10.0000 mg | ORAL_TABLET | Freq: Every day | ORAL | Status: AC

## 2023-01-15 MED ORDER — CITALOPRAM HYDROBROMIDE 40 MG PO TABS: 40.0000 mg | ORAL_TABLET | Freq: Every day | ORAL | Status: AC

## 2023-01-15 MED ORDER — HYDRALAZINE HCL 50 MG PO TABS: 50.0000 mg | ORAL_TABLET | Freq: Four times a day (QID) | ORAL | Status: DC | PRN

## 2023-01-15 NOTE — TOC Transition Note (Signed)
Transition of Care [facility]) - CM/SW Discharge Note   Patient Details  Name: Amanda Dalton MRN: [id number] Date of Birth: September 08, 1931  [facility]) CM/SW Contact:  Tiburcio Bash, LCSW Phone Number: 01/15/2023, 10:49 AM   Clinical Narrative:     Patient to dc home today back to [place] apartment with husband to transport. Spoke to patient and husband at bedside they request [facility] PT OT and aide , no agency preference. Jason with [facility] able to accept. No dme needs patient reports she has a 3in1 and walker at home.   No further dc needs.   Final next level of care: Home w Home Health Services Barriers to Discharge: No Barriers Identified   Patient Goals and CMS Choice CMS Medicare.gov Compare Post Acute Care list provided to:: Patient Choice offered to / list presented to : Patient  Discharge Placement                         Discharge Plan and Services Additional resources added to the After Visit Summary for                                       Social Determinants of Health (SDOH) Interventions SDOH Screenings   Food Insecurity: No Food Insecurity (01/12/2023)  Housing: Low Risk  (01/12/2023)  Transportation Needs: No Transportation Needs (01/12/2023)  Utilities: Not At Risk (01/12/2023)  Tobacco Use: Low Risk  (01/12/2023)     Readmission Risk Interventions     No data to display

## 2023-01-15 NOTE — Progress Notes (Signed)
Patient ID: Amanda Dalton, female   DOB: 12-07-1930, 89 y.o.   MRN: [id number]     [place] [facility] Day(s): 3.   Interval History: Patient seen and examined, no acute events or new complaints overnight. Patient reports she has not had any new abdominal pain.  She denies any nausea or vomiting.  She endorses that she is day and other day because of low potassium.  Today they are checking her blood pressure because it was high.  Vital signs in last 24 hours: [min-max] current  Temp:  [97.5 F (36.4 C)-98.3 F (36.8 C)] 97.5 F (36.4 C) (02/17 0732) Pulse Rate:  [55-90] 90 (02/17 0732) Resp:  [15-19] 16 (02/17 0732) BP: (139-196)/(71-111) 196/111 (02/17 0732) SpO2:  [96 %-100 %] 96 % (02/17 0732)     Height: 5' (152.4 cm) Weight: 68.5 kg BMI (Calculated): 29.49   Physical Exam:  Constitutional: alert, cooperative and no distress  Respiratory: breathing non-labored at rest  Cardiovascular: regular rate and sinus rhythm  Gastrointestinal: soft, non-tender, and non-distended.  Ventral hernia, reducible.  Labs:     Latest Ref Rng & Units 01/15/2023    6:45 AM 01/14/2023    5:00 AM 01/13/2023    2:31 AM  CBC  WBC 4.0 - 10.5 K/uL 5.7  6.3  7.9   Hemoglobin 12.0 - 15.0 g/dL 11.5  11.4  12.4   Hematocrit 36.0 - 46.0 % 34.9  34.9  37.0   Platelets 150 - 400 K/uL 233  224  254       Latest Ref Rng & Units 01/15/2023    6:45 AM 01/14/2023    5:00 AM 01/13/2023    2:31 AM  CMP  Glucose 70 - 99 mg/dL 117  106  136   BUN 8 - 23 mg/dL 9  11  17   $ Creatinine 0.44 - 1.00 mg/dL 0.62  0.56  0.74   Sodium 135 - 145 mmol/L 136  132  133   Potassium 3.5 - 5.1 mmol/L 3.9  3.0  3.6   Chloride 98 - 111 mmol/L 105  99  101   CO2 22 - 32 mmol/L 24  26  22   $ Calcium 8.9 - 10.3 mg/dL 8.1  7.6  8.4     Imaging studies: No new pertinent imaging studies   Assessment/Plan:  89 y.o. female with small bowel obstruction due to ventral hernia, complicated by pertinent comorbidities including  A-fib on anticoagulation, COPD.    -Patient with reducible ventral hernia.  Intestinal content immediately herniates through the small defect. -I again discussed with patient that if she is unable to reduce the hernia at home by herself she is high risk of recurrent obstruction -Patient reports: "I hope that you understand why and want surgery.  At this moment I just want to live.". -I discussed with patient that I respect her decision and I understand that she is afraid about surgery.  As long as she understands that she might come back with obstruction, she is capable of making decision not to have surgery. -Husband was not at bedside this morning has also been very clear that they do not want any surgical intervention -I think the patient will benefit of hospice at home. -Potassium today is 3.9. -Patient can be discharged from surgical standpoint when medically stable.  No surgical follow-up needed.  Arnold Long, MD

## 2023-01-15 NOTE — Progress Notes (Signed)
Patient IV's removed, personal belongings returned to patient and spouse carried them to the car, Patient assisted with getting dressed in personal clothes brought by husband. AVS corrected, Reprinted, and discussed with spouse and patient. New meds discussed and appointments reviewed. Both acknowledged full understanding. Patient transported to car where husband has pulled up to main entrance.

## 2023-01-15 NOTE — Discharge Summary (Signed)
[facility] Discharge Summary   Patient: Amanda Dalton [id number]  PCP: [place] Pink, MD  Date of admission: 01/12/2023   Date of discharge:  01/15/2023     Discharge Diagnoses:  Principal Problem:   SBO (small bowel obstruction) ([place]) Active Problems:   Uncomplicated asthma   Hypothyroidism   Persistent atrial fibrillation ([place])   Admitted From: [facility]  Disposition:  [facility]   Recommendations for Outpatient Follow-up:  PCP: in 1 wk General surgery in 1 to 2 weeks Follow up LABS/TEST:     Diet recommendation: Cardiac diet  Activity: The patient is advised to gradually reintroduce usual activities, as tolerated  Discharge Condition: stable  Code Status: Full code   History of present illness: As per the H and P dictated on admission Hospital Course:  Amanda Dalton is a 89 y.o. female with medical history significant of atrial flutter on AC, osteoporosis, hypothyroidism, previous breast and endometrial cancer, moderate persistent asthma, who presents to the ED due to vomiting.  ED workup, CT A/P showed small bowel obstruction due to upper abdominal ventral hernia. General surgery was consulted and patient was admitted for further management as below.   Assessment and Plan: # Small bowel obstruction secondary to upper abdominal ventral hernia abdominal x-ray small bowel obstruction protocol 8-hour delay, showed some contrast in the colon. S/p NPO and NG tube, which was removed on 2/15, started full liquid diet, patient is moving bowels.  SBO resolved.  Cleared by general surgery. 2/16 started regular diet today # Hypokalemia, potassium repleted.  Resolved # Hypophosphatemia due to nutritional deficiency, Phos repleted.  Resolved # Hypomagnesemia, mag repleted.  Resolved # Hyponatremia, resolved, sodium 136 today  # Persistent atrial fibrillation ([place]) 2/16 Eliquis was held due to SBO which has been resolved, patient was on Lovenox which has been  discontinued and resumed Eliquis 2.5 mg p.o. twice daily home dose Discontinued IV Cardizem infusion, resumed atenolol 12.5 mg p.o. daily. 2/17 patient blood pressure was elevated could be secondary to anxiety, patient received 1 dose of IV hydralazine, BP stable now, we will continue atenolol daily.  Patient was advised to monitor BP at home and follow with PCP. # Hypothyroidism, Resumed Synthroid home dose  # Uncomplicated asthma, No wheezing on exam, Continue home bronchodilators, resumed montelukast home dose. # Depression, continued Celexa Body mass index is 29.49 kg/m.  Nutrition Interventions:  Patient was ambulatory without any assistance. On the day of the discharge the patient's vitals were stable, and no other acute medical condition were reported by patient. the patient was felt safe to be discharge at  [place]    Consultants: General surgery Procedures: None  Discharge Exam: General: Appear in no distress, no Rash; Oral Mucosa Clear, moist. Cardiovascular: S1 and S2 Present, no Murmur, Respiratory: normal respiratory effort, Bilateral Air entry present and no Crackles, no wheezes Abdomen: Bowel Sound present, Soft and no tenderness, ventral hernia, reducible Extremities: no Pedal edema, no calf tenderness Neurology: alert and oriented to time, place, and person affect appropriate.  Filed Weights   01/12/23 1255  Weight: 68.5 kg   Vitals:   01/15/23 0732 01/15/23 0941  BP: (!) 196/111 139/78  Pulse: 90 (!) 107  Resp: 16   Temp: (!) 97.5 F (36.4 C)   SpO2: 96% 97%    DISCHARGE MEDICATION: Allergies as of 01/15/2023       Reactions   Paxil [paroxetine Hcl] Nausea Only        Medication  List     TAKE these medications    Advair HFA 45-21 MCG/ACT inhaler Generic drug: fluticasone-salmeterol Inhale into the lungs.   albuterol 108 (90 Base) MCG/ACT inhaler Commonly known as: VENTOLIN HFA Inhale 1 puff into the lungs every 4 (four) hours as  needed.   ascorbic acid 500 MG tablet Commonly known as: VITAMIN C Take 500 mg by mouth daily in the afternoon. Most days   atenolol 25 MG tablet Commonly known as: TENORMIN Take 0.5 tablets (12.5 mg total) by mouth daily. Skip the dose if systolic BP 123456 mmHg What changed:  when to take this reasons to take this additional instructions   budesonide 0.5 MG/2ML nebulizer solution Commonly known as: PULMICORT Take 0.5 mg by nebulization daily.   Calcium-Vitamin D 600-400 MG-UNIT Tabs Take 1 tablet by mouth daily at 10 pm.   cetirizine 10 MG tablet Commonly known as: ZYRTEC Take 10 mg by mouth daily at 10 pm.   citalopram 40 MG tablet Commonly known as: CELEXA Take 40 mg by mouth daily at 10 pm.   Eliquis 2.5 MG Tabs tablet Generic drug: apixaban Take 1 tablet by mouth twice daily   furosemide 20 MG tablet Commonly known as: LASIX Take 1 tablet (20 mg total) by mouth daily as needed for edema.   hydrALAZINE 50 MG tablet Commonly known as: APRESOLINE Take 1 tablet (50 mg total) by mouth every 6 (six) hours as needed (SBP >160).   hyoscyamine 0.125 MG SL tablet Commonly known as: LEVSIN SL Place under the tongue every 4 (four) hours as needed.   ipratropium 0.06 % nasal spray Commonly known as: ATROVENT Place 2 sprays into both nostrils 3 (three) times daily.   levalbuterol 45 MCG/ACT inhaler Commonly known as: XOPENEX HFA Inhale into the lungs.   levothyroxine 75 MCG tablet Commonly known as: SYNTHROID Take 75 mcg by mouth daily before breakfast.   montelukast 10 MG tablet Commonly known as: SINGULAIR Take 10 mg by mouth daily at 10 pm.   multivitamin with minerals Tabs tablet Take 1 tablet by mouth daily.   polyethylene glycol 17 g packet Commonly known as: MIRALAX / GLYCOLAX Place 17 g into feeding tube daily as needed for mild constipation.       Allergies  Allergen Reactions   Paxil [Paroxetine Hcl] Nausea Only   Discharge Instructions      Call MD for:  difficulty breathing, headache or visual disturbances   Complete by: As directed    Call MD for:  extreme fatigue   Complete by: As directed    Call MD for:  persistant dizziness or light-headedness   Complete by: As directed    Call MD for:  persistant nausea and vomiting   Complete by: As directed    Call MD for:  severe uncontrolled pain   Complete by: As directed    Call MD for:  temperature >100.4   Complete by: As directed    Diet - low sodium heart healthy   Complete by: As directed    Discharge instructions   Complete by: As directed    Follow-up with PCP in 1 week, continue to monitor BP at home and follow with PCP to titrate medications accordingly. Follow with general surgery in outpatient as needed.   Increase activity slowly   Complete by: As directed        The results of significant diagnostics from this hospitalization (including imaging, microbiology, ancillary and laboratory) are listed below for reference.  Significant Diagnostic Studies: DG Abd Portable 1V-Small Bowel Obstruction Protocol-initial, 8 hr delay  Result Date: 01/13/2023 CLINICAL DATA:  Small bowel obstruction. EXAM: PORTABLE ABDOMEN - 1 VIEW COMPARISON:  01/12/2023 FINDINGS: Small bowel loops are diffusely opacified. There does appear to be contrast material in the distal transverse colon and splenic flexure of the left upper quadrant. Contrast material in the bladder is compatible with CT scan yesterday. IMPRESSION: Diffusely distended and opacified small bowel loops. There does appear to be contrast material in the nondilated distal transverse colon and splenic flexure. Electronically Signed   By: Misty Stanley M.D.   On: 01/13/2023 16:59   DG Abd Portable 1 View  Result Date: 01/12/2023 CLINICAL DATA:  NG tube placement EXAM: PORTABLE ABDOMEN - 1 VIEW COMPARISON:  CT 01/12/2023 FINDINGS: Esophageal tube tip looped at the lower chest within large hiatal hernia, the tip is directed  cranially over the lower third of the mediastinum. Dilated small bowel in the upper abdomen consistent with bowel obstruction IMPRESSION: Esophageal tube tip looped at the lower chest within large hiatal hernia, tip is directed cranially over the lower third of the mediastinum. Electronically Signed   By: Donavan Foil M.D.   On: 01/12/2023 17:05   CT ABDOMEN PELVIS W CONTRAST  Result Date: 01/12/2023 CLINICAL DATA:  Vomiting. Bowel obstruction suspected. Abdominal pain. EXAM: CT ABDOMEN AND PELVIS WITH CONTRAST TECHNIQUE: Multidetector CT imaging of the abdomen and pelvis was performed using the standard protocol following bolus administration of intravenous contrast. RADIATION DOSE REDUCTION: This exam was performed according to the departmental dose-optimization program which includes automated exposure control, adjustment of the mA and/or kV according to patient size and/or use of iterative reconstruction technique. CONTRAST:  162m OMNIPAQUE IOHEXOL 300 MG/ML  SOLN COMPARISON:  01/27/2021 FINDINGS: Lower chest: Chronic cardiomegaly. Chronic hiatal hernia. Chronic volume loss in the medial right middle lobe. Hepatobiliary: Liver parenchyma is normal.  No calcified gallstones. Pancreas: Normal Spleen: No significant finding. Chronic benign peripheral calcification. Adrenals/Urinary Tract: Adrenal glands are normal. Simple appearing cysts of the kidneys. No follow-up recommended. No hydronephrosis. No stone or mass. Bladder shows a shallow diverticulum but is otherwise normal. Stomach/Bowel: Increase in size of an upper abdominal ventral hernia. The defect itself now measures 3.8 cm in diameter and there is more herniated mesentery, herniated small bowel and herniated transverse colon. There is acute small bowel obstruction due to this hernia. The distal small bowel is collapsed. The colon is not obstructed. Vascular/Lymphatic: Aortic atherosclerosis. No aneurysm. IVC is normal. Retroperitoneal surgical clips.  No adenopathy. Reproductive: Previous hysterectomy.  No pelvic mass. Other: No free fluid or air. Musculoskeletal: Chronic lumbar degenerative changes. IMPRESSION: 1. Acute small bowel obstruction due to an upper abdominal ventral hernia. Herniated small bowel and herniated transverse colon. The hernia is larger than was seen in 2022. The colon is not obstructed. 2. Chronic hiatal hernia. 3. Aortic atherosclerosis. 4. Chronic volume loss in the medial right middle lobe. 5. Chronic lumbar degenerative changes. Aortic Atherosclerosis (ICD10-I70.0). Electronically Signed   By: MNelson ChimesM.D.   On: 01/12/2023 15:19    Microbiology: No results found for this or any previous visit (from the past 240 hour(s)).   Labs: CBC: Recent Labs  Lab 01/12/23 1256 01/13/23 0231 01/14/23 0500 01/15/23 0645  WBC 10.7* 7.9 6.3 5.7  NEUTROABS 8.5* 5.5  --   --   HGB 14.0 12.4 11.4* 11.5*  HCT 41.8 37.0 34.9* 34.9*  MCV 90.5 91.6 92.8 92.6  PLT 297 254  224 0000000   Basic Metabolic Panel: Recent Labs  Lab 01/12/23 1256 01/13/23 0231 01/14/23 0500 01/15/23 0645  NA 133* 133* 132* 136  K 3.6 3.6 3.0* 3.9  CL 99 101 99 105  CO2 21* 22 26 24  $ GLUCOSE 167* 136* 106* 117*  BUN 21 17 11 9  $ CREATININE 0.75 0.74 0.56 0.62  CALCIUM 9.4 8.4* 7.6* 8.1*  MG 1.9 1.5* 2.2 1.9  PHOS  --  2.4* 1.9* 2.5   Liver Function Tests: Recent Labs  Lab 01/12/23 1256  AST 23  ALT 14  ALKPHOS 41  BILITOT 1.5*  PROT 7.6  ALBUMIN 4.2   Recent Labs  Lab 01/12/23 1256  LIPASE 28   No results for input(s): "AMMONIA" in the last 168 hours. Cardiac Enzymes: No results for input(s): "CKTOTAL", "CKMB", "CKMBINDEX", "TROPONINI" in the last 168 hours. BNP (last 3 results) Recent Labs    06/18/22 1421  BNP 112.2*   CBG: No results for input(s): "GLUCAP" in the last 168 hours.  Time spent: 35 minutes  Signed:  Val Riles  [facility] 01/15/2023 10:34 AM

## 2023-01-15 NOTE — Progress Notes (Signed)
Mobility Specialist - Progress Note   01/15/23 0853  Mobility  Activity Ambulated with assistance in room;Transferred to/from [facility]  Level of Assistance Standby assist, set-up cues, supervision of patient - no hands on  Assistive Device None  Distance Ambulated (ft) 5 ft  Activity Response Tolerated well  Mobility Referral Yes  $Mobility charge 1 Mobility   Author responds to call bell. Pt sitting on [facility] requesting transfer to recliner. Pt STS and ambulates 4 steps SBA with no LOB noted. Pt left in recliner with needs in reach and chair alarm set.   Gretchen Short  Mobility Specialist  01/15/23 8:54 AM

## 2023-02-06 NOTE — Progress Notes (Unsigned)
Cardiology Office Note  Date:  02/07/2023   ID:  Amanda Dalton, DOB 05/10/1931, MRN [id number]  PCP:  Maryland Pink, MD   Chief Complaint  Patient presents with   6 month follow up     Patient was at [facility] on 01/12/2023 for a small bowel obstruction. Medications reviewed by the patient verbally.     HPI:  89 y/o woman with a h/o  hypothyroidism,  anxiety,  asthma,  atrial flutter rate 120 bpm, started on anticoagulation,  previous episode of atrial flutter presented in the setting of attending a party 10/2016: fib flutter 11/2016: NSR Atrial fibrillation 5/19 again with spontaneous conversion. Cardioversion cancelled 04/19/2018 cardioversion 2/21; 3/21  who presents for  follow-up of her arrhythmia, permenant atrial fibrillation  LOV 07/2022 Recent hospitalization February 2024, small bowel obstruction, vomiting Upper abdominal ventral hernia Treated conservatively with NG tube to suction Seen by surgical team Still with hernia, she declined surgery  In follow-up today, abdomen feels fine, she is wearing a Velcro back brace, husband able to reduce her hernia  Still on eliquis 2.5 BID, dose dropped 9/22 with Dr Caryl Comes, at that time weight was slightly above 60 kg Weight today 68 kg  Feels she is asymptomatic from her arrhythmia  EKG personally reviewed by myself on todays visit Atrial fibrillation rate 84 bpm no significant ST-T wave changes  Other past medical history reviewed June 2023, had a mechincal fall at home, hurt knee Significant bruising, blistering sores with swelling Was seen by [facility]  Seen by pulmonary 06/17/22, was given torsemide for leg swelling Took torsemide for one week Mild imporvement in leg swelling No DVT on u/s Not on torsemide currently Has not been taking Lasix but has this on her list to take as needed for leg swelling, weight gain  No sx from atrial fib Weight is 10 pounds higher compared to last year  Continues to have ventral  hernia, no significant pain Sleep reported periodic N/V, ABD bloating   01/2021, was in atrial fibrillation,  Does not take atenolol, amiodarone given  bradycardia  Prior imaging reviewed with her in detail, CT scan, 01/2021 Large hiatal hernia. Ventral wall hernia in the upper abdomen containing portions of the small bowel without evidence for an obstruction. Rectosigmoid diverticulosis without acute inflammation. Kidney cyst aortic atherosclerosis   Prior history of GI bleeds, none recently,  Cardioversion cancelled 04/19/2018 Was in NSR on amiodarone and atenolol on arrival to the hospital She had bradycardia and amiodarone decreased down to 100 mg daily atenolol down to 12.5 mg daily  Previously received adenosine IV push for tachycardia revealing flutter waves CHA2DS2VASc of 3 (age/age/gender), started on eliquis 5 mg BID (creat 0.8 12/4, wt > 60 kg)  Echocardiogram 12/08/2016 Ejection fraction 50-55%, moderately elevated right heart pressures 50-55 mmHg  Had a fall Nov 2017: large laceration on forehead   PMH:   has a past medical history of Anxiety, Asthma, Atrial flutter ([place]), Breast cancer ([place]), Endometrial cancer ([place]), Fracture of right humerus (11/2008), Hypothyroidism, Osteoporosis, and Squamous cell carcinoma of skin (01/29/2010).  PSH:    Past Surgical History:  Procedure Laterality Date   CARDIOVERSION N/A 04/19/2018   Procedure: CARDIOVERSION;  Surgeon: Minna Merritts, MD;  Location: [facility] ORS;  Service: Cardiovascular;  Laterality: N/A;   CARDIOVERSION N/A 01/18/2020   Procedure: CARDIOVERSION;  Surgeon: Minna Merritts, MD;  Location: [facility] ORS;  Service: Cardiovascular;  Laterality: N/A;   Right Mastectomy  [place]  08/2001    Current Outpatient Medications  Medication Sig Dispense Refill   ADVAIR HFA 45-21 MCG/ACT inhaler Inhale into the lungs.     albuterol (VENTOLIN HFA) 108 (90 Base) MCG/ACT inhaler Inhale 1 puff into the lungs  every 4 (four) hours as needed.      apixaban (ELIQUIS) 2.5 MG TABS tablet Take 1 tablet by mouth twice daily 60 tablet 5   ascorbic acid (VITAMIN C) 500 MG tablet Take 500 mg by mouth daily in the afternoon. Most days     atenolol (TENORMIN) 25 MG tablet Take 0.5 tablets (12.5 mg total) by mouth daily. Skip the dose if systolic BP 123456 mmHg 90 tablet 3   budesonide (PULMICORT) 0.5 MG/2ML nebulizer solution Take 0.5 mg by nebulization daily.     Calcium Carb-Cholecalciferol (CALCIUM-VITAMIN D) 600-400 MG-UNIT TABS Take 1 tablet by mouth daily. 60 tablet    cetirizine (ZYRTEC) 10 MG tablet Take 1 tablet (10 mg total) by mouth daily.     citalopram (CELEXA) 40 MG tablet Take 1 tablet (40 mg total) by mouth daily.     hydrALAZINE (APRESOLINE) 50 MG tablet Take 1 tablet (50 mg total) by mouth every 6 (six) hours as needed (SBP >160). 30 tablet 0   ipratropium (ATROVENT) 0.06 % nasal spray Place 2 sprays into both nostrils 3 (three) times daily.     levothyroxine (SYNTHROID, LEVOTHROID) 75 MCG tablet Take 75 mcg by mouth daily before breakfast.     montelukast (SINGULAIR) 10 MG tablet Take 1 tablet (10 mg total) by mouth at bedtime.     Multiple Vitamin (MULTIVITAMIN WITH MINERALS) TABS tablet Take 1 tablet by mouth daily.     furosemide (LASIX) 20 MG tablet Take 1 tablet (20 mg total) by mouth daily as needed for edema. 30 tablet 6   hyoscyamine (LEVSIN SL) 0.125 MG SL tablet Place under the tongue every 4 (four) hours as needed. (Patient not taking: Reported on 01/12/2023)     levalbuterol (XOPENEX HFA) 45 MCG/ACT inhaler Inhale into the lungs. (Patient not taking: Reported on 01/12/2023)     No current facility-administered medications for this visit.    Allergies:   Paroxetine hcl   Social History:  The patient  reports that she has never smoked. She has never used smokeless tobacco. She reports current alcohol use of about 1.0 standard drink of alcohol per week. She reports that she does not use  drugs.   Family History:   family history includes Breast cancer in her sister; Hypertension in her mother; Osteoporosis in her sister; Stroke in her father and mother.    Review of Systems: Review of Systems  Constitutional: Negative.   HENT: Negative.    Respiratory: Negative.    Cardiovascular: Negative.   Gastrointestinal: Negative.   Musculoskeletal: Negative.        Gait instability  Neurological: Negative.   Psychiatric/Behavioral: Negative.    All other systems reviewed and are negative.  PHYSICAL EXAM: VS:  BP 130/80 (BP Location: Left Arm, Patient Position: Sitting, Cuff Size: Normal)   Pulse 84   Ht 5' (1.524 m)   Wt 150 lb 8 oz (68.3 kg)   SpO2 98%   BMI 29.39 kg/m  , BMI Body mass index is 29.39 kg/m.  Constitutional:  oriented to person, place, and time. No distress.  HENT:  Head: Grossly normal Eyes:  no discharge. No scleral icterus.  Neck: No JVD, no carotid bruits  Cardiovascular: Regular rate and rhythm, no murmurs appreciated  Pulmonary/Chest: Clear to auscultation bilaterally, no wheezes or rails Abdominal: Soft.  no distension.  no tenderness.  Musculoskeletal: Normal range of motion Neurological:  normal muscle tone. Coordination normal. No atrophy Skin: Skin warm and dry Psychiatric: normal affect, pleasant  Recent Labs: 06/18/2022: B Natriuretic Peptide 112.2 01/12/2023: ALT 14 01/15/2023: BUN 9; Creatinine, Ser 0.62; Hemoglobin 11.5; Magnesium 1.9; Platelets 233; Potassium 3.9; Sodium 136    Lipid Panel No results found for: "CHOL", "HDL", "LDLCALC", "TRIG"    Wt Readings from Last 3 Encounters:  02/07/23 150 lb 8 oz (68.3 kg)  01/12/23 151 lb (68.5 kg)  08/06/22 155 lb 8 oz (70.5 kg)     ASSESSMENT AND PLAN:  Persistent atrial fibrillation History of sick sinus syndrome Permanent atrial fib On atenolol 12.5 prn for rate on Eliquis 5 BID Taking Lasix sparingly for ankle swelling  Leg swelling Component of dependent edema of right  leg Recommend compression hose  Hernia Recent hospitalization, inclined not to proceed with surgery and manage medically  Anxiety Managed by primary care stable  Hypothyroidism, unspecified type On thyroid supplement, stable  Hyperlipidemia Cholesterol 220 Does not want cholesterol medication  Uncomplicated asthma, unspecified asthma severity, unspecified whether persistent Followed by pulmonary, no recent exacerbations  Gait instability/risk of falls Prior history of falls recommended regular walking program   Total encounter time more than 30 minutes  Greater than 50% was spent in counseling and coordination of care with the patient   Orders Placed This Encounter  Procedures   EKG 12-Lead     Signed, Esmond Plants, M.D., Ph.D. 02/07/2023  [place], [place]

## 2023-02-07 ENCOUNTER — Encounter: Payer: Self-pay | Admitting: Cardiovascular Disease

## 2023-02-07 ENCOUNTER — Ambulatory Visit: Payer: Medicare Other | Attending: Cardiovascular Disease | Admitting: Cardiovascular Disease

## 2023-02-07 VITALS — BP 130/80 | HR 84 | Ht 60.0 in | Wt 150.5 lb

## 2023-02-07 DIAGNOSIS — M353 Polymyalgia rheumatica: Secondary | ICD-10-CM | POA: Diagnosis present

## 2023-02-07 DIAGNOSIS — R001 Bradycardia, unspecified: Secondary | ICD-10-CM

## 2023-02-07 DIAGNOSIS — I4892 Unspecified atrial flutter: Secondary | ICD-10-CM | POA: Diagnosis present

## 2023-02-07 DIAGNOSIS — I4819 Other persistent atrial fibrillation: Secondary | ICD-10-CM | POA: Diagnosis present

## 2023-02-07 DIAGNOSIS — R0609 Other forms of dyspnea: Secondary | ICD-10-CM | POA: Diagnosis present

## 2023-02-07 DIAGNOSIS — E782 Mixed hyperlipidemia: Secondary | ICD-10-CM | POA: Diagnosis present

## 2023-02-07 MED ORDER — FUROSEMIDE 20 MG PO TABS: 20.0000 mg | ORAL_TABLET | Freq: Every day | ORAL | 6 refills | Status: DC | PRN

## 2023-02-07 NOTE — Patient Instructions (Signed)
Medication Instructions:  No changes  If you need a refill on your cardiac medications before your next appointment, please call your pharmacy.   Lab work: No new labs needed  Testing/Procedures: No new testing needed  Follow-Up: At [facility], you and your health needs are our priority.  As part of our continuing mission to provide you with exceptional heart care, we have created designated Provider Care Teams.  These Care Teams include your primary Cardiologist (physician) and Advanced Practice Providers (APPs -  Physician Assistants and Nurse Practitioners) who all work together to provide you with the care you need, when you need it.  You will need a follow up appointment in 12 months  Providers on your designated Care Team:   Christopher Berge, NP Ryan Dunn, PA-C Cadence Furth, PA-C  COVID-19 Vaccine Information can be found at: [email or web address] For questions related to vaccine distribution or appointments, please email [email or web address] or call [phone].   

## 2023-03-21 ENCOUNTER — Ambulatory Visit: Payer: Medicare Other | Admitting: Cardiology

## 2023-04-02 ENCOUNTER — Other Ambulatory Visit: Payer: Self-pay | Admitting: Internal Medicine

## 2023-04-02 DIAGNOSIS — I4819 Other persistent atrial fibrillation: Secondary | ICD-10-CM

## 2023-04-04 NOTE — Telephone Encounter (Signed)
Per Margaretmary Dys, PharmD, pt's dose should be changed to Eliquis 5mg  BID.  Called and made pt aware of dose change. Pt verbalized understanding. Refill sent.

## 2023-04-04 NOTE — Telephone Encounter (Signed)
Prescription refill request for Eliquis received. Indication: Afib  Last office visit: 02/07/23 Mariah Milling)  Scr: 0.62 (01/15/23)  Age: 89 Weight: 68.3kg  Per dosing criteria, pt's dose is not appropriate. Will forward to pharmacy team to determine appropriate dose.

## 2023-04-04 NOTE — Telephone Encounter (Signed)
Not sure why she's stayed on this dose, it was refilled for lower dose last Nov too when she also qualified for 5mg  dosing.  Dr Windell Hummingbird note states "Still on eliquis 2.5 BID, dose dropped 9/22 with Dr Graciela Husbands, at that time weight was slightly above 60 kg Weight today 68 kg" and then his plan states continue Eliquis 5mg  BID which she isn't on.  Her weight has consistently been over 60kg and her renal function is normal, would recommend dose change to 5mg  BID.

## 2023-06-30 ENCOUNTER — Encounter: Payer: Self-pay | Admitting: Internal Medicine

## 2023-06-30 ENCOUNTER — Ambulatory Visit: Payer: Medicare Other | Attending: Cardiology | Admitting: Internal Medicine

## 2023-06-30 VITALS — BP 114/78 | HR 80 | Ht 60.0 in | Wt 140.4 lb

## 2023-06-30 DIAGNOSIS — I4819 Other persistent atrial fibrillation: Secondary | ICD-10-CM | POA: Diagnosis not present

## 2023-06-30 NOTE — Progress Notes (Signed)
Patient Care Team: Amanda Mina, MD as PCP - General (Family Medicine)   HPI  Amanda Dalton is a 89 y.o. female seen in follow-up for atrial fibrillation-PERMANENT anticoagulated with apixaban.   Other palpitations.  Bradycardia has been an issue requiring the discontinuation of atenolol and amiodarone   The patient denies chest pain,, nocturnal dyspnea, orthopnea or peripheral edema.  There have been no palpitations, lightheadedness or syncope.  Complains of some shortness of breath.  And life is just hard.  Her husband, #3, has had recurrent falls.  She met him, Amanda Dalton, as he was helping her following the death of husband #2.   No bleeding on apixaban Date Cr K Hgb TSH  5/19 0.83  12.4   12/19  0.8     3/20 0.8  13.9 2.28  10/20 0.8     2/21 0.86  14   9/21 0.83  12.7   9/22 0.79 4.2 12.5 1.606  3/23 0.8 4.6 13.9   4/24 0.8 4.0 12.9              Thromboembolic risk factors ( age -79, Gender-1 ) for a CHADSVASc Score of 3     . DATE TEST EF    1/18 Echo   50-55 % LAE mild PA 50-28mm                Records and Results Reviewed   Past Medical History:  Diagnosis Date   Anxiety    Asthma    Atrial flutter ([facility])    a. Dx 11/17/2016-->CHA2DS2VASc = 3-->Eliquis 5 mg BID.   Breast cancer ([facility])    a. 1996 s/p R modified radical mastectomy-->chemo w/ tamoxifen.   Endometrial cancer ([facility])    a. 08/2001 s/p hysterectomy. No node involvement.   Fracture of right humerus 11/2008   Hypothyroidism    Osteoporosis    Squamous cell carcinoma of skin 01/29/2010   Left superior side. KA, WD SCC not entirely excluded.    Past Surgical History:  Procedure Laterality Date   CARDIOVERSION N/A 04/19/2018   Procedure: CARDIOVERSION;  Surgeon: Amanda Iba, MD;  Location: [facility] ORS;  Service: Cardiovascular;  Laterality: N/A;   CARDIOVERSION N/A 01/18/2020   Procedure: CARDIOVERSION;  Surgeon: Amanda Iba, MD;  Location: [facility] ORS;  Service: Cardiovascular;   Laterality: N/A;   Right Mastectomy  1996   VAGINAL HYSTERECTOMY  08/2001    Current Meds  Medication Sig   albuterol (VENTOLIN HFA) 108 (90 Base) MCG/ACT inhaler Inhale 1 puff into the lungs every 4 (four) hours as needed.    apixaban (ELIQUIS) 5 MG TABS tablet Take 1 tablet (5 mg total) by mouth 2 (two) times daily.   ascorbic acid (VITAMIN C) 500 MG tablet Take 500 mg by mouth daily in the afternoon. Most days   atenolol (TENORMIN) 25 MG tablet Take 0.5 tablets (12.5 mg total) by mouth daily. Skip the dose if systolic BP <130 mmHg   Calcium Carb-Cholecalciferol (CALCIUM-VITAMIN D) 600-400 MG-UNIT TABS Take 1 tablet by mouth daily.   cetirizine (ZYRTEC) 10 MG tablet Take 1 tablet (10 mg total) by mouth daily.   citalopram (CELEXA) 40 MG tablet Take 1 tablet (40 mg total) by mouth daily.   furosemide (LASIX) 20 MG tablet Take 1 tablet (20 mg total) by mouth daily as needed for edema.   hydrALAZINE (APRESOLINE) 50 MG tablet Take 1 tablet (50 mg total) by mouth every 6 (six) hours as needed (SBP >  160).   ipratropium (ATROVENT) 0.06 % nasal spray Place 2 sprays into both nostrils 3 (three) times daily.   levothyroxine (SYNTHROID, LEVOTHROID) 75 MCG tablet Take 75 mcg by mouth daily before breakfast.   montelukast (SINGULAIR) 10 MG tablet Take 1 tablet (10 mg total) by mouth at bedtime.   Multiple Vitamin (MULTIVITAMIN WITH MINERALS) TABS tablet Take 1 tablet by mouth daily.   spironolactone (ALDACTONE) 25 MG tablet Take 25 mg by mouth daily.    Allergies  Allergen Reactions   Paroxetine Hcl Nausea Only and Nausea And Vomiting      Review of Systems negative except from HPI and PMH  Physical Exam BP 114/78 (BP Location: Left Arm, Patient Position: Sitting, Cuff Size: Normal)   Pulse 80   Ht 5' (1.524 m)   Wt 140 lb 6.4 oz (63.7 kg)   SpO2 97%   BMI 27.42 kg/m  Well developed and nourished in no acute distress HENT normal Neck supple with JVP-  flat  Clear Irregularly  Irregular rate and rhythm with controlled  ventricular response no murmurs or gallops Abd-soft with active BS No Clubbing cyanosis edema Skin-warm and dry A & Oriented  Grossly normal sensory and motor function  ECG atrial flutter-atypical with ventricular response at 80 -/08/39 Otherwise normal  ECG atrial flutter-atypical ventricular rate 74 ECG 9/22 sinus rhythm at about 60 with PACs ECG 3/22 atypical atrial flutter  Assessment and  Plan  Atrial fibrillation-PERMANENT  Dyspnea on exertion  COPD  Fall     No interval falls.  Dyspnea is stable the issue of SGLT2 has been defer to her primary care doctor to Dr. Knute Neu.  No bleeding on the Eliquis.  Will continue dose is still appropriate at 5 mg based on weight and renal function  Discussed the stresses of her husband's falling            Current medicines are reviewed at length with the patient today .  The patient does not  have concerns regarding medicines.

## 2023-06-30 NOTE — Patient Instructions (Signed)
Medication Instructions:  The current medical regimen is effective;  continue present plan and medications.  *If you need a refill on your cardiac medications before your next appointment, please call your pharmacy*   Follow-Up: At [facility], you and your health needs are our priority.  As part of our continuing mission to provide you with exceptional heart care, we have created designated Provider Care Teams.  These Care Teams include your primary Cardiologist (physician) and Advanced Practice Providers (APPs -  Physician Assistants and Nurse Practitioners) who all work together to provide you with the care you need, when you need it.  We recommend signing up for the patient portal called "MyChart".  Sign up information is provided on this After Visit Summary.  MyChart is used to connect with patients for Virtual Visits (Telemedicine).  Patients are able to view lab/test results, encounter notes, upcoming appointments, etc.  Non-urgent messages can be sent to your provider as well.   To learn more about what you can do with MyChart, go to [email or web address].    Your next appointment:   12 month(s)  Provider:   Sherryl Manges, MD

## 2023-07-06 ENCOUNTER — Other Ambulatory Visit: Payer: Self-pay | Admitting: Cardiovascular Disease

## 2023-07-06 DIAGNOSIS — I4819 Other persistent atrial fibrillation: Secondary | ICD-10-CM

## 2023-07-06 NOTE — Telephone Encounter (Signed)
Prescription refill request for Eliquis received. Indication: a fib Last office visit: 06/30/23 Scr: 0.62 01/15/23 epic Age: 89 Weight: 63 kg

## 2023-07-06 NOTE — Telephone Encounter (Signed)
Refill Request.  

## 2023-08-10 ENCOUNTER — Other Ambulatory Visit: Payer: Self-pay

## 2023-08-10 ENCOUNTER — Inpatient Hospital Stay
Admission: EM | Admit: 2023-08-10 | Discharge: 2023-08-13 | DRG: 394 | Disposition: A | Discharge status: Home Health | Payer: Medicare Other | Attending: Hospitalist | Admitting: Hospitalist

## 2023-08-10 ENCOUNTER — Inpatient Hospital Stay: Payer: Medicare Other

## 2023-08-10 ENCOUNTER — Emergency Department: Payer: Medicare Other

## 2023-08-10 DIAGNOSIS — Z7901 Long term (current) use of anticoagulants: Secondary | ICD-10-CM | POA: Diagnosis not present

## 2023-08-10 DIAGNOSIS — Z8542 Personal history of malignant neoplasm of other parts of uterus: Secondary | ICD-10-CM | POA: Diagnosis not present

## 2023-08-10 DIAGNOSIS — K56609 Unspecified intestinal obstruction, unspecified as to partial versus complete obstruction: Secondary | ICD-10-CM | POA: Diagnosis not present

## 2023-08-10 DIAGNOSIS — Z803 Family history of malignant neoplasm of breast: Secondary | ICD-10-CM

## 2023-08-10 DIAGNOSIS — Z85828 Personal history of other malignant neoplasm of skin: Secondary | ICD-10-CM | POA: Diagnosis not present

## 2023-08-10 DIAGNOSIS — E039 Hypothyroidism, unspecified: Secondary | ICD-10-CM | POA: Diagnosis present

## 2023-08-10 DIAGNOSIS — I482 Chronic atrial fibrillation, unspecified: Secondary | ICD-10-CM

## 2023-08-10 DIAGNOSIS — Z8262 Family history of osteoporosis: Secondary | ICD-10-CM | POA: Diagnosis not present

## 2023-08-10 DIAGNOSIS — R195 Other fecal abnormalities: Secondary | ICD-10-CM | POA: Diagnosis present

## 2023-08-10 DIAGNOSIS — I4821 Permanent atrial fibrillation: Secondary | ICD-10-CM | POA: Diagnosis present

## 2023-08-10 DIAGNOSIS — Z7951 Long term (current) use of inhaled steroids: Secondary | ICD-10-CM | POA: Diagnosis not present

## 2023-08-10 DIAGNOSIS — Z888 Allergy status to other drugs, medicaments and biological substances status: Secondary | ICD-10-CM

## 2023-08-10 DIAGNOSIS — Z853 Personal history of malignant neoplasm of breast: Secondary | ICD-10-CM | POA: Diagnosis not present

## 2023-08-10 DIAGNOSIS — Z9011 Acquired absence of right breast and nipple: Secondary | ICD-10-CM

## 2023-08-10 DIAGNOSIS — M810 Age-related osteoporosis without current pathological fracture: Secondary | ICD-10-CM | POA: Diagnosis present

## 2023-08-10 DIAGNOSIS — Z8249 Family history of ischemic heart disease and other diseases of the circulatory system: Secondary | ICD-10-CM

## 2023-08-10 DIAGNOSIS — K449 Diaphragmatic hernia without obstruction or gangrene: Secondary | ICD-10-CM | POA: Diagnosis present

## 2023-08-10 DIAGNOSIS — K436 Other and unspecified ventral hernia with obstruction, without gangrene: Principal | ICD-10-CM | POA: Diagnosis present

## 2023-08-10 DIAGNOSIS — I10 Essential (primary) hypertension: Secondary | ICD-10-CM | POA: Diagnosis present

## 2023-08-10 DIAGNOSIS — I4892 Unspecified atrial flutter: Secondary | ICD-10-CM | POA: Diagnosis present

## 2023-08-10 DIAGNOSIS — R1114 Bilious vomiting: Secondary | ICD-10-CM | POA: Diagnosis present

## 2023-08-10 DIAGNOSIS — Z79899 Other long term (current) drug therapy: Secondary | ICD-10-CM

## 2023-08-10 DIAGNOSIS — R54 Age-related physical debility: Secondary | ICD-10-CM | POA: Diagnosis present

## 2023-08-10 DIAGNOSIS — I4891 Unspecified atrial fibrillation: Secondary | ICD-10-CM | POA: Diagnosis not present

## 2023-08-10 DIAGNOSIS — Z79890 Hormone replacement therapy (postmenopausal): Secondary | ICD-10-CM | POA: Diagnosis not present

## 2023-08-10 DIAGNOSIS — Z823 Family history of stroke: Secondary | ICD-10-CM | POA: Diagnosis not present

## 2023-08-10 DIAGNOSIS — J4489 Other specified chronic obstructive pulmonary disease: Secondary | ICD-10-CM | POA: Diagnosis present

## 2023-08-10 DIAGNOSIS — F419 Anxiety disorder, unspecified: Secondary | ICD-10-CM | POA: Diagnosis present

## 2023-08-10 DIAGNOSIS — K922 Gastrointestinal hemorrhage, unspecified: Secondary | ICD-10-CM | POA: Diagnosis not present

## 2023-08-10 DIAGNOSIS — I4819 Other persistent atrial fibrillation: Secondary | ICD-10-CM | POA: Diagnosis present

## 2023-08-10 DIAGNOSIS — Z90710 Acquired absence of both cervix and uterus: Secondary | ICD-10-CM

## 2023-08-10 DIAGNOSIS — J45909 Unspecified asthma, uncomplicated: Secondary | ICD-10-CM | POA: Diagnosis present

## 2023-08-10 LAB — CBC
HCT: 40.5 % (ref 36.0–46.0)
Hemoglobin: 13.4 g/dL (ref 12.0–15.0)
MCH: 30.5 pg (ref 26.0–34.0)
MCHC: 33.1 g/dL (ref 30.0–36.0)
MCV: 92.3 fL (ref 80.0–100.0)
Platelets: 324 10*3/uL (ref 150–400)
RBC: 4.39 MIL/uL (ref 3.87–5.11)
RDW: 13.5 % (ref 11.5–15.5)
WBC: 8.4 10*3/uL (ref 4.0–10.5)
nRBC: 0 % (ref 0.0–0.2)

## 2023-08-10 LAB — COMPREHENSIVE METABOLIC PANEL
ALT: 15 U/L (ref 0–44)
AST: 20 U/L (ref 15–41)
Albumin: 3.6 g/dL (ref 3.5–5.0)
Alkaline Phosphatase: 35 U/L — ABNORMAL LOW (ref 38–126)
Anion gap: 10 (ref 5–15)
BUN: 13 mg/dL (ref 8–23)
CO2: 25 mmol/L (ref 22–32)
Calcium: 9.2 mg/dL (ref 8.9–10.3)
Chloride: 99 mmol/L (ref 98–111)
Creatinine, Ser: 0.63 mg/dL (ref 0.44–1.00)
GFR, Estimated: >60 mL/min (ref >60)
Glucose, Bld: 129 mg/dL — ABNORMAL HIGH (ref 70–99)
Potassium: 4 mmol/L (ref 3.5–5.1)
Sodium: 134 mmol/L — ABNORMAL LOW (ref 135–145)
Total Bilirubin: 1.3 mg/dL — ABNORMAL HIGH (ref 0.3–1.2)
Total Protein: 6.8 g/dL (ref 6.5–8.1)

## 2023-08-10 LAB — HEMOGLOBIN AND HEMATOCRIT, BLOOD
HCT: 37.2 % (ref 36.0–46.0)
Hemoglobin: 12 g/dL (ref 12.0–15.0)

## 2023-08-10 LAB — TYPE AND SCREEN
ABO/RH(D): A POS
Antibody Screen: NEGATIVE

## 2023-08-10 LAB — OCCULT BLOOD GASTRIC / DUODENUM (SPECIMEN CUP)
Occult Blood, Gastric: POSITIVE — AB
pH, Gastric: 4

## 2023-08-10 LAB — PROTIME-INR
INR: 1.4 — ABNORMAL HIGH (ref 0.8–1.2)
Prothrombin Time: 17 s — ABNORMAL HIGH (ref 11.4–15.2)

## 2023-08-10 LAB — APTT: aPTT: 33 s (ref 24–36)

## 2023-08-10 MED ORDER — SODIUM CHLORIDE 0.9 % IV BOLUS
500.0000 mL | Freq: Once | INTRAVENOUS | Status: AC
  Administered 2023-08-10: 500 mL via INTRAVENOUS

## 2023-08-10 MED ORDER — ONDANSETRON HCL 4 MG/2ML IJ SOLN: 4.0000 mg | Freq: Four times a day (QID) | INTRAMUSCULAR | Status: DC | PRN

## 2023-08-10 MED ORDER — PANTOPRAZOLE INFUSION (NEW) - SIMPLE MED
8.0000 mg/h | INTRAVENOUS | Status: DC
  Administered 2023-08-10 – 2023-08-12 (×2): 8 mg/h via INTRAVENOUS
  Filled 2023-08-10 (×3): qty 100

## 2023-08-10 MED ORDER — IOHEXOL 300 MG/ML  SOLN
100.0000 mL | Freq: Once | INTRAMUSCULAR | Status: AC | PRN
  Administered 2023-08-10: 100 mL via INTRAVENOUS

## 2023-08-10 MED ORDER — ONDANSETRON HCL 4 MG/2ML IJ SOLN
4.0000 mg | Freq: Once | INTRAMUSCULAR | Status: AC
  Administered 2023-08-10: 4 mg via INTRAVENOUS
  Filled 2023-08-10: qty 2

## 2023-08-10 MED ORDER — HEPARIN (PORCINE) 25000 UT/250ML-% IV SOLN: 800.0000 [IU]/h | INTRAVENOUS | Status: DC

## 2023-08-10 MED ORDER — SODIUM CHLORIDE 0.9 % IV SOLN: INTRAVENOUS | Status: DC

## 2023-08-10 MED ORDER — PANTOPRAZOLE SODIUM 40 MG IV SOLR
40.0000 mg | Freq: Once | INTRAVENOUS | Status: AC
  Administered 2023-08-10: 40 mg via INTRAVENOUS
  Filled 2023-08-10: qty 10

## 2023-08-10 MED ORDER — PANTOPRAZOLE SODIUM 40 MG IV SOLR: 40.0000 mg | Freq: Two times a day (BID) | INTRAVENOUS | Status: DC

## 2023-08-10 MED ORDER — METOPROLOL TARTRATE 5 MG/5ML IV SOLN
5.0000 mg | Freq: Four times a day (QID) | INTRAVENOUS | Status: DC
  Administered 2023-08-10 – 2023-08-12 (×7): 5 mg via INTRAVENOUS
  Filled 2023-08-10 (×7): qty 5

## 2023-08-10 MED ORDER — ONDANSETRON HCL 4 MG PO TABS: 4.0000 mg | ORAL_TABLET | Freq: Four times a day (QID) | ORAL | Status: DC | PRN

## 2023-08-10 MED ORDER — METOPROLOL TARTRATE 5 MG/5ML IV SOLN
5.0000 mg | INTRAVENOUS | Status: DC | PRN
  Administered 2023-08-10: 5 mg via INTRAVENOUS
  Filled 2023-08-10: qty 5

## 2023-08-10 MED ORDER — DILTIAZEM HCL 25 MG/5ML IV SOLN
10.0000 mg | Freq: Once | INTRAVENOUS | Status: AC
  Administered 2023-08-10: 10 mg via INTRAVENOUS
  Filled 2023-08-10: qty 5

## 2023-08-10 MED ORDER — PANTOPRAZOLE 80MG IVPB - SIMPLE MED
80.0000 mg | Freq: Once | INTRAVENOUS | Status: AC
  Administered 2023-08-11: 80 mg via INTRAVENOUS
  Filled 2023-08-10: qty 100

## 2023-08-10 NOTE — Assessment & Plan Note (Signed)
Sudden onset of generalized abdominal pain, recurrent nausea and vomiting since around 430 this morning CT imaging concerning for small bowel obstruction Noted admission February 2024 with similar issue in the setting of upper abdominal ventral hernia General Surgery consulted Pain control Antiemetics NG tube Follow-up recommendations

## 2023-08-10 NOTE — ED Triage Notes (Signed)
Pt arrives via [facility] from independent living at [facility] c/o black vomit that occurred last night and once this morning. Pt anticoagulated on Eliquis. She reports having a known abd hernia wearing abd binder. She reports having similar episode several months ago and was told it was bilious emesis.

## 2023-08-10 NOTE — Consult Note (Signed)
Pharmacy Consult Note - Anticoagulation  Pharmacy Consult for heparin Indication: atrial fibrillation  PATIENT MEASUREMENTS: Height: 5' (152.4 cm) Weight: 64.9 kg (143 lb) IBW/kg (Calculated) : 45.5 HEPARIN DW (KG): 59.3  VITAL SIGNS: Temp: 98.5 F (36.9 C) (09/11 1607) Temp Source: Oral (09/11 1212) BP: 134/87 (09/11 1607) Pulse Rate: 122 (09/11 1607)  Recent Labs    08/10/23 1218  HGB 13.4  HCT 40.5  PLT 324  CREATININE 0.63    Estimated Creatinine Clearance: 39.3 mL/min (by C-G formula based on SCr of 0.63 mg/dL).  PAST MEDICAL HISTORY: Past Medical History:  Diagnosis Date   Anxiety    Asthma    Atrial flutter (HCC)    a. Dx 11/17/2016-->CHA2DS2VASc = 3-->Eliquis 5 mg BID.   Breast cancer ([facility])    a. 1996 s/p R modified radical mastectomy-->chemo w/ tamoxifen.   Endometrial cancer ([facility])    a. 08/2001 s/p hysterectomy. No node involvement.   Fracture of right humerus 11/2008   Hypothyroidism    Osteoporosis    Squamous cell carcinoma of skin 01/29/2010   Left superior side. KA, WD SCC not entirely excluded.    ASSESSMENT: 89 y.o. female with PMH atrial fibrillation (on Eliquis), SBO, hypothyroidism, and MDD/GAD is presenting with hematemesis. Described as dark, with coffee ground consistency. Patient is on chronic anticoagulation per chart review for Afib and HR currently >120. CTAP notable for periumbilical ventral hernia with SBO. Pharmacy has been consulted to initiate and manage heparin intravenous infusion.  Pertinent medications: Apixaban 5 mg twice daily Last dose 08/10/2023 @ 0900  Goal(s) of therapy: Heparin level 0.3 - 0.7 units/mL Monitor platelets by anticoagulation protocol: Yes   Baseline anticoagulation labs: Recent Labs    08/10/23 1218  HGB 13.4  PLT 324    Date Time aPTT/HL Rate/Comment     PLAN: Last dose of apixaban on 08/10/2023 @ 0900 Start heparin infusion at 800 units/hour starting at 2100. Check aPTT in 8  hours. Continue to titrate by aPTT until heparin level and aPTT correlate and/or apixaban washes out, then titrate by heparin level alone. Continue to monitor CBC daily while on heparin infusion.  Will M. Dareen Piano, PharmD Clinical Pharmacist 08/10/2023 5:51 PM

## 2023-08-10 NOTE — ED Notes (Signed)
Informed RN bed assigned 

## 2023-08-10 NOTE — Progress Notes (Signed)
MEWS Progress Note  Patient Details Name: Amanda Dalton MRN: [id number] DOB: November 02, 1931 Today's Date: 08/10/2023   MEWS Flowsheet Documentation:  HR 124 on arrival to this unit Assess: MEWS Score Temp: 97.6 F (36.4 C) BP: (!) 143/98 MAP (mmHg): 111 Pulse Rate: 96 ECG Heart Rate: 96 Resp: 18 Level of Consciousness: Alert SpO2: 96 % O2 Device: Room Air Assess: MEWS Score MEWS Temp: 0 MEWS Systolic: 0 MEWS Pulse: 0 MEWS RR: 0 MEWS LOC: 0 MEWS Score: 0 MEWS Score Color: Green Assess: SIRS CRITERIA SIRS Temperature : 0 SIRS Respirations : 0 SIRS Pulse: 1 SIRS WBC: 0 SIRS Score Sum : 1 SIRS Temperature : 0 SIRS Pulse: 1 SIRS Respirations : 0 SIRS WBC: 0 SIRS Score Sum : 1 Assess: if the MEWS score is Yellow or Red Were vital signs accurate and taken at a resting state?: Yes Does the patient meet 2 or more of the SIRS criteria?: No MEWS guidelines implemented : Yes, yellow Treat MEWS Interventions: Considered administering scheduled or prn medications/treatments as ordered Take Vital Signs Increase Vital Sign Frequency : Yellow: Q2hr x1, continue Q4hrs until patient remains green for 12hrs Escalate MEWS: Escalate: Yellow: Discuss with charge nurse and consider notifying provider and/or RRT        Ernest Mallick 08/10/2023, 11:55 PM

## 2023-08-10 NOTE — Assessment & Plan Note (Signed)
Blood pressure mildly elevated. Patient was on atenolol and spironolactone at home. -Currently on IV metoprolol as n.p.o. -We will resume home meds once able to tolerate p.o.

## 2023-08-10 NOTE — Consult Note (Signed)
Date of Consultation:  08/10/2023  Requesting Physician:  Doree Albee, MD  Reason for Consultation:  Small bowel obstruction  History of Present Illness: Amanda Dalton is a 89 y.o. female presenting to the ED with abdominal pain associated with nausea and emesis.  Reports the emesis was dark color.  She was admitted in February 2024 with SBO and similar symptoms.  She reports her last BM was yesterday morning and no flatus yesterday or today.  In the ED, her lab workup was overall unremarkable.  She her a CT abdomen/pelvis which showed small bowel obstruction in the setting of a ventral hernia.  NG tube was placed in the ED.  She reports she feels a bit better now.  On her previous admission, Dr. Hazle Quant had talked with her about elective surgery for her hernia, but she was not interested due to some health conditions.  She has a history of asthma, atrial fibrillation/flutter and is on Eliquis.  She also has a history of breast cancer s/p right modified radical mastectomy in 1996 and endometrial cancer s/p hysterectomy in 2002.    Past Medical History: Past Medical History:  Diagnosis Date   Anxiety    Asthma    Atrial flutter (HCC)    a. Dx 11/17/2016-->CHA2DS2VASc = 3-->Eliquis 5 mg BID.   Breast cancer (HCC)    a. 1996 s/p R modified radical mastectomy-->chemo w/ tamoxifen.   Endometrial cancer (HCC)    a. 08/2001 s/p hysterectomy. No node involvement.   Fracture of right humerus 11/2008   Hypothyroidism    Osteoporosis    Squamous cell carcinoma of skin 01/29/2010   Left superior side. KA, WD SCC not entirely excluded.     Past Surgical History: Past Surgical History:  Procedure Laterality Date   CARDIOVERSION N/A 04/19/2018   Procedure: CARDIOVERSION;  Surgeon: Antonieta Iba, MD;  Location: [facility] ORS;  Service: Cardiovascular;  Laterality: N/A;   CARDIOVERSION N/A 01/18/2020   Procedure: CARDIOVERSION;  Surgeon: Antonieta Iba, MD;  Location: [facility] ORS;  Service:  Cardiovascular;  Laterality: N/A;   Right Mastectomy  1996   VAGINAL HYSTERECTOMY  08/2001    Home Medications: Prior to Admission medications   Medication Sig Start Date End Date Taking? Authorizing Provider  apixaban (ELIQUIS) 5 MG TABS tablet Take 1 tablet by mouth twice daily 07/06/23  Yes Gollan, Tollie Pizza, MD  ascorbic acid (VITAMIN C) 500 MG tablet Take 500 mg by mouth daily in the afternoon.   Yes [provider]  atenolol (TENORMIN) 25 MG tablet Take 0.5 tablets (12.5 mg total) by mouth daily. Skip the dose if systolic BP <130 mmHg 01/15/23  Yes Gillis Santa, MD  Calcium Carb-Cholecalciferol (CALCIUM-VITAMIN D) 600-400 MG-UNIT TABS Take 1 tablet by mouth daily. 01/15/23  Yes Gillis Santa, MD  cetirizine (ZYRTEC) 10 MG tablet Take 1 tablet (10 mg total) by mouth daily. 01/15/23  Yes Gillis Santa, MD  fluticasone-salmeterol (ADVAIR HFA) [phone] MCG/ACT inhaler Inhale 2 puffs into the lungs at bedtime as needed (asthma symptoms).   Yes [provider]  ipratropium (ATROVENT) 0.06 % nasal spray Place 2 sprays into both nostrils 3 (three) times daily as needed for rhinitis.   Yes [provider]  levothyroxine (SYNTHROID, LEVOTHROID) 75 MCG tablet Take 75 mcg by mouth daily before breakfast.   Yes [provider]  montelukast (SINGULAIR) 10 MG tablet Take 1 tablet (10 mg total) by mouth at bedtime. 01/15/23  Yes Gillis Santa, MD  Multiple Vitamin (MULTIVITAMIN  WITH MINERALS) TABS tablet Take 1 tablet by mouth daily.   Yes [provider]  spironolactone (ALDACTONE) 25 MG tablet Take 25 mg by mouth daily as needed (fluid retention).   Yes [provider]  citalopram (CELEXA) 40 MG tablet Take 1 tablet (40 mg total) by mouth daily. 01/15/23   Gillis Santa, MD  furosemide (LASIX) 20 MG tablet Take 1 tablet (20 mg total) by mouth daily as needed for edema. Patient not taking: Reported on 08/10/2023 02/07/23   Antonieta Iba, MD  hydrALAZINE  (APRESOLINE) 50 MG tablet Take 1 tablet (50 mg total) by mouth every 6 (six) hours as needed (SBP >160). Patient not taking: Reported on 08/10/2023 01/15/23   Gillis Santa, MD    Allergies: Allergies  Allergen Reactions   Paroxetine Hcl Nausea Only and Nausea And Vomiting    Social History:  reports that she has never smoked. She has never used smokeless tobacco. She reports current alcohol use of about 1.0 standard drink of alcohol per week. She reports that she does not use drugs.   Family History: Family History  Problem Relation Age of Onset   Breast cancer Sister    Osteoporosis Sister    Stroke Mother        died @ 70   Hypertension Mother    Stroke Father        died @ 33    Review of Systems: Review of Systems  Constitutional:  Negative for chills and fever.  HENT:  Negative for hearing loss.   Respiratory:  Negative for shortness of breath.   Cardiovascular:  Negative for chest pain.  Gastrointestinal:  Positive for abdominal pain, nausea and vomiting.  Genitourinary:  Negative for dysuria.  Musculoskeletal:  Negative for myalgias.  Skin:  Negative for rash.  Neurological:  Negative for dizziness.  Psychiatric/Behavioral:  Negative for depression.     Physical Exam BP (!) 148/107   Pulse (!) 112   Temp 98.5 F (36.9 C)   Resp (!) 24   Ht 5' (1.524 m)   Wt 64.9 kg   SpO2 95%   BMI 27.93 kg/m  CONSTITUTIONAL: No acute distress HEENT:  Normocephalic, atraumatic, extraocular motion intact. NECK: Trachea is midline, and there is no jugular venous distension. RESPIRATORY:  Normal respiratory effort without pathologic use of accessory muscles. CARDIOVASCULAR: Sinus tachycardia GI: The abdomen is soft, mildly distended, with some mild tenderness to palpation in the mid abdomen.  The patient's hernia contents are soft, without any overlying skin changes.  The hernia is reducible with some mild discomfort.  MUSCULOSKELETAL:  Normal muscle strength and tone in all  four extremities.  No peripheral edema or cyanosis. SKIN: Skin turgor is normal. There are no pathologic skin lesions.  NEUROLOGIC:  Motor and sensation is grossly normal.  Cranial nerves are grossly intact. PSYCH:  Alert and oriented to person, place and time. Affect is normal.  Laboratory Analysis: Results for orders placed or performed during the hospital encounter of 08/10/23 (from the past 24 hour(s))  Comprehensive metabolic panel     Status: Abnormal   Collection Time: 08/10/23 12:18 PM  Result Value Ref Range   Sodium 134 (L) 135 - 145 mmol/L   Potassium 4.0 3.5 - 5.1 mmol/L   Chloride 99 98 - 111 mmol/L   CO2 25 22 - 32 mmol/L   Glucose, Bld 129 (H) 70 - 99 mg/dL   BUN 13 8 - 23 mg/dL   Creatinine, Ser 0.62 0.44 -  1.00 mg/dL   Calcium 9.2 8.9 - 09.8 mg/dL   Total Protein 6.8 6.5 - 8.1 g/dL   Albumin 3.6 3.5 - 5.0 g/dL   AST 20 15 - 41 U/L   ALT 15 0 - 44 U/L   Alkaline Phosphatase 35 (L) 38 - 126 U/L   Total Bilirubin 1.3 (H) 0.3 - 1.2 mg/dL   GFR, Estimated >11 >91 mL/min   Anion gap 10 5 - 15  CBC     Status: None   Collection Time: 08/10/23 12:18 PM  Result Value Ref Range   WBC 8.4 4.0 - 10.5 K/uL   RBC 4.39 3.87 - 5.11 MIL/uL   Hemoglobin 13.4 12.0 - 15.0 g/dL   HCT 47.8 29.5 - 62.1 %   MCV 92.3 80.0 - 100.0 fL   MCH 30.5 26.0 - 34.0 pg   MCHC 33.1 30.0 - 36.0 g/dL   RDW 30.8 65.7 - 84.6 %   Platelets 324 150 - 400 K/uL   nRBC 0.0 0.0 - 0.2 %  Type and screen [facility]     Status: None   Collection Time: 08/10/23 12:18 PM  Result Value Ref Range   ABO/RH(D) A POS    Antibody Screen NEG    Sample Expiration      08/13/2023,2359 Performed at [facility], [address] [place], [place] [address]     Imaging: DG Abd Portable 1 View  Result Date: 08/10/2023 CLINICAL DATA:  NGT placement EXAM: PORTABLE ABDOMEN - 1 VIEW COMPARISON:  01/13/2023. FINDINGS: Image of the lower chest and upper abdomen demonstrates NG tube with  the tip at the GE junction and side port overlying the distal esophagus. The NG tube should be advanced about 8 cm. IMPRESSION: NG tube tip is at the GE junction and the tube should be advanced about 8 cm. Electronically Signed   By: Layla Maw M.D.   On: 08/10/2023 19:31   CT ABDOMEN PELVIS W CONTRAST  Result Date: 08/10/2023 CLINICAL DATA:  Vomiting. EXAM: CT ABDOMEN AND PELVIS WITH CONTRAST TECHNIQUE: Multidetector CT imaging of the abdomen and pelvis was performed using the standard protocol following bolus administration of intravenous contrast. RADIATION DOSE REDUCTION: This exam was performed according to the departmental dose-optimization program which includes automated exposure control, adjustment of the mA and/or kV according to patient size and/or use of iterative reconstruction technique. CONTRAST:  OMNIPAQUE IOHEXOL 300 MG/ML  SOLN COMPARISON:  January 12, 2023. FINDINGS: Lower chest: Visualized lung bases are unremarkable. Large sliding-type hiatal hernia is noted. Hepatobiliary: No cholelithiasis or biliary dilatation is noted. Probable focal fatty infiltration involving left hepatic lobe. Pancreas: Unremarkable. No pancreatic ductal dilatation or surrounding inflammatory changes. Spleen: Normal in size without focal abnormality. Adrenals/Urinary Tract: Adrenal glands appear normal. Large right renal cyst is noted. Smaller left renal cyst is noted. No further follow-up is required for these. No hydronephrosis or renal obstruction is noted. Urinary bladder is unremarkable. Stomach/Bowel: Stomach is unremarkable. Appendix appears normal. Moderate size periumbilical hernia is noted which contains loops of small bowel which results in obstruction of more proximal small bowel loops. No colonic dilatation is noted. Sigmoid diverticulosis is noted without inflammation. Vascular/Lymphatic: Aortic atherosclerosis. No enlarged abdominal or pelvic lymph nodes. Reproductive: Status post  hysterectomy. No adnexal masses. Other: No ascites is noted. Musculoskeletal: No acute or significant osseous findings. IMPRESSION: Periumbilical ventral hernia is again noted which contains loops of small bowel which results in obstruction of more proximal small bowel  loops. Sigmoid diverticulosis without inflammation. Large sliding-type hiatal hernia. Probable focal fatty infiltration in left hepatic lobe. Aortic Atherosclerosis (ICD10-I70.0). Electronically Signed   By: Lupita Raider M.D.   On: 08/10/2023 14:35    Assessment and Plan: This is a 89 y.o. female with small bowel obstruction  --Discussed with the patient the findings on her CT scan.  She has another episode of small bowel obstruction.  The transition point is right at the entrance of her ventral hernia. Her hernia on exam today is soft and is reducible.  Discussed with her that her SBO may be due to a combination of scar tissue from her hysterectomy in 2002 and her ventral hernia.  The hernia is located at the top of the scar from her hysterectomy so this may be an incisional hernia.  Discussed with her that for now we would attempt conservative management.  Agree with NG tube to suction, NPO diet, IV fluid hydration.  Will obtain KUB tomorrow to re-evaluate.  Discussed with her that she may need surgery this admission if there is no improvement.  Also discussed the possible option of outpatient surgery in the future to repair her hernia and perform lysis of adhesions.  Currently she's not interested in surgery but is open to having further discussion. --Will continue to follow with you.  I spent 45 minutes dedicated to the care of this patient on the date of this encounter to include pre-visit review of records, face-to-face time with the patient discussing diagnosis and management, and any post-visit coordination of care.   Howie Ill, MD [facility]:  [phone]

## 2023-08-10 NOTE — Assessment & Plan Note (Signed)
Stable from her stay standpoint Continue home inhalers

## 2023-08-10 NOTE — Assessment & Plan Note (Addendum)
Gastroccult + emesis in setting of SBO and ventral hernia .  Patient was on Eliquis Will hold eliquis  Hemoglobin at 11.8 -Monitor hemoglobin

## 2023-08-10 NOTE — ED Notes (Signed)
Holding protonix drip at this time until clarification of the order from providers. Information passed along to [place].

## 2023-08-10 NOTE — ED Notes (Signed)
NG tube advanced 8 cm.

## 2023-08-10 NOTE — ED Provider Notes (Signed)
[facility] Provider Note    Event Date/Time   First MD Initiated Contact with Patient 08/10/23 1223     (approximate)   History   Chief Complaint: Hematochezia   HPI  Amanda Dalton is a 89 y.o. female with a history of anxiety, hypothyroidism, SBO atrial fibrillation on Eliquis who was brought to the ED due to emesis since yesterday.  No fever.  Reports some vague lower abdominal pain.  No chest pain or shortness of breath.  No black or bloody stool.  Denies history of liver disease or peptic ulcer disease.     Physical Exam   Triage Vital Signs: ED Triage Vitals [08/10/23 1212]  Encounter Vitals Group     BP (!) 153/115     Systolic BP Percentile      Diastolic BP Percentile      Pulse Rate (!) 121     Resp 20     Temp 97.9 F (36.6 C)     Temp Source Oral     SpO2 96 %     Weight 143 lb (64.9 kg)     Height 5' (1.524 m)     Head Circumference      Peak Flow      Pain Score 3     Pain Loc      Pain Education      Exclude from Growth Chart     Most recent vital signs: Vitals:   08/10/23 1300 08/10/23 1330  BP: (!) 143/94 (!) 166/73  Pulse: (!) 120 (!) 121  Resp: 19 15  Temp:    SpO2: 95% 99%    General: Awake, no distress.  CV:  Good peripheral perfusion.  Tachycardia heart rate 120 Resp:  Normal effort.  Clear to auscultation bilaterally Abd:  No distention.  Soft with prominent ventral hernia, soft and nontender.  Abdomen is mildly distended Other:  Somewhat dry mucous membranes.  No lower extremity edema   ED Results / Procedures / Treatments   Labs (all labs ordered are listed, but only abnormal results are displayed) Labs Reviewed  COMPREHENSIVE METABOLIC PANEL - Abnormal; Notable for the following components:      Result Value   Sodium 134 (*)    Glucose, Bld 129 (*)    Alkaline Phosphatase 35 (*)    Total Bilirubin 1.3 (*)    All other components within normal limits  CBC  TYPE AND SCREEN      EKG    RADIOLOGY CT abdomen pelvis interpreted by me, shows dilated loops of bowel consistent with SBO.  Radiology report reviewed   PROCEDURES:  Procedures   MEDICATIONS ORDERED IN ED: Medications  sodium chloride 0.9 % bolus 500 mL (500 mLs Intravenous New Bag/Given 08/10/23 1318)  ondansetron (ZOFRAN) injection 4 mg (4 mg Intravenous Given 08/10/23 1321)  pantoprazole (PROTONIX) injection 40 mg (40 mg Intravenous Given 08/10/23 1319)  iohexol (OMNIPAQUE) 300 MG/ML solution 100 mL (100 mLs Intravenous Contrast Given 08/10/23 1347)     IMPRESSION / MDM / ASSESSMENT AND PLAN / ED COURSE  I reviewed the triage vital signs and the nursing notes.  DDx: Small bowel obstruction, gastritis, AKI, electrolyte abnormality, anemia  Patient's presentation is most consistent with acute presentation with potential threat to life or bodily function.  Patient brought to the ED with vomiting, found to have abdominal distention.  CT consistent with small bowel obstruction, recurrent from February 2024.  Will need to insert NG tube for decompression,  hospitalized for further management.    ----------------------------------------- 3:09 PM on 08/10/2023 ----------------------------------------- Case d/w hospitalist.      FINAL CLINICAL IMPRESSION(S) / ED DIAGNOSES   Final diagnoses:  SBO (small bowel obstruction) (HCC)  Chronic atrial fibrillation (HCC)     Rx / DC Orders   ED Discharge Orders     None        Note:  This document was prepared using Dragon voice recognition software and may include unintentional dictation errors.   Sharman Cheek, MD 08/10/23 [phone]

## 2023-08-10 NOTE — H&P (Addendum)
History and Physical    Patient: Amanda Dalton WUJ:[id number] DOB: [birth year] DOA: 08/10/2023 DOS: the patient was seen and examined on 08/10/2023 PCP: Jerl Mina, MD  Patient coming from: Home  Chief Complaint:  Chief Complaint  Patient presents with   Hematochezia   HPI: Amanda Dalton is a 89 y.o. female with medical history significant of asthma, atrial fibrillation, hypothyroidism, hypertension presenting with small bowel obstruction.  Patient reports sudden onset of severe generalized abdominal pain this morning with inability tolerate p.o. intake and recurrent nausea and vomiting.  Emesis fairly dark however denies any true coffee-ground emesis.  No chest pain or shortness of breath.  Noted admission in February for similar symptoms.  Denies any recent change in diet.  No fevers or chills.  No chest pain or shortness of breath. + dark emesis, though not grossly coffee ground. Per report, pt w/ prior episode assd w/ dark bilious vomiting.  Presented to the ER afebrile, heart rate 120s, blood pressure 140s to 160s over 70s to 90s.  Satting well on room air.  White count 8.4, hemoglobin 13.4, platelets 324.  Creatinine 0.63.  CT abdomen pelvis showing.  Periumbilical ventral hernia with small bowel obstruction.  Positive large sliding hiatal hernia. Review of Systems: As mentioned in the history of present illness. All other systems reviewed and are negative. Past Medical History:  Diagnosis Date   Anxiety    Asthma    Atrial flutter (HCC)    a. Dx 11/17/2016-->CHA2DS2VASc = 3-->Eliquis 5 mg BID.   Breast cancer (HCC)    a. 1996 s/p R modified radical mastectomy-->chemo w/ tamoxifen.   Endometrial cancer (HCC)    a. 08/2001 s/p hysterectomy. No node involvement.   Fracture of right humerus 11/2008   Hypothyroidism    Osteoporosis    Squamous cell carcinoma of skin 01/29/2010   Left superior side. KA, WD SCC not entirely excluded.   Past Surgical History:  Procedure Laterality  Date   CARDIOVERSION N/A 04/19/2018   Procedure: CARDIOVERSION;  Surgeon: Antonieta Iba, MD;  Location: [facility] ORS;  Service: Cardiovascular;  Laterality: N/A;   CARDIOVERSION N/A 01/18/2020   Procedure: CARDIOVERSION;  Surgeon: Antonieta Iba, MD;  Location: [facility] ORS;  Service: Cardiovascular;  Laterality: N/A;   Right Mastectomy  1996   VAGINAL HYSTERECTOMY  08/2001   Social History:  reports that she has never smoked. She has never used smokeless tobacco. She reports current alcohol use of about 1.0 standard drink of alcohol per week. She reports that she does not use drugs.  Allergies  Allergen Reactions   Paroxetine Hcl Nausea Only and Nausea And Vomiting    Family History  Problem Relation Age of Onset   Breast cancer Sister    Osteoporosis Sister    Stroke Mother        died @ 7   Hypertension Mother    Stroke Father        died @ 29    Prior to Admission medications   Medication Sig Start Date End Date Taking? Authorizing Provider  apixaban (ELIQUIS) 5 MG TABS tablet Take 1 tablet by mouth twice daily 07/06/23  Yes Gollan, Tollie Pizza, MD  ascorbic acid (VITAMIN C) 500 MG tablet Take 500 mg by mouth daily in the afternoon.   Yes [provider]  atenolol (TENORMIN) 25 MG tablet Take 0.5 tablets (12.5 mg total) by mouth daily. Skip the dose if systolic BP <130 mmHg 01/15/23  Yes Gillis Santa, MD  Calcium Carb-Cholecalciferol (CALCIUM-VITAMIN D) 600-400 MG-UNIT TABS Take 1 tablet by mouth daily. 01/15/23  Yes Gillis Santa, MD  cetirizine (ZYRTEC) 10 MG tablet Take 1 tablet (10 mg total) by mouth daily. 01/15/23  Yes Gillis Santa, MD  fluticasone-salmeterol (ADVAIR HFA) [phone] MCG/ACT inhaler Inhale 2 puffs into the lungs at bedtime as needed (asthma symptoms).   Yes [provider]  ipratropium (ATROVENT) 0.06 % nasal spray Place 2 sprays into both nostrils 3 (three) times daily as needed for rhinitis.   Yes [provider]  levothyroxine (SYNTHROID,  LEVOTHROID) 75 MCG tablet Take 75 mcg by mouth daily before breakfast.   Yes [provider]  montelukast (SINGULAIR) 10 MG tablet Take 1 tablet (10 mg total) by mouth at bedtime. 01/15/23  Yes Gillis Santa, MD  Multiple Vitamin (MULTIVITAMIN WITH MINERALS) TABS tablet Take 1 tablet by mouth daily.   Yes [provider]  spironolactone (ALDACTONE) 25 MG tablet Take 25 mg by mouth daily as needed (fluid retention).   Yes [provider]  citalopram (CELEXA) 40 MG tablet Take 1 tablet (40 mg total) by mouth daily. 01/15/23   Gillis Santa, MD  furosemide (LASIX) 20 MG tablet Take 1 tablet (20 mg total) by mouth daily as needed for edema. Patient not taking: Reported on 08/10/2023 02/07/23   Antonieta Iba, MD  hydrALAZINE (APRESOLINE) 50 MG tablet Take 1 tablet (50 mg total) by mouth every 6 (six) hours as needed (SBP >160). Patient not taking: Reported on 08/10/2023 01/15/23   Gillis Santa, MD    Physical Exam: Vitals:   08/10/23 1212 08/10/23 1300 08/10/23 1330 08/10/23 1607  BP: (!) 153/115 (!) 143/94 (!) 166/73 134/87  Pulse: (!) 121 (!) 120 (!) 121 (!) 122  Resp: 20 19 15 20   Temp: 97.9 F (36.6 C)   98.5 F (36.9 C)  TempSrc: Oral     SpO2: 96% 95% 99% 96%  Weight: 64.9 kg     Height: 5' (1.524 m)      Physical Exam Constitutional:      Appearance: She is normal weight.  HENT:     Head: Normocephalic and atraumatic.     Nose: Nose normal.     Mouth/Throat:     Mouth: Mucous membranes are moist.  Cardiovascular:     Rate and Rhythm: Normal rate and regular rhythm.     Pulses: Normal pulses.  Pulmonary:     Effort: Pulmonary effort is normal.  Abdominal:     Comments: + generalized abd pain    Musculoskeletal:     Cervical back: Normal range of motion.  Skin:    General: Skin is warm.  Neurological:     General: No focal deficit present.  Psychiatric:        Mood and Affect: Mood normal.     Data Reviewed:  There are no new results to  review at this time. CT ABDOMEN PELVIS W CONTRAST CLINICAL DATA:  Vomiting.  EXAM: CT ABDOMEN AND PELVIS WITH CONTRAST  TECHNIQUE: Multidetector CT imaging of the abdomen and pelvis was performed using the standard protocol following bolus administration of intravenous contrast.  RADIATION DOSE REDUCTION: This exam was performed according to the departmental dose-optimization program which includes automated exposure control, adjustment of the mA and/or kV according to patient size and/or use of iterative reconstruction technique.  CONTRAST:  OMNIPAQUE IOHEXOL 300 MG/ML  SOLN  COMPARISON:  January 12, 2023.  FINDINGS: Lower chest: Visualized lung bases are unremarkable. Large  sliding-type hiatal hernia is noted.  Hepatobiliary: No cholelithiasis or biliary dilatation is noted. Probable focal fatty infiltration involving left hepatic lobe.  Pancreas: Unremarkable. No pancreatic ductal dilatation or surrounding inflammatory changes.  Spleen: Normal in size without focal abnormality.  Adrenals/Urinary Tract: Adrenal glands appear normal. Large right renal cyst is noted. Smaller left renal cyst is noted. No further follow-up is required for these. No hydronephrosis or renal obstruction is noted. Urinary bladder is unremarkable.  Stomach/Bowel: Stomach is unremarkable. Appendix appears normal. Moderate size periumbilical hernia is noted which contains loops of small bowel which results in obstruction of more proximal small bowel loops. No colonic dilatation is noted. Sigmoid diverticulosis is noted without inflammation.  Vascular/Lymphatic: Aortic atherosclerosis. No enlarged abdominal or pelvic lymph nodes.  Reproductive: Status post hysterectomy. No adnexal masses.  Other: No ascites is noted.  Musculoskeletal: No acute or significant osseous findings.  IMPRESSION: Periumbilical ventral hernia is again noted which contains loops of small bowel which results  in obstruction of more proximal small bowel loops.  Sigmoid diverticulosis without inflammation.  Large sliding-type hiatal hernia.  Probable focal fatty infiltration in left hepatic lobe.  Aortic Atherosclerosis (ICD10-I70.0).  Electronically Signed   By: Lupita Raider M.D.   On: 08/10/2023 14:35  Lab Results  Component Value Date   WBC 8.4 08/10/2023   HGB 13.4 08/10/2023   HCT 40.5 08/10/2023   MCV 92.3 08/10/2023   PLT 324 08/10/2023   CT ABDOMEN PELVIS W CONTRAST CLINICAL DATA:  Vomiting.  EXAM: CT ABDOMEN AND PELVIS WITH CONTRAST  TECHNIQUE: Multidetector CT imaging of the abdomen and pelvis was performed using the standard protocol following bolus administration of intravenous contrast.  RADIATION DOSE REDUCTION: This exam was performed according to the departmental dose-optimization program which includes automated exposure control, adjustment of the mA and/or kV according to patient size and/or use of iterative reconstruction technique.  CONTRAST:  OMNIPAQUE IOHEXOL 300 MG/ML  SOLN  COMPARISON:  January 12, 2023.  FINDINGS: Lower chest: Visualized lung bases are unremarkable. Large sliding-type hiatal hernia is noted.  Hepatobiliary: No cholelithiasis or biliary dilatation is noted. Probable focal fatty infiltration involving left hepatic lobe.  Pancreas: Unremarkable. No pancreatic ductal dilatation or surrounding inflammatory changes.  Spleen: Normal in size without focal abnormality.  Adrenals/Urinary Tract: Adrenal glands appear normal. Large right renal cyst is noted. Smaller left renal cyst is noted. No further follow-up is required for these. No hydronephrosis or renal obstruction is noted. Urinary bladder is unremarkable.  Stomach/Bowel: Stomach is unremarkable. Appendix appears normal. Moderate size periumbilical hernia is noted which contains loops of small bowel which results in obstruction of more proximal small bowel loops.  No colonic dilatation is noted. Sigmoid diverticulosis is noted without inflammation.  Vascular/Lymphatic: Aortic atherosclerosis. No enlarged abdominal or pelvic lymph nodes.  Reproductive: Status post hysterectomy. No adnexal masses.  Other: No ascites is noted.  Musculoskeletal: No acute or significant osseous findings.  IMPRESSION: Periumbilical ventral hernia is again noted which contains loops of small bowel which results in obstruction of more proximal small bowel loops.  Sigmoid diverticulosis without inflammation.  Large sliding-type hiatal hernia.  Probable focal fatty infiltration in left hepatic lobe.  Aortic Atherosclerosis (ICD10-I70.0).  Electronically Signed   By: Lupita Raider M.D.   On: 08/10/2023 14:35  Lab Results  Component Value Date   WBC 8.4 08/10/2023   HGB 13.4 08/10/2023   HCT 40.5 08/10/2023   MCV 92.3 08/10/2023   PLT 324 08/10/2023  Last metabolic panel Lab Results  Component Value Date   GLUCOSE 129 (H) 08/10/2023   NA 134 (L) 08/10/2023   K 4.0 08/10/2023   CL 99 08/10/2023   CO2 25 08/10/2023   BUN 13 08/10/2023   CREATININE 0.63 08/10/2023   GFRNONAA >60 08/10/2023   CALCIUM 9.2 08/10/2023   PHOS 2.5 01/15/2023   PROT 6.8 08/10/2023   ALBUMIN 3.6 08/10/2023   BILITOT 1.3 (H) 08/10/2023   ALKPHOS 35 (L) 08/10/2023   AST 20 08/10/2023   ALT 15 08/10/2023   ANIONGAP 10 08/10/2023    Assessment and Plan: * UGIB (upper gastrointestinal bleed) Gastroccult + emesis in setting of SBO and ventral hernia  Will hold eliquis  Hgb stable at 13.5 Trend  Transfuse for hgb <7  IV PPI  GI consult in am     SBO (small bowel obstruction) (HCC) Sudden onset of generalized abdominal pain, recurrent nausea and vomiting since around 430 this morning CT imaging concerning for small bowel obstruction Noted admission February 2024 with similar issue in the setting of upper abdominal ventral hernia General Surgery consulted Pain  control Antiemetics NG tube Follow-up recommendations   Hypertension BP stable Titrate home regimen  Persistent/Permanent atrial fibrillation (HCC) On Eliquis Hold in setting of UGIB   Uncomplicated asthma Stable from her stay standpoint Continue home inhalers   Greater than 50% was spent in counseling and coordination of care with patient Total encounter time 80 minutes or more    Advance Care Planning:   Code Status: Full Code   Consults: General surgery   Family Communication: Husband at the bedside   Severity of Illness: The appropriate patient status for this patient is INPATIENT. Inpatient status is judged to be reasonable and necessary in order to provide the required intensity of service to ensure the patient's safety. The patient's presenting symptoms, physical exam findings, and initial radiographic and laboratory data in the context of their chronic comorbidities is felt to place them at high risk for further clinical deterioration. Furthermore, it is not anticipated that the patient will be medically stable for discharge from the hospital within 2 midnights of admission.   * I certify that at the point of admission it is my clinical judgment that the patient will require inpatient hospital care spanning beyond 2 midnights from the point of admission due to high intensity of service, high risk for further deterioration and high frequency of surveillance required.*  Author: Floydene Flock, MD 08/10/2023 5:13 PM  For on call review [email or web address].

## 2023-08-10 NOTE — Assessment & Plan Note (Addendum)
On Eliquis Hold in setting of UGIB

## 2023-08-11 ENCOUNTER — Other Ambulatory Visit: Payer: Self-pay

## 2023-08-11 ENCOUNTER — Inpatient Hospital Stay: Payer: Medicare Other

## 2023-08-11 DIAGNOSIS — K922 Gastrointestinal hemorrhage, unspecified: Secondary | ICD-10-CM

## 2023-08-11 DIAGNOSIS — I4819 Other persistent atrial fibrillation: Secondary | ICD-10-CM | POA: Diagnosis not present

## 2023-08-11 DIAGNOSIS — K56609 Unspecified intestinal obstruction, unspecified as to partial versus complete obstruction: Secondary | ICD-10-CM | POA: Diagnosis not present

## 2023-08-11 DIAGNOSIS — I10 Essential (primary) hypertension: Secondary | ICD-10-CM | POA: Diagnosis not present

## 2023-08-11 LAB — CBC
HCT: 36.3 % (ref 36.0–46.0)
Hemoglobin: 11.8 g/dL — ABNORMAL LOW (ref 12.0–15.0)
MCH: 30.1 pg (ref 26.0–34.0)
MCHC: 32.5 g/dL (ref 30.0–36.0)
MCV: 92.6 fL (ref 80.0–100.0)
Platelets: 295 10*3/uL (ref 150–400)
RBC: 3.92 MIL/uL (ref 3.87–5.11)
RDW: 13.9 % (ref 11.5–15.5)
WBC: 5.2 10*3/uL (ref 4.0–10.5)
nRBC: 0 % (ref 0.0–0.2)

## 2023-08-11 LAB — COMPREHENSIVE METABOLIC PANEL
ALT: 12 U/L (ref 0–44)
AST: 18 U/L (ref 15–41)
Albumin: 3.5 g/dL (ref 3.5–5.0)
Alkaline Phosphatase: 34 U/L — ABNORMAL LOW (ref 38–126)
Anion gap: 9 (ref 5–15)
BUN: 13 mg/dL (ref 8–23)
CO2: 24 mmol/L (ref 22–32)
Calcium: 8.2 mg/dL — ABNORMAL LOW (ref 8.9–10.3)
Chloride: 104 mmol/L (ref 98–111)
Creatinine, Ser: 0.71 mg/dL (ref 0.44–1.00)
GFR, Estimated: >60 mL/min (ref >60)
Glucose, Bld: 116 mg/dL — ABNORMAL HIGH (ref 70–99)
Potassium: 3.9 mmol/L (ref 3.5–5.1)
Sodium: 137 mmol/L (ref 135–145)
Total Bilirubin: 1.4 mg/dL — ABNORMAL HIGH (ref 0.3–1.2)
Total Protein: 6.1 g/dL — ABNORMAL LOW (ref 6.5–8.1)

## 2023-08-11 LAB — APTT: aPTT: 32 s (ref 24–36)

## 2023-08-11 MED ORDER — LEVOTHYROXINE SODIUM 50 MCG PO TABS
75.0000 ug | ORAL_TABLET | Freq: Every day | ORAL | Status: DC
  Administered 2023-08-12 – 2023-08-13 (×2): 75 ug
  Filled 2023-08-11 (×2): qty 2

## 2023-08-11 MED ORDER — METOPROLOL TARTRATE 5 MG/5ML IV SOLN
5.0000 mg | Freq: Once | INTRAVENOUS | Status: AC
  Administered 2023-08-11: 5 mg via INTRAVENOUS
  Filled 2023-08-11: qty 5

## 2023-08-11 MED ORDER — MONTELUKAST SODIUM 10 MG PO TABS
10.0000 mg | ORAL_TABLET | Freq: Every day | ORAL | Status: DC
  Administered 2023-08-11 – 2023-08-12 (×2): 10 mg
  Filled 2023-08-11 (×2): qty 1

## 2023-08-11 MED ORDER — FLUTICASONE FUROATE-VILANTEROL 100-25 MCG/ACT IN AEPB
1.0000 | INHALATION_SPRAY | Freq: Every day | RESPIRATORY_TRACT | Status: DC
  Administered 2023-08-11 – 2023-08-12 (×2): 1 via RESPIRATORY_TRACT
  Filled 2023-08-11: qty 28

## 2023-08-11 NOTE — Progress Notes (Signed)
Patient arrived on unit,patient in no distress,patient denies pain or discomfort at this time.nG tube connected to low intermittent suction.Telemetry box applied.Fall precautions in place.

## 2023-08-11 NOTE — Assessment & Plan Note (Signed)
-   Continue home Synthroid °

## 2023-08-11 NOTE — Plan of Care (Signed)

## 2023-08-11 NOTE — Progress Notes (Signed)
Progress Note   Patient: Amanda Dalton [id number]:[id number] DOB: May 22, 1931 DOA: 08/10/2023     1 DOS: the patient was seen and examined on 08/11/2023   Brief hospital course: Taken from H&P.  Amanda Dalton is a 89 y.o. female with medical history significant of asthma, atrial fibrillation on Eliquis, hypothyroidism, hypertension,breast cancer s/p right modified radical mastectomy in 1996 and endometrial cancer s/p hysterectomy in 2002, presenting with small bowel obstruction, concern of dark emesis. She was admitted in February 2024 with SBO and similar symptoms. She reports her last BM was yesterday morning and no flatus yesterday or today.   On presentation she was hemodynamically stable.  CT abdomen and pelvis with small bowel obstruction in the setting of her ventral hernia.  General surgery was consulted and NG tube was placed.  They will try conservative management initially but she will be high risk for surgical intervention.  9/12: Blood pressure mildly elevated at 153/87, labs with T. bili 1.4 which seems stable, hemoglobin 11.8, gastric occult blood was positive so Eliquis was held.  KUB today with persistently dilated loops of small bowel.  Hernia remains reducible and soft.    Assessment and Plan: * SBO (small bowel obstruction) (HCC) Partial small bowel obstruction , with a chronic ventral hernia.  Repeat KUB with persistent dilated small bowel, hernia remains reducible and soft. Patient has been suggested surgical intervention in the past but she declined. Still does not want surgery, general surgery is on board and are trying conservative management for now with NG tube and supportive care. If fails then she might have to go to operating room. Still no BM, hypoactive bowel sounds. -Continue with NG tube -Continue supportive care  UGIB (upper gastrointestinal bleed) Gastroccult + emesis in setting of SBO and ventral hernia .  Patient was on Eliquis Will hold eliquis  Hemoglobin  at 11.8 -Monitor hemoglobin    Persistent/Permanent atrial fibrillation (HCC) On Eliquis Hold in setting of UGIB   Hypertension Blood pressure mildly elevated. Patient was on atenolol and spironolactone at home. -Currently on IV metoprolol as n.p.o. -We will resume home meds once able to tolerate p.o.  Uncomplicated asthma Stable from her stay standpoint Continue home inhalers  Hypothyroidism -Continue home Synthroid   Subjective: Patient was seen and examined today.  No more nausea or vomiting, NG tube in place.  Had passed flatus just once earlier in the morning, no bowel movement yet  Physical Exam: Vitals:   08/11/23 0349 08/11/23 0454 08/11/23 0635 08/11/23 0744  BP: (!) 152/79 (!) 154/89 (!) 157/73 (!) 153/87  Pulse: 78 89 90 87  Resp: (!) 22 18  20   Temp: 98.7 F (37.1 C) 98.2 F (36.8 C)  98 F (36.7 C)  TempSrc:  Oral  Oral  SpO2: 96% 95%  96%  Weight:      Height:       General.  Frail elderly lady, in no acute distress. Pulmonary.  Lungs clear bilaterally, normal respiratory effort. CV.  Regular rate and rhythm, no JVD, rub or murmur. Abdomen.  Soft, nontender, nondistended, BS hypoactive CNS.  Alert and oriented .  No focal neurologic deficit. Extremities.  No edema, no cyanosis, pulses intact and symmetrical. Psychiatry.  Judgment and insight appears normal.   Data Reviewed: Prior data reviewed  Family Communication: Discussed with husband at bedside  Disposition: Status is: Inpatient Remains inpatient appropriate because: Severity of illness  Planned Discharge Destination: Home  Time spent: 45 minutes  This record has been  created using Conservation officer, historic buildings. Errors have been sought and corrected,but may not always be located. Such creation errors do not reflect on the standard of care.   Author: Arnetha Courser, MD 08/11/2023 12:09 PM  For on call review [email or web address].

## 2023-08-11 NOTE — Progress Notes (Signed)
[facility] SURGICAL PROGRESS NOTE (cpt [phone])  Hospital Day(s): 1.   Interval History: Patient seen and examined, no acute events or new complaints overnight. Patient reports she is feeling better. She did have some abdominal pain this AM; this was mild, and she "felt like she had to have a BM." She denies fever, chills, nausea. She remains without leukocytosis; WBC 5.2K Hgb to 11.8. Renal function normal; sCr - 0.71; UO - unmeasured x2. No electrolyte derangements. NGT output recorded at 100 ccs. She is passing flatus; No BM.  Review of Systems:  Constitutional: denies fever, chills  HEENT: denies cough or congestion  Respiratory: denies any shortness of breath  Cardiovascular: denies chest pain or palpitations  Gastrointestinal: + Abdominal pain (mild); denied N/V Genitourinary: denies burning with urination or urinary frequency Musculoskeletal: denies pain, decreased motor or sensation  Vital signs in last 24 hours: [min-max] current  Temp:  [97.6 F (36.4 C)-99.1 F (37.3 C)] 98.2 F (36.8 C) (09/12 0454) Pulse Rate:  [78-125] 90 (09/12 0635) Resp:  [15-25] 18 (09/12 0454) BP: (134-166)/(73-115) 157/73 (09/12 0635) SpO2:  [92 %-99 %] 95 % (09/12 0454) Weight:  [64.9 kg-66.4 kg] 66.4 kg (09/11 2225)     Height: 5' (152.4 cm) Weight: 66.4 kg BMI (Calculated): 28.59   Intake/Output last 2 shifts:  09/11 0701 - 09/12 0700 In: 1010.3 [I.V.:410.3; IV Piggyback:600] Out: 100 [Emesis/NG output:100]   Physical Exam:  Constitutional: alert, cooperative and no distress  HENT: normocephalic without obvious abnormality; NGT in place Eyes: PERRL, EOM's grossly intact and symmetric  Respiratory: breathing non-labored at rest  Cardiovascular: regular rate and sinus rhythm  Gastrointestinal: Soft, mild tenderness at ventral hernia, non-distended, no rebound/guarding. Ventral hernia is soft, reducible.  Musculoskeletal: no edema or wounds, motor and sensation grossly  intact, NT    Labs:     Latest Ref Rng & Units 08/11/2023    2:30 AM 08/10/2023    8:34 PM 08/10/2023   12:18 PM  CBC  WBC 4.0 - 10.5 K/uL 5.2   8.4   Hemoglobin 12.0 - 15.0 g/dL 60.4  54.0  98.1   Hematocrit 36.0 - 46.0 % 36.3  37.2  40.5   Platelets 150 - 400 K/uL 295   324       Latest Ref Rng & Units 08/11/2023    2:30 AM 08/10/2023   12:18 PM 01/15/2023    6:45 AM  CMP  Glucose 70 - 99 mg/dL 191  478  295   BUN 8 - 23 mg/dL 13  13  9    Creatinine 0.44 - 1.00 mg/dL 6.21  3.08  6.57   Sodium 135 - 145 mmol/L 137  134  136   Potassium 3.5 - 5.1 mmol/L 3.9  4.0  3.9   Chloride 98 - 111 mmol/L 104  99  105   CO2 22 - 32 mmol/L 24  25  24    Calcium 8.9 - 10.3 mg/dL 8.2  9.2  8.1   Total Protein 6.5 - 8.1 g/dL 6.1  6.8    Total Bilirubin 0.3 - 1.2 mg/dL 1.4  1.3    Alkaline Phos 38 - 126 U/L 34  35    AST 15 - 41 U/L 18  20    ALT 0 - 44 U/L 12  15       Imaging studies:   KUB (08/11/2023) personally reviewed still with dilated loops of small bowel, no free air, and radiologist report reviewed below:  IMPRESSION: 1. Interval advancement of an enteric tube courses below the hemidiaphragm with tip overlying the expected region the gastric lumen and side port overlying the expected region of the gastroesophageal junction. Recommend advancing by 3 cm. 2. Gaseous distension of several loops of small bowel.   Assessment/Plan: (ICD-10's: K78.609) 89 y.o. female with small bowel obstruction likely secondary to combination of post-surgical adhesive disease and known ventral hernia    - Will continue NGT decompression today; LIS; monitor and record output. If output remains low and she continues passing flatus, we can likely remove this tomorrow.  - No plans for emergent surgical intervention. She has been offered elective ventral hernia repair in the past and has refused, but is open to further discussions - NPO + IVF Resuscitation - Monitor abdominal examination; on-going  bowel function    - Pain control prn; antiemetics prn   - Mobilize   - Further management per primary service; we will follow    All of the above findings and recommendations were discussed with the patient, and the medical team, and all of patient's questions were answered to her expressed satisfaction.  -- Lynden Oxford, PA-C [facility] 08/11/2023, 7:41 AM M-F: 7am - 4pm

## 2023-08-11 NOTE — Care Management Important Message (Signed)
Important Message  Patient Details  Name: Amanda Dalton MRN: [id number] Date of Birth: 15-Aug-1931   Medicare Important Message Given:  N/A - LOS <3 / Initial given by admissions     Johnell Comings 08/11/2023, 4:56 PM

## 2023-08-11 NOTE — Evaluation (Signed)
Physical Therapy Evaluation Patient Details Name: Amanda Dalton MRN: [id number] DOB: Jan 27, 1931 Today's Date: 08/11/2023  History of Present Illness  89 y.o. female with medical history significant of asthma, atrial fibrillation, hypothyroidism, hypertension presenting with small bowel obstruction. Noted admission in February for similar symptoms. CT abdomen pelvis showing  Periumbilical ventral hernia with small bowel obstruction.  Positive large sliding hiatal hernia.  Clinical Impression  Pt pleasant and motivated with PT, showed good confidence with mobility and was able to ambulate ~100 ft with FWW (brief trial with single UE support/no UEs showed decreased confidence and safety).  Pt endorses only minimal fatigue, but HR crept up to and then sustained in the 120s, further activity deferred.  Pt will benefit from continued PT to address functional limitations and insure safe discharge.        If plan is discharge home, recommend the following: A little help with walking and/or transfers   Can travel by private vehicle        Equipment Recommendations Rolling walker (2 wheels)  Recommendations for Other Services       Functional Status Assessment       Precautions / Restrictions Precautions Precautions: Fall Precaution Comments: NG tube, hx large hiatal hernia Restrictions Weight Bearing Restrictions: No      Mobility  Bed Mobility Overal bed mobility: Modified Independent                  Transfers Overall transfer level: Needs assistance Equipment used: None Transfers: Sit to/from Stand Sit to Stand: Contact guard assist           General transfer comment: mildly unsteady with initial stand    Ambulation/Gait Ambulation/Gait assistance: Modified independent (Device/Increase time) Gait Distance (Feet): 100 Feet Assistive device: Rolling walker (2 wheels)         General Gait Details: Pt was able to ambulate with confident and consistent cadence,  light/appropriate use of walker. HR did increase to > 100 and then creep up to consistently staying in the 120s, deferred further ambulation at this point and returned to room.  trial of single UE use on rail and briefly w/o UE support with noted decrease in stability and confidence - returned to using walker after a few feet  Stairs            Wheelchair Mobility     Tilt Bed    Modified Rankin (Stroke Patients Only)       Balance Overall balance assessment: Needs assistance Sitting-balance support: No upper extremity supported, Feet supported Sitting balance-Leahy Scale: Good     Standing balance support: During functional activity, Reliant on assistive device for balance Standing balance-Leahy Scale: Fair Standing balance comment: fair+ with RW, fair with use of railing, fair- without AD                             Pertinent Vitals/Pain Pain Assessment Pain Assessment: No/denies pain    Home Living Family/patient expects to be discharged to:: Other (Comment) Living Arrangements: Spouse/significant other Available Help at Discharge: Family Type of Home: House Home Access: Level entry       Home Layout: One level Home Equipment: Rollator (4 wheels);Cane - single point;Grab bars - tub/shower;Grab bars - toilet      Prior Function Prior Level of Function : Independent/Modified Independent;Driving             Mobility Comments: ambulates with SPC PRN in the home, uses  primarily outside the home, uses rollator when doing laundry. Denies falls. ADLs Comments: Indep with basic ADL, med mgt, driving.     Extremity/Trunk Assessment   Upper Extremity Assessment Upper Extremity Assessment: Overall WFL for tasks assessed    Lower Extremity Assessment Lower Extremity Assessment: Overall WFL for tasks assessed       Communication   Communication Communication: Hearing impairment  Cognition Arousal: Alert Behavior During Therapy: WFL for tasks  assessed/performed Overall Cognitive Status: Within Functional Limits for tasks assessed                                          General Comments      Exercises     Assessment/Plan    PT Assessment    PT Problem List         PT Treatment Interventions      PT Goals (Current goals can be found in the Care Plan section)  Acute Rehab PT Goals Patient Stated Goal: go home PT Goal Formulation: With patient Time For Goal Achievement: 08/24/23 Potential to Achieve Goals: Good    Frequency Min 1X/week     Co-evaluation               AM-PAC PT "6 Clicks" Mobility  Outcome Measure Help needed turning from your back to your side while in a flat bed without using bedrails?: None Help needed moving from lying on your back to sitting on the side of a flat bed without using bedrails?: None Help needed moving to and from a bed to a chair (including a wheelchair)?: None Help needed standing up from a chair using your arms (e.g., wheelchair or bedside chair)?: None Help needed to walk in hospital room?: A Little Help needed climbing 3-5 steps with a railing? : A Little 6 Click Score: 22    End of Session Equipment Utilized During Treatment: Gait belt Activity Tolerance: Patient tolerated treatment well Patient left: with chair alarm set;with call bell/phone within reach Nurse Communication: Mobility status PT Visit Diagnosis: Unsteadiness on feet (R26.81);Muscle weakness (generalized) (M62.81)    Time: 1610-9604 PT Time Calculation (min) (ACUTE ONLY): 12 min   Charges:   PT Evaluation $PT Eval Low Complexity: 1 Low   PT General Charges $$ ACUTE PT VISIT: 1 Visit         Malachi Pro, DPT 08/11/2023, 1:39 PM

## 2023-08-11 NOTE — Evaluation (Signed)
Occupational Therapy Evaluation Patient Details Name: STACEYANN BORRESON MRN: [id number] DOB: [birth year] Today's Date: 08/11/2023   History of Present Illness 89 y.o. female with medical history significant of asthma, atrial fibrillation, hypothyroidism, hypertension presenting with small bowel obstruction. Noted admission in February for similar symptoms. CT abdomen pelvis showing  Periumbilical ventral hernia with small bowel obstruction.  Positive large sliding hiatal hernia.   Clinical Impression   Pt seen for OT evaluation this date. Pt pleasant, agreeable, and denies pain. Pt typically independent with ADL and IADL including med mgt and driving and uses a SPC primarily for mobility outside the home and only intermittently in the home. She [place] at [facility]. She denies falls in past 43mo. Pt demo'd modified indep with bed mobility, requiring CGA for initial STS from bed with slight posterior lean but able to adjust without direct assist. Pt required SBA for mobility with RW. HR >120 after ambulating ~80'. Recovers with seated rest break. Pt demonstrated difficulty with socks and cites her hiatal hernia as impeding her reach. Pt educated in AE for LB ADL to improve access and independence. Also educated in use of shower chair for seated showering to maximize safety and for energy conservation. Pt verbalized understanding. Pt continues to benefit from skilled OT services to maximize return to PLOF.      If plan is discharge home, recommend the following: A little help with bathing/dressing/bathroom    Functional Status Assessment  Patient has had a recent decline in their functional status and demonstrates the ability to make significant improvements in function in a reasonable and predictable amount of time.  Equipment Recommendations  Other (comment);Tub/shower seat (sock aide)    Recommendations for Other Services       Precautions / Restrictions  Precautions Precautions: Fall Precaution Comments: NG tube, hx large hiatal hernia Restrictions Weight Bearing Restrictions: No      Mobility Bed Mobility Overal bed mobility: Modified Independent                  Transfers Overall transfer level: Needs assistance Equipment used: None Transfers: Sit to/from Stand Sit to Stand: Contact guard assist           General transfer comment: mildly unsteady with initial stand      Balance Overall balance assessment: Needs assistance Sitting-balance support: No upper extremity supported, Feet supported Sitting balance-Leahy Scale: Good     Standing balance support: During functional activity, Reliant on assistive device for balance Standing balance-Leahy Scale: Fair Standing balance comment: fair+ wiht RW, fair with use of railing, fair- without AD                           ADL either performed or assessed with clinical judgement   ADL Overall ADL's : Needs assistance/impaired                     Lower Body Dressing: Sit to/from stand;Contact guard assist;Minimal assistance Lower Body Dressing Details (indicate cue type and reason): PRN MIN A For donning socks, CGA For all else. Pt notes her hernia impedes her reach for donning socks Toilet Transfer: Supervision/safety;Rolling walker (2 wheels)           Functional mobility during ADLs: Supervision/safety;Contact guard assist;Rolling walker (2 wheels)       Vision         Perception  Praxis         Pertinent Vitals/Pain Pain Assessment Pain Assessment: No/denies pain     Extremity/Trunk Assessment Upper Extremity Assessment Upper Extremity Assessment: Overall WFL for tasks assessed   Lower Extremity Assessment Lower Extremity Assessment: Overall WFL for tasks assessed       Communication Communication Communication: Hearing impairment   Cognition Arousal: Alert Behavior During Therapy: WFL for tasks  assessed/performed Overall Cognitive Status: Within Functional Limits for tasks assessed                                       General Comments  With ambulation >~80' HR reaches 125. Recovers with seated rest break.    Exercises Other Exercises Other Exercises: Pt educated in AE for LB ADL to improve access and independence   Shoulder Instructions      Home Living Family/patient expects to be discharged to:: Other (Comment) (independent living villa at [facility]) Living Arrangements: Spouse/significant other Available Help at Discharge: Family Type of Home: House (villa) Home Access: Level entry     Home Layout: One level     Bathroom Shower/Tub: Producer, television/film/video: Handicapped height     Home Equipment: Rollator (4 wheels);Cane - single point;Grab bars - tub/shower;Grab bars - toilet          Prior Functioning/Environment Prior Level of Function : Independent/Modified Independent;Driving             Mobility Comments: ambulates wiht SPC PRN in the home, uses primarily outside the home, uses rollator when doing laundry. Denies falls. ADLs Comments: Indep with basic ADL, med mgt, driving.        OT Problem List: Impaired balance (sitting and/or standing);Decreased activity tolerance;Cardiopulmonary status limiting activity;Decreased knowledge of use of DME or AE      OT Treatment/Interventions: Self-care/ADL training;Therapeutic exercise;Therapeutic activities;Energy conservation;DME and/or AE instruction;Patient/family education;Balance training    OT Goals(Current goals can be found in the care plan section) Acute Rehab OT Goals Patient Stated Goal: remain independent OT Goal Formulation: With patient Time For Goal Achievement: 08/25/23 Potential to Achieve Goals: Good ADL Goals Pt Will Perform Lower Body Dressing: with modified independence;with adaptive equipment;sit to/from stand Additional ADL Goal #1: Pt will verbalize  plan to implement at least 1 learned ECS into daily ADL/IADL Routines to improve safety and maintain independence without over exertion.  OT Frequency: Min 1X/week    Co-evaluation              AM-PAC OT "6 Clicks" Daily Activity     Outcome Measure Help from another person eating meals?: None Help from another person taking care of personal grooming?: None Help from another person toileting, which includes using toliet, bedpan, or urinal?: A Little Help from another person bathing (including washing, rinsing, drying)?: A Little Help from another person to put on and taking off regular upper body clothing?: None Help from another person to put on and taking off regular lower body clothing?: A Little 6 Click Score: 21   End of Session Equipment Utilized During Treatment: Gait belt;Rolling walker (2 wheels) Nurse Communication: Mobility status  Activity Tolerance: Patient tolerated treatment well Patient left: in chair;with call bell/phone within reach;with chair alarm set  OT Visit Diagnosis: Unsteadiness on feet (R26.81)                Time: 0830-0900 OT Time Calculation (min): 30 min Charges:  OT General Charges $OT Visit: 1 Visit OT Evaluation $OT Eval Low Complexity: 1 Low OT Treatments $Self Care/Home Management : 8-22 mins  Arman Filter., MPH, MS, OTR/L ascom [phone] 08/11/23, 9:30 AM

## 2023-08-11 NOTE — Progress Notes (Signed)
Patient transferred to Room 222-A. RN and NT transported the patient to her new room with all of her belongings to include her hearing aids (2), cell phone, glasses and clothing. Patient safety maintained during transport.

## 2023-08-11 NOTE — TOC Initial Note (Signed)
Transition of Care [facility]) - Initial/Assessment Note    Patient Details  Name: Amanda Dalton MRN: [id number] Date of Birth: 1931/09/08  Transition of Care [facility]) CM/SW Contact:    Chapman Fitch, RN Phone Number: 08/11/2023, 3:42 PM  Clinical Narrative:                  Admitted for: SBO.  With NG in place Admitted from: [facility] independent [facility] with husband  PCP: Burnett Sheng  : Current home health/prior home health/DME: Rollator and cane  Therapy recommending Home health.  Patient states that they were previously active with therapy on site at [facility].  VM left for Janci at [facility] to determine what orders they would need  Therapy recommending RW.  Patient states that she wishes to continue to use her rollator at discharge  Patient states at discharge [facility] transport, or friend will be available    Expected Discharge Plan: Home w Home Health Services     Patient Goals and CMS Choice            Expected Discharge Plan and Services                                              Prior Living Arrangements/Services                       Activities of Daily Living Home Assistive Devices/Equipment: Cane (specify quad or straight) ADL Screening (condition at time of admission) Patient's cognitive ability adequate to safely complete daily activities?: Yes Is the patient deaf or have difficulty hearing?: Yes Does the patient have difficulty seeing, even when wearing glasses/contacts?: No Does the patient have difficulty concentrating, remembering, or making decisions?: No Patient able to express need for assistance with ADLs?: Yes Does the patient have difficulty dressing or bathing?: No Independently performs ADLs?: Yes (appropriate for developmental age) Does the patient have difficulty walking or climbing stairs?: Yes Weakness of Legs: None Weakness of Arms/Hands: None  Permission Sought/Granted                  Emotional  Assessment              Admission diagnosis:  SBO (small bowel obstruction) (HCC) [K56.609] GIB (gastrointestinal bleeding) [K92.2] Chronic atrial fibrillation (HCC) [I48.20] Patient Active Problem List   Diagnosis Date Noted   UGIB (upper gastrointestinal bleed) 08/10/2023   Hypertension 08/10/2023   SBO (small bowel obstruction) (HCC) 01/12/2023   Persistent/Permanent atrial fibrillation (HCC) 04/04/2018   Encounter for preventive measure 02/17/2018   Hyperlipidemia 02/17/2018   Allergic rhinitis 01/23/2018   Atrial flutter with rapid ventricular response (HCC) 12/10/2016   Encounter for anticoagulation discussion and counseling 12/10/2016   Anxiety 12/10/2016   Hypothyroidism 12/10/2016   Abnormal CXR (chest x-ray) 02/23/2016   Uncomplicated asthma 02/23/2016   PCP:  Jerl Mina, MD Pharmacy:   [facility] [address] [place] - [address] [place] [address] Phone: [phone] Fax: [phone]     Social Determinants of Health (SDOH) Social History: SDOH Screenings   Food Insecurity: No Food Insecurity (08/11/2023)  Housing: Low Risk  (08/11/2023)  Transportation Needs: No Transportation Needs (08/11/2023)  Utilities: Not At Risk (08/11/2023)  Tobacco Use: Low Risk  (07/09/2023)   Received from [facility]   SDOH  Interventions:     Readmission Risk Interventions     No data to display

## 2023-08-11 NOTE — Hospital Course (Addendum)
Taken from H&P.  Amanda Dalton is a 89 y.o. female with medical history significant of asthma, atrial fibrillation on Eliquis, hypothyroidism, hypertension,breast cancer s/p right modified radical mastectomy in 1996 and endometrial cancer s/p hysterectomy in 2002, presenting with small bowel obstruction, concern of dark emesis. She was admitted in February 2024 with SBO and similar symptoms. She reports her last BM was yesterday morning and no flatus yesterday or today.   On presentation she was hemodynamically stable.  CT abdomen and pelvis with small bowel obstruction in the setting of her ventral hernia.  General surgery was consulted and NG tube was placed.  They will try conservative management initially but she will be high risk for surgical intervention.  9/12: Blood pressure mildly elevated at 153/87, labs with T. bili 1.4 which seems stable, hemoglobin 11.8, gastric occult blood was positive so Eliquis was held.  KUB today with persistently dilated loops of small bowel.  Hernia remains reducible and soft.

## 2023-08-12 ENCOUNTER — Encounter: Payer: Self-pay | Admitting: Family Medicine

## 2023-08-12 ENCOUNTER — Inpatient Hospital Stay: Payer: Medicare Other

## 2023-08-12 DIAGNOSIS — K56609 Unspecified intestinal obstruction, unspecified as to partial versus complete obstruction: Secondary | ICD-10-CM | POA: Diagnosis not present

## 2023-08-12 MED ORDER — SPIRONOLACTONE 25 MG PO TABS: 25.0000 mg | ORAL_TABLET | Freq: Every day | ORAL | Status: DC | PRN

## 2023-08-12 MED ORDER — ATENOLOL 25 MG PO TABS
12.5000 mg | ORAL_TABLET | Freq: Every day | ORAL | Status: DC
  Administered 2023-08-12 – 2023-08-13 (×2): 12.5 mg via ORAL
  Filled 2023-08-12 (×2): qty 1

## 2023-08-12 MED ORDER — APIXABAN 5 MG PO TABS
5.0000 mg | ORAL_TABLET | Freq: Two times a day (BID) | ORAL | Status: DC
  Administered 2023-08-12 – 2023-08-13 (×2): 5 mg via ORAL
  Filled 2023-08-12 (×2): qty 1

## 2023-08-12 NOTE — Progress Notes (Signed)
08/12/2023  Subjective: No acute events overnight.  Patient reports passing flatus still and denies any worsening pain.  KUB this morning looks better, less distended bowel with gas in colon.  Vital signs: Temp:  [97.6 F (36.4 C)-99 F (37.2 C)] 98.1 F (36.7 C) (09/13 0741) Pulse Rate:  [78-91] 83 (09/13 0741) Resp:  [19-20] 20 (09/13 0517) BP: (147-158)/(77-95) 155/95 (09/13 0741) SpO2:  [96 %-100 %] 97 % (09/13 0741)   Intake/Output: 09/12 0701 - 09/13 0700 In: 980 [I.V.:920; NG/GT:60] Out: 100 [Emesis/NG output:100] Last BM Date : 08/09/23  Physical Exam: Constitutional: No acute distress Abdomen:  soft, non-distended, non-tender.  Her ventral hernia remains reducible and soft.  Labs:  Recent Labs    08/10/23 1218 08/10/23 2034 08/11/23 0230  WBC 8.4  --  5.2  HGB 13.4 12.0 11.8*  HCT 40.5 37.2 36.3  PLT 324  --  295   Recent Labs    08/10/23 1218 08/11/23 0230  NA 134* 137  K 4.0 3.9  CL 99 104  CO2 25 24  GLUCOSE 129* 116*  BUN 13 13  CREATININE 0.63 0.71  CALCIUM 9.2 8.2*   Recent Labs    08/10/23 2136  LABPROT 17.0*  INR 1.4*    Imaging: No results found.  Assessment/Plan: This is a 89 y.o. female with resolving SBO  --Discussed with patient findings on her KUB today.  With her continued bowel function, low NG output, and improving KUB, will d/c NG tube today and start her on clears. --Could possibly advance to full liquids for dinner if she's doing well.   I spent 35 minutes dedicated to the care of this patient on the date of this encounter to include pre-visit review of records, face-to-face time with the patient discussing diagnosis and management, and any post-visit coordination of care.  Howie Ill, MD [facility]

## 2023-08-12 NOTE — Plan of Care (Signed)

## 2023-08-12 NOTE — Progress Notes (Signed)
PROGRESS NOTE    Amanda Dalton  JXB:[id number] DOB: 10/21/1931 DOA: 08/10/2023 PCP: Jerl Mina, MD  [id number]  LOS: 2 days   Brief hospital course:   Assessment & Plan: Amanda Dalton is a 89 y.o. female with medical history significant of asthma, atrial fibrillation on Eliquis, hypothyroidism, hypertension,breast cancer s/p right modified radical mastectomy in 1996 and endometrial cancer s/p hysterectomy in 2002, presenting with small bowel obstruction, concern of dark emesis. She was admitted in February 2024 with SBO and similar symptoms. She reports her last BM was yesterday morning and no flatus yesterday or today.    On presentation she was hemodynamically stable.  CT abdomen and pelvis with small bowel obstruction in the setting of her ventral hernia.   General surgery was consulted and NG tube was placed.  * SBO (small bowel obstruction) (HCC) Partial small bowel obstruction , with a chronic ventral hernia.  hernia remains reducible and soft. Patient has been suggested surgical intervention in the past but she declined. --NG tube removed today Plan: --advance diet as tolerated.   UGIB (upper gastrointestinal bleed), ruled out Gastroccult +, however, no signs of upper GI bleed.  Hemoglobin at 11.8  Persistent/Permanent atrial fibrillation (HCC) --resume home Eliquis   Hypertension --d/c IV metop --resume home atenolol and spironolactone    Uncomplicated asthma Stable  Continue home inhalers   Hypothyroidism -Continue home Synthroid    DVT prophylaxis: [id number] Code Status: Full code  Family Communication: family updated at bedside today Level of care: Telemetry Medical Dispo:   The patient is from: home Anticipated d/c is to: home Anticipated d/c date is: likely tomorrow    Subjective and Interval History:  Abdominal pain improved.  Pt tolerated clear liquid diet.   Objective: Vitals:   08/12/23 0030 08/12/23 0517 08/12/23 0741 08/12/23 1619   BP: (!) 147/85 (!) 154/77 (!) 155/95 (!) 142/73  Pulse: 91 78 83   Resp: 20 20    Temp: 99 F (37.2 C) 98.7 F (37.1 C) 98.1 F (36.7 C) 98 F (36.7 C)  TempSrc: Oral Oral Oral Oral  SpO2: 97% 98% 97% 99%  Weight:      Height:        Intake/Output Summary (Last 24 hours) at 08/12/2023 1752 Last data filed at 08/12/2023 1500 Gross per 24 hour  Intake 1940 ml  Output 100 ml  Net 1840 ml   Filed Weights   08/10/23 1212 08/10/23 2225  Weight: 64.9 kg 66.4 kg    Examination:   Constitutional: NAD, AAOx3 HEENT: conjunctivae and lids normal, EOMI CV: No cyanosis.   RESP: normal respiratory effort, on RA Neuro: II - XII grossly intact.   Psych: Normal mood and affect.  Appropriate judgement and reason   Data Reviewed: I have personally reviewed labs and imaging studies  Time spent: 35 minutes  Darlin Priestly, MD [facility] If 7PM-7AM, please contact night-coverage 08/12/2023, 5:52 PM

## 2023-08-12 NOTE — Progress Notes (Signed)
Occupational Therapy Treatment Patient Details Name: Amanda Dalton MRN: [id number] DOB: 01/15/1931 Today's Date: 08/12/2023   History of present illness 89 y.o. female with medical history significant of asthma, atrial fibrillation, hypothyroidism, hypertension presenting with small bowel obstruction. Noted admission in February for similar symptoms. CT abdomen pelvis showing  Periumbilical ventral hernia with small bowel obstruction.  Positive large sliding hiatal hernia.   OT comments  Pt seen for OT tx. Pt agreeable, denies complaints. Pt engaged in exertional activity while monitoring HR and OT educated in energy conservation strategies . Pt educated in activity pacing and rest breaks to minimize over exertion. Encouraged to utilize pulse oximeter she has at home to monitor. Pt progressing well towards goals.       If plan is discharge home, recommend the following:  A little help with bathing/dressing/bathroom   Equipment Recommendations  Other (comment);Tub/shower seat (sock aide)    Recommendations for Other Services      Precautions / Restrictions Precautions Precautions: Fall Precaution Comments: NG tube removed 9/13, hx large hiatal hernia Restrictions Weight Bearing Restrictions: No       Mobility Bed Mobility                    Transfers Overall transfer level: Needs assistance Equipment used: Rolling walker (2 wheels) Transfers: Sit to/from Stand Sit to Stand: Supervision                 Balance Overall balance assessment: Needs assistance Sitting-balance support: No upper extremity supported, Feet supported Sitting balance-Leahy Scale: Good     Standing balance support: During functional activity, Reliant on assistive device for balance Standing balance-Leahy Scale: Fair Standing balance comment: HR up to 105 with exertional activity, improves quickly with rest breaks                           ADL either performed or assessed with  clinical judgement   ADL                                              Extremity/Trunk Assessment              Vision       Perception     Praxis      Cognition Arousal: Alert Behavior During Therapy: WFL for tasks assessed/performed Overall Cognitive Status: Within Functional Limits for tasks assessed                                          Exercises Other Exercises Other Exercises: Pt educated in activity pacing and rest breaks to minimize over exertion. Encouraged to utilize pulse oximeter she has at home to monitor    Shoulder Instructions       General Comments      Pertinent Vitals/ Pain       Pain Assessment Pain Assessment: No/denies pain  Home Living                                          Prior Functioning/Environment              Frequency  Min 1X/week        Progress Toward Goals  OT Goals(current goals can now be found in the care plan section)  Progress towards OT goals: Progressing toward goals  Acute Rehab OT Goals Patient Stated Goal: remain independent OT Goal Formulation: With patient Time For Goal Achievement: 08/25/23 Potential to Achieve Goals: Good  Plan      Co-evaluation                 AM-PAC OT "6 Clicks" Daily Activity     Outcome Measure   Help from another person eating meals?: None Help from another person taking care of personal grooming?: None Help from another person toileting, which includes using toliet, bedpan, or urinal?: A Little Help from another person bathing (including washing, rinsing, drying)?: A Little Help from another person to put on and taking off regular upper body clothing?: None Help from another person to put on and taking off regular lower body clothing?: A Little 6 Click Score: 21    End of Session Equipment Utilized During Treatment: Rolling walker (2 wheels)  OT Visit Diagnosis: Unsteadiness on feet (R26.81)    Activity Tolerance Patient tolerated treatment well   Patient Left in chair;with call bell/phone within reach   Nurse Communication          Time: 2956-2130 OT Time Calculation (min): 26 min  Charges: OT General Charges $OT Visit: 1 Visit OT Treatments $Therapeutic Activity: 23-37 mins  Arman Filter., MPH, MS, OTR/L ascom [phone] 08/12/23, 4:55 PM

## 2023-08-12 NOTE — Progress Notes (Signed)
NGT removed without complications.  Cornell Barman Chemere Steffler

## 2023-08-13 ENCOUNTER — Encounter: Payer: Self-pay | Admitting: Family Medicine

## 2023-08-13 DIAGNOSIS — K56609 Unspecified intestinal obstruction, unspecified as to partial versus complete obstruction: Secondary | ICD-10-CM | POA: Diagnosis not present

## 2023-08-13 NOTE — Plan of Care (Signed)
Adequate for discharge today

## 2023-08-13 NOTE — Discharge Summary (Signed)
Physician Discharge Summary   Amanda Dalton  female DOB: 10-03-1931  ZOX:[id number]  PCP: Jerl Mina, MD  Admit date: 08/10/2023 Discharge date: 08/13/2023  Admitted From: home Disposition:  home CODE STATUS: Full code   Hospital Course:  For full details, please see H&P, progress notes, consult notes and ancillary notes.  Briefly,  Amanda Dalton is a 89 y.o. female with medical history significant of asthma, atrial fibrillation on Eliquis, hypothyroidism, hypertension, breast cancer s/p right modified radical mastectomy in 1996 and endometrial cancer s/p hysterectomy in 2002, presenting with small bowel obstruction, concern of dark emesis.  She was admitted in February 2024 with SBO and similar symptoms.    On presentation she was hemodynamically stable.  CT abdomen and pelvis with small bowel obstruction in the setting of her ventral hernia.  General surgery was consulted and NG tube was placed.   * SBO (small bowel obstruction) (HCC) Partial small bowel obstruction, with a chronic ventral hernia.  hernia remains reducible and soft. Patient has been suggested surgical intervention in the past but she declined. --NG tube removed and pt was able to advance her diet to soft foods prior to discharge.   UGIB, ruled out Gastroccult +, however, no signs of upper GI bleed.  Hemoglobin at 11.8   Persistent/Permanent atrial fibrillation (HCC) --cont home Eliquis   Hypertension --pt was not taking Lasix and hydralazine PTA. --cont home atenolol and spironolactone    Uncomplicated asthma Stable  Continue home inhalers   Hypothyroidism -Continue home Synthroid   Discharge Diagnoses:  Principal Problem:   SBO (small bowel obstruction) (HCC) Active Problems:   UGIB (upper gastrointestinal bleed)   Persistent/Permanent atrial fibrillation (HCC)   Hypertension   Uncomplicated asthma   Hypothyroidism   30 Day Unplanned Readmission Risk Score    Flowsheet Row ED to  Hosp-Admission (Current) from 08/10/2023 in [facility]  30 Day Unplanned Readmission Risk Score (%) 14.72 Filed at 08/13/2023 0800       This score is the patient's risk of an unplanned readmission within 30 days of being discharged (0 -100%). The score is based on dignosis, age, lab data, medications, orders, and past utilization.   Low:  0-14.9   Medium: 15-21.9   High: 22-29.9   Extreme: 30 and above         Discharge Instructions:  Allergies as of 08/13/2023       Reactions   Paroxetine Hcl Nausea Only, Nausea And Vomiting        Medication List     STOP taking these medications    furosemide 20 MG tablet Commonly known as: LASIX   hydrALAZINE 50 MG tablet Commonly known as: APRESOLINE       TAKE these medications    ascorbic acid 500 MG tablet Commonly known as: VITAMIN C Take 500 mg by mouth daily in the afternoon.   atenolol 25 MG tablet Commonly known as: TENORMIN Take 0.5 tablets (12.5 mg total) by mouth daily. Skip the dose if systolic BP <130 mmHg   Calcium-Vitamin D 600-400 MG-UNIT Tabs Take 1 tablet by mouth daily.   cetirizine 10 MG tablet Commonly known as: ZYRTEC Take 1 tablet (10 mg total) by mouth daily.   citalopram 40 MG tablet Commonly known as: CELEXA Take 1 tablet (40 mg total) by mouth daily.   Eliquis 5 MG Tabs tablet Generic drug: apixaban Take 1 tablet by mouth twice daily   fluticasone-salmeterol 45-21 MCG/ACT inhaler Commonly known  as: ADVAIR HFA Inhale 2 puffs into the lungs at bedtime as needed (asthma symptoms).   ipratropium 0.06 % nasal spray Commonly known as: ATROVENT Place 2 sprays into both nostrils 3 (three) times daily as needed for rhinitis.   levothyroxine 75 MCG tablet Commonly known as: SYNTHROID Take 75 mcg by mouth daily before breakfast.   montelukast 10 MG tablet Commonly known as: SINGULAIR Take 1 tablet (10 mg total) by mouth at bedtime.   multivitamin  with minerals Tabs tablet Take 1 tablet by mouth daily.   spironolactone 25 MG tablet Commonly known as: ALDACTONE Take 25 mg by mouth daily as needed (fluid retention).         Follow-up Information     Jerl Mina, MD Follow up in 1 week(s).   Specialty: Family Medicine Contact information: [address] [facility] [place] [address] [phone]                 Allergies  Allergen Reactions   Paroxetine Hcl Nausea Only and Nausea And Vomiting     The results of significant diagnostics from this hospitalization (including imaging, microbiology, ancillary and laboratory) are listed below for reference.   Consultations:   Procedures/Studies: DG Abd 2 Views  Result Date: 08/12/2023 CLINICAL DATA:  Small-bowel obstruction EXAM: ABDOMEN - 2 VIEW COMPARISON:  Abdominal radiograph 08/11/2023, CT abdomen and pelvis 08/10/2023 FINDINGS: Cardiomegaly. No pleural effusion. No pneumothorax. Bibasilar atelectasis. Enteric tube courses below diaphragm with the side hole above the GE junction in the tip projecting over the expected location of the stomach. Paucity of small bowel gas limits the ability to assess for obstruction. Surgical clips over the lower abdomen and pelvis. Pelvic phleboliths. Assessment of the sacrum is limited due to overlying bowel gas. No acute osseous abnormality. IMPRESSION: 1. Paucity of small bowel gas limits the ability to assess for obstruction. 2. Enteric tube courses below diaphragm with the side hole above the GE junction in the tip projecting over the expected location of the stomach. Recommend advancement. Electronically Signed   By: Lorenza Cambridge M.D.   On: 08/12/2023 09:38   DG Abd 2 Views  Result Date: 08/11/2023 CLINICAL DATA:  [id number] SBO (small bowel obstruction) (HCC) [id number] EXAM: ABDOMEN - 2 VIEW COMPARISON:  X-ray abdomen 08/10/2023, CT abdomen pelvis 08/10/2023, x-ray abdomen 01/13/2023 FINDINGS: Interval advancement of an  enteric tube courses below the hemidiaphragm with tip overlying the expected region the gastric lumen and side port overlying the expected region of the gastroesophageal junction. Gaseous distension of several loops of small bowel. There is no evidence of free air. Surgical and vascular clips overlie the lower abdomen and bilateral pelvic-consistent with lymph node dissection. Rounded density overlying the expected region of the right kidney of unclear etiology with no nephrolithiasis correlate on CT abdomen pelvis 08/10/2023-incidental and benign. IMPRESSION: 1. Interval advancement of an enteric tube courses below the hemidiaphragm with tip overlying the expected region the gastric lumen and side port overlying the expected region of the gastroesophageal junction. Recommend advancing by 3 cm. 2. Gaseous distension of several loops of small bowel. Electronically Signed   By: Tish Frederickson M.D.   On: 08/11/2023 05:44   DG Abd Portable 1 View  Result Date: 08/10/2023 CLINICAL DATA:  NGT placement EXAM: PORTABLE ABDOMEN - 1 VIEW COMPARISON:  01/13/2023. FINDINGS: Image of the lower chest and upper abdomen demonstrates NG tube with the tip at the GE junction and side port overlying the distal esophagus. The NG  tube should be advanced about 8 cm. IMPRESSION: NG tube tip is at the GE junction and the tube should be advanced about 8 cm. Electronically Signed   By: Layla Maw M.D.   On: 08/10/2023 19:31   CT ABDOMEN PELVIS W CONTRAST  Result Date: 08/10/2023 CLINICAL DATA:  Vomiting. EXAM: CT ABDOMEN AND PELVIS WITH CONTRAST TECHNIQUE: Multidetector CT imaging of the abdomen and pelvis was performed using the standard protocol following bolus administration of intravenous contrast. RADIATION DOSE REDUCTION: This exam was performed according to the departmental dose-optimization program which includes automated exposure control, adjustment of the mA and/or kV according to patient size and/or use of iterative  reconstruction technique. CONTRAST:  OMNIPAQUE IOHEXOL 300 MG/ML  SOLN COMPARISON:  January 12, 2023. FINDINGS: Lower chest: Visualized lung bases are unremarkable. Large sliding-type hiatal hernia is noted. Hepatobiliary: No cholelithiasis or biliary dilatation is noted. Probable focal fatty infiltration involving left hepatic lobe. Pancreas: Unremarkable. No pancreatic ductal dilatation or surrounding inflammatory changes. Spleen: Normal in size without focal abnormality. Adrenals/Urinary Tract: Adrenal glands appear normal. Large right renal cyst is noted. Smaller left renal cyst is noted. No further follow-up is required for these. No hydronephrosis or renal obstruction is noted. Urinary bladder is unremarkable. Stomach/Bowel: Stomach is unremarkable. Appendix appears normal. Moderate size periumbilical hernia is noted which contains loops of small bowel which results in obstruction of more proximal small bowel loops. No colonic dilatation is noted. Sigmoid diverticulosis is noted without inflammation. Vascular/Lymphatic: Aortic atherosclerosis. No enlarged abdominal or pelvic lymph nodes. Reproductive: Status post hysterectomy. No adnexal masses. Other: No ascites is noted. Musculoskeletal: No acute or significant osseous findings. IMPRESSION: Periumbilical ventral hernia is again noted which contains loops of small bowel which results in obstruction of more proximal small bowel loops. Sigmoid diverticulosis without inflammation. Large sliding-type hiatal hernia. Probable focal fatty infiltration in left hepatic lobe. Aortic Atherosclerosis (ICD10-I70.0). Electronically Signed   By: Lupita Raider M.D.   On: 08/10/2023 14:35      Labs: BNP (last 3 results) No results for input(s): "BNP" in the last 8760 hours. Basic Metabolic Panel: Recent Labs  Lab 08/10/23 1218 08/11/23 0230  NA 134* 137  K 4.0 3.9  CL 99 104  CO2 25 24  GLUCOSE 129* 116*  BUN 13 13  CREATININE 0.63 0.71  CALCIUM  9.2 8.2*   Liver Function Tests: Recent Labs  Lab 08/10/23 1218 08/11/23 0230  AST 20 18  ALT 15 12  ALKPHOS 35* 34*  BILITOT 1.3* 1.4*  PROT 6.8 6.1*  ALBUMIN 3.6 3.5   No results for input(s): "LIPASE", "AMYLASE" in the last 168 hours. No results for input(s): "AMMONIA" in the last 168 hours. CBC: Recent Labs  Lab 08/10/23 1218 08/10/23 2034 08/11/23 0230  WBC 8.4  --  5.2  HGB 13.4 12.0 11.8*  HCT 40.5 37.2 36.3  MCV 92.3  --  92.6  PLT 324  --  295   Cardiac Enzymes: No results for input(s): "CKTOTAL", "CKMB", "CKMBINDEX", "TROPONINI" in the last 168 hours. BNP: Invalid input(s): "POCBNP" CBG: No results for input(s): "GLUCAP" in the last 168 hours. D-Dimer No results for input(s): "DDIMER" in the last 72 hours. Hgb A1c No results for input(s): "HGBA1C" in the last 72 hours. Lipid Profile No results for input(s): "CHOL", "HDL", "LDLCALC", "TRIG", "CHOLHDL", "LDLDIRECT" in the last 72 hours. Thyroid function studies No results for input(s): "TSH", "T4TOTAL", "T3FREE", "THYROIDAB" in the last 72 hours.  Invalid input(s): "FREET3" Anemia work  up No results for input(s): "VITAMINB12", "FOLATE", "FERRITIN", "TIBC", "IRON", "RETICCTPCT" in the last 72 hours. Urinalysis No results found for: "COLORURINE", "APPEARANCEUR", "LABSPEC", "PHURINE", "GLUCOSEU", "HGBUR", "BILIRUBINUR", "KETONESUR", "PROTEINUR", "UROBILINOGEN", "NITRITE", "LEUKOCYTESUR" Sepsis Labs Recent Labs  Lab 08/10/23 1218 08/11/23 0230  WBC 8.4 5.2   Microbiology No results found for this or any previous visit (from the past 240 hour(s)).   Total time spend on discharging this patient, including the last patient exam, discussing the hospital stay, instructions for ongoing care as it relates to all pertinent caregivers, as well as preparing the medical discharge records, prescriptions, and/or referrals as applicable, is 30 minutes.    Darlin Priestly, MD  [facility] 08/13/2023, 8:29  AM

## 2023-08-13 NOTE — Progress Notes (Signed)
Patient ID: Amanda Dalton, female   DOB: 1931-05-23, 89 y.o.   MRN: [id number]     SURGICAL PROGRESS NOTE   Hospital Day(s): 3.   Interval History: Patient seen and examined, no acute events or new complaints overnight. Patient reports feeling well.  Patient denies any abdominal pain.  Patient denies any nausea or vomiting.  Patient endorses passing gas and having bowel movement.  Patient asking if she will be able to be discharged today.  Vital signs in last 24 hours: [min-max] current  Temp:  [97.9 F (36.6 C)-98.3 F (36.8 C)] 97.9 F (36.6 C) (09/14 0528) Pulse Rate:  [65-88] 65 (09/14 0528) Resp:  [18] 18 (09/14 0528) BP: (142-155)/(68-95) 146/68 (09/14 0528) SpO2:  [94 %-99 %] 97 % (09/14 0528)     Height: 5' (152.4 cm) Weight: 66.4 kg BMI (Calculated): 28.59   Physical Exam:  Constitutional: alert, cooperative and no distress  Respiratory: breathing non-labored at rest  Cardiovascular: regular rate and sinus rhythm  Gastrointestinal: soft, non-tender, and non-distended.  Ventral hernia reducible.  Labs:     Latest Ref Rng & Units 08/11/2023    2:30 AM 08/10/2023    8:34 PM 08/10/2023   12:18 PM  CBC  WBC 4.0 - 10.5 K/uL 5.2   8.4   Hemoglobin 12.0 - 15.0 g/dL 28.4  13.2  44.0   Hematocrit 36.0 - 46.0 % 36.3  37.2  40.5   Platelets 150 - 400 K/uL 295   324       Latest Ref Rng & Units 08/11/2023    2:30 AM 08/10/2023   12:18 PM 01/15/2023    6:45 AM  CMP  Glucose 70 - 99 mg/dL 102  725  366   BUN 8 - 23 mg/dL 13  13  9    Creatinine 0.44 - 1.00 mg/dL 4.40  3.47  4.25   Sodium 135 - 145 mmol/L 137  134  136   Potassium 3.5 - 5.1 mmol/L 3.9  4.0  3.9   Chloride 98 - 111 mmol/L 104  99  105   CO2 22 - 32 mmol/L 24  25  24    Calcium 8.9 - 10.3 mg/dL 8.2  9.2  8.1   Total Protein 6.5 - 8.1 g/dL 6.1  6.8    Total Bilirubin 0.3 - 1.2 mg/dL 1.4  1.3    Alkaline Phos 38 - 126 U/L 34  35    AST 15 - 41 U/L 18  20    ALT 0 - 44 U/L 12  15      Imaging studies: No new  pertinent imaging studies   Assessment/Plan:  89 y.o. female with small bowel obstruction due to ventral hernia, complicated by pertinent comorbidities including A-fib on anticoagulation, COPD.   Patient continue adequate progress.  She resolution of bowel obstruction.  -I advance diet to soft diet.  If patient tolerates soft diet she will be able to be discharged today. -No surgical follow-up needed as patient does not want to consider hernia repair  Gae Gallop, MD

## 2023-08-13 NOTE — TOC Transition Note (Signed)
Transition of Care [facility]) - CM/SW Discharge Note   Patient Details  Name: JAIRE BECHERER MRN: [id number] Date of Birth: May 14, 1931  Transition of Care [facility]) CM/SW Contact:  Liliana Cline, LCSW Phone Number: 08/13/2023, 9:08 AM   Clinical Narrative:    Patient to DC home today. Notified Sue Lush at [facility] who confirms they can follow for PT on site at [facility]. Asked MD for [facility] orders with note about Outpatient PT at [facility] per Sue Lush request.    Final next level of care: Home w Home Health Services Barriers to Discharge: Barriers Resolved   Patient Goals and CMS Choice      Discharge Placement                         Discharge Plan and Services Additional resources added to the After Visit Summary for                            [facility] Arranged: PT [facility] Agency: Other - See comment Date HH Agency Contacted: 08/13/23   Representative spoke with at [facility] Agency: Sue Lush at [facility]  Social Determinants of Health (SDOH) Interventions SDOH Screenings   Food Insecurity: No Food Insecurity (08/11/2023)  Housing: Low Risk  (08/11/2023)  Transportation Needs: No Transportation Needs (08/11/2023)  Utilities: Not At Risk (08/11/2023)  Tobacco Use: Low Risk  (07/09/2023)   Received from [facility]     Readmission Risk Interventions     No data to display

## 2023-08-13 NOTE — Progress Notes (Signed)
Patient wheeled to the medical mall exit by staff to be discharged to the care of his family in stable condition.  Cornell Barman Gilma Bessette

## 2023-08-13 NOTE — Progress Notes (Signed)
IV removed without complications. Telepack returned to station. Discharge instructions reviewed with the patient. Patient being assisted to get dressed by staff.   Amanda Dalton [name]

## 2023-08-17 ENCOUNTER — Telehealth: Payer: Self-pay | Admitting: Cardiovascular Disease

## 2023-08-17 NOTE — Telephone Encounter (Signed)
Pt c/o BP issue: STAT if pt c/o blurred vision, one-sided weakness or slurred speech  1. What are your last 5 BP readings? 165/100 this morning 175/100-last night  2. Are you having any other symptoms (ex. Dizziness, headache, blurred vision, passed out)? Slight headache this morning- legs and ankles were swollen really bad yesterday  3. What is your BP issue? High blood pressure- said she have been in the hospital  for something else and they have changed some of her medicine- she is not sure about the medicine- she wantedto be seen- I made her an appointment for Friday, with Eula Listen- Please call to evaulate

## 2023-08-17 NOTE — Telephone Encounter (Signed)
Pt called c/o elevated BP.  Pt reported last night 9/17, systolic was 175 and this morning 165/100. Pt noted a slight headache but no other symptoms.  Pt stated she was recently seen in the ER for SBO and discharged instructions indicated to stop taking lasix and hydralazine. However, per chart review, ED physician noted pt was not taking lasix and hydralazine prior to admission. Pt reported she previously took lasix as needed and hydralazine if systolic was greater than 160. Pt reported she did not take hydralazine yesterday because she read it can cause a heart attack.  Pt also reported yesterday she experienced to bilateral ankle and leg swelling. She stated she took once dose of PRN spironolactone yesterday. She report leg swelling has improved, but ankles still a little swollen.    Will forward to MD for recommendations Pt has an appointment scheduled for this Friday 08/19/23

## 2023-08-18 NOTE — Progress Notes (Signed)
Cardiology Office Note    Date:  08/19/2023   ID:  Amanda Dalton, DOB Dec 01, 1930, MRN [id number]  PCP:  Jerl Mina, MD  Cardiologist:  Julien Nordmann, MD  Electrophysiologist:  Sherryl Manges, MD   Chief Complaint: Review cardiac medications  History of Present Illness:   Amanda Dalton is a 89 y.o. female with history of permanent A-fib/flutter on apixaban managed by EP, pulmonary hypertension, breast cancer status post right-sided modified radical mastectomy and chemotherapy in 1996, endometrial cancer status post hysterectomy in 2002, aortic atherosclerosis, HTN, HLD, hypothyroidism, asthma, and hiatal/ventral hernia with recurrent SBO who presents for evaluation of elevated blood pressures and review of cardiac medications.  She was diagnosed with atrial flutter in 10/2016 and underwent DCCV after adequate anticoagulation.  Echo in 11/2016 showed an EF of 50 to 55%, no regional wall motion abnormalities, mild focal basal hypertrophy of the septum, grade 2 diastolic dysfunction, mild mitral regurgitation, mildly dilated left atrium, normal RV systolic function and ventricular cavity size, and moderately elevated RVSP estimated at 50 to 55 mmHg.  She was later diagnosed with A-fib in 2019, initially with plans for DCCV, though pharmacologically cardioverted on atenolol and amiodarone prior to procedure.  Zio patch in 06/2018 showed a predominant rhythm of sinus with an average rate of 57 bpm.  There was 27% A-fib burden with an average ventricular rate of 74 bpm with the longest episode lasting 2 days and 8 hours, 8 episodes of SVT lasting up to 6 beats, occasional PACs, and rare atrial couplets, triplets PVCs, and ventricular couplets.  Given issues with bradycardia while in sinus rhythm, she was referred to EP with recommendation to discontinue amiodarone and atenolol with watchful waiting.  Due to recurrent arrhythmia she underwent DCCV in 12/2019 with recurrence of A-fib and follow-up in 01/2020  subsequent spontaneous conversion to sinus rhythm.  She subsequently redeveloped A-fib/flutter and has been rate controlled since 2022.  She was diagnosed with COVID infection in 06/2023.  She was admitted to the hospital from 9/11 through 9/14 with a recurrent SBO with chronic ventral hernia that was conservatively managed.  Hemoccult positive with an admission hemoglobin of 11.8 (consistent with prior readings).  At time of discharge, her furosemide and hydralazine were discontinued (these were as needed prior to admission).  Discharge cardiac/antihypertensive medications included atenolol 12.5 mg daily, apixaban 5 mg twice daily, and spironolactone 25 mg as needed of fluid retention.  She contacted our office on 08/17/2023 with elevated BP readings in the 160s to 170s systolic and lower extremity swelling.  Historically, she was to take furosemide or hydralazine if systolic blood pressure was greater than 160.  She took spironolactone with some improvement in leg swelling.  She requested appointment for today to review medications.  She comes in accompanied by her husband today.  Earlier this past week she reports elevations in BP ranging from the 140s to 170s.  In this setting, she took spironolactone 25 mg tab on 9/17 with improvement in blood pressures to the 140s to 150s.  She has noted some intermittent bilateral ankle edema which did improve with some following spironolactone.  In the setting of the above, she has been snacking on salty crackers recently.  She denies any chest pain, dyspnea, orthopnea, PND, or early satiety.  No falls or symptoms concerning for bleeding.  No dizziness, presyncope, or syncope.  This morning she did take 25 mg atenolol (prescription states 12.5 mg if blood pressure is greater than 130).  At  this time, she feels well.   Labs independently reviewed: 07/2023 -potassium 3.9, BUN 13, serum creatinine 0.71, albumin 3.5, AST/ALT normal, Hgb 11.8, PLT 295 02/2023 -TC 193, TG  86, HDL 76, LDL 100, TSH normal 12/2022 -magnesium 1.9   Past Medical History:  Diagnosis Date   Anxiety    Asthma    Atrial flutter (HCC)    a. Dx 11/17/2016-->CHA2DS2VASc = 3-->Eliquis 5 mg BID.   Breast cancer (HCC)    a. 1996 s/p R modified radical mastectomy-->chemo w/ tamoxifen.   Endometrial cancer (HCC)    a. 08/2001 s/p hysterectomy. No node involvement.   Fracture of right humerus 11/2008   Hypothyroidism    Osteoporosis    Squamous cell carcinoma of skin 01/29/2010   Left superior side. KA, WD SCC not entirely excluded.    Past Surgical History:  Procedure Laterality Date   CARDIOVERSION N/A 04/19/2018   Procedure: CARDIOVERSION;  Surgeon: Antonieta Iba, MD;  Location: [facility] ORS;  Service: Cardiovascular;  Laterality: N/A;   CARDIOVERSION N/A 01/18/2020   Procedure: CARDIOVERSION;  Surgeon: Antonieta Iba, MD;  Location: [facility] ORS;  Service: Cardiovascular;  Laterality: N/A;   Right Mastectomy  1996   VAGINAL HYSTERECTOMY  08/2001    Current Medications: Current Meds  Medication Sig   apixaban (ELIQUIS) 5 MG TABS tablet Take 1 tablet by mouth twice daily   ascorbic acid (VITAMIN C) 500 MG tablet Take 500 mg by mouth daily in the afternoon.   Calcium Carb-Cholecalciferol (CALCIUM-VITAMIN D) 600-400 MG-UNIT TABS Take 1 tablet by mouth daily.   cetirizine (ZYRTEC) 10 MG tablet Take 1 tablet (10 mg total) by mouth daily.   citalopram (CELEXA) 40 MG tablet Take 1 tablet (40 mg total) by mouth daily.   fluticasone-salmeterol (ADVAIR HFA) 45-21 MCG/ACT inhaler Inhale 2 puffs into the lungs at bedtime as needed (asthma symptoms).   furosemide (LASIX) 20 MG tablet Take 1 tablet (20 mg total) by mouth daily as needed (for swelling).   ipratropium (ATROVENT) 0.06 % nasal spray Place 2 sprays into both nostrils 3 (three) times daily as needed for rhinitis.   levothyroxine (SYNTHROID, LEVOTHROID) 75 MCG tablet Take 75 mcg by mouth daily before breakfast.   montelukast  (SINGULAIR) 10 MG tablet Take 1 tablet (10 mg total) by mouth at bedtime.   Multiple Vitamin (MULTIVITAMIN WITH MINERALS) TABS tablet Take 1 tablet by mouth daily.   [DISCONTINUED] atenolol (TENORMIN) 25 MG tablet Take 0.5 tablets (12.5 mg total) by mouth daily. Skip the dose if systolic BP <130 mmHg (Patient taking differently: Take 25 mg by mouth daily. Skip the dose if systolic BP <130 mmHg)   [DISCONTINUED] spironolactone (ALDACTONE) 25 MG tablet Take 25 mg by mouth daily as needed (fluid retention).    Allergies:   Paroxetine hcl   Social History   Socioeconomic History   Marital status: Married    Spouse name: Not on file   Number of children: Not on file   Years of education: Not on file   Highest education level: Not on file  Occupational History   Occupation: Retired  Tobacco Use   Smoking status: Never   Smokeless tobacco: Never  Vaping Use   Vaping status: Never Used  Substance and Sexual Activity   Alcohol use: Yes    Alcohol/week: 1.0 standard drink of alcohol    Types: 1 Glasses of wine per week    Comment: per day   Drug use: No   Sexual activity: Never  Other Topics Concern   Not on file  Social History Narrative   Lives locally with husband - Metallurgist   Social Determinants of Health   Financial Resource Strain: Not on file  Food Insecurity: No Food Insecurity (08/11/2023)   Hunger Vital Sign    Worried About Running Out of Food in the Last Year: Never true    Ran Out of Food in the Last Year: Never true  Transportation Needs: No Transportation Needs (08/11/2023)   PRAPARE - Administrator, Civil Service (Medical): No    Lack of Transportation (Non-Medical): No  Physical Activity: Not on file  Stress: Not on file  Social Connections: Not on file     Family History:  The patient's family history includes Breast cancer in her sister; Hypertension in her mother; Osteoporosis in her sister; Stroke in her father and mother.  ROS:   12-point  review of systems is negative unless otherwise noted in the HPI.   EKGs/Labs/Other Studies Reviewed:    Studies reviewed were summarized above. The additional studies were reviewed today:  Zio patch 06/2018: Normal sinus rhythm max HR of 118 bpm, and avg HR of 57 bpm.   Paroxysmal atrial fibrillation Atrial Fibrillation occurred (27% burden), ranging from 47- 118 bpm (avg of 74 bpm), the longest lasting 2 days 8 hours with an avg rate of 72 bpm.    8 Supraventricular Tachycardia/atrial tachycardia runs occurred, the run with the fastest interval lasting 4 beats with a max rate of 109 bpm, the longest lasting 6 beats with an avg rate of 92 bpm. Isolated SVEs were occasional (3.6%, 40182), SVE Couplets were rare (<1.0%, 233), and SVE Triplets were rare (<1.0%, 8). Isolated VEs were rare (<1.0%), VE Couplets were rare (<1.0%), and no VE Triplets were present. Ventricular Bigeminy and Trigeminy were present. __________  2D echo 12/08/2016: - Left ventricle: The cavity size was normal. There was mild focal    basal hypertrophy of the septum. Systolic function was normal.    The estimated ejection fraction was in the range of 50% to 55%.    Wall motion was normal; there were no regional wall motion    abnormalities. Features are consistent with a pseudonormal left    ventricular filling pattern, with concomitant abnormal relaxation    and increased filling pressure (grade 2 diastolic dysfunction).  - Aortic valve: Mildly thickened leaflets. Transvalvular velocity    was within the normal range. There was no stenosis. There was no    regurgitation.  - Mitral valve: Mildly thickened leaflets . There was mild    regurgitation.  - Left atrium: The atrium was mildly dilated.  - Right ventricle: The cavity size was normal. Systolic function    was normal.  - Tricuspid valve: Structurally normal valve. There was mild    regurgitation.  - Pulmonary arteries: Systolic pressure was moderately  increased,    in the range of 50 mm Hg to 55 mm Hg.  - Inferior vena cava: The vessel was dilated. The respirophasic    diameter changes were blunted (< 50%), consistent with elevated    central venous pressure.    EKG:  EKG is ordered today.  The EKG ordered today demonstrates A-fib with slow ventricular response, 56 bpm, no acute ST-T changes  Recent Labs: 01/15/2023: Magnesium 1.9 08/11/2023: ALT 12; BUN 13; Creatinine, Ser 0.71; Hemoglobin 11.8; Platelets 295; Potassium 3.9; Sodium 137  Recent Lipid Panel No results found for: "CHOL", "TRIG", "HDL", "CHOLHDL", "  VLDL", "LDLCALC", "LDLDIRECT"  PHYSICAL EXAM:    VS:  BP (!) 153/82 (BP Location: Left Arm, Patient Position: Supine)   Pulse (!) 56   Ht 5' (1.524 m)   Wt 142 lb 6.4 oz (64.6 kg)   SpO2 96%   BMI 27.81 kg/m   BMI: Body mass index is 27.81 kg/m.  Physical Exam Vitals reviewed.  Constitutional:      Appearance: She is well-developed.  HENT:     Head: Normocephalic and atraumatic.  Eyes:     General:        Right eye: No discharge.        Left eye: No discharge.  Neck:     Vascular: No JVD.  Cardiovascular:     Rate and Rhythm: Bradycardia present. Rhythm irregularly irregular.     Heart sounds: Normal heart sounds, S1 normal and S2 normal. Heart sounds not distant. No midsystolic click and no opening snap. No murmur heard.    No friction rub.  Pulmonary:     Effort: Pulmonary effort is normal. No respiratory distress.     Breath sounds: Normal breath sounds. No decreased breath sounds, wheezing or rales.  Chest:     Chest wall: No tenderness.  Abdominal:     General: There is no distension.     Palpations: Abdomen is soft.     Tenderness: There is no abdominal tenderness.  Musculoskeletal:     Cervical back: Normal range of motion.     Comments: Mild bilateral ankle nonpitting edema with chronic venous stasis.  Skin:    General: Skin is warm and dry.     Nails: There is no clubbing.  Neurological:      Mental Status: She is alert and oriented to person, place, and time.  Psychiatric:        Speech: Speech normal.        Behavior: Behavior normal.        Thought Content: Thought content normal.        Judgment: Judgment normal.     Wt Readings from Last 3 Encounters:  08/19/23 142 lb 6.4 oz (64.6 kg)  08/10/23 146 lb 6.2 oz (66.4 kg)  06/30/23 140 lb 6.4 oz (63.7 kg)     ASSESSMENT & PLAN:   HTN: Blood pressure has been running on the high side since hospital discharge with most readings in the 140s to 170s systolic.  In this setting she did notice an increase in bilateral ankle swelling.  Recommend she take spironolactone 25 mg daily along with atenolol 12.5 mg daily with hold parameters for systolic blood pressure less than 130 mmHg, and furosemide 20 mg daily as needed (no more than 2 to 3 days/week) for progressive lower extremity swelling.  Blood pressures may be elevated by salt intake.  Recommend sodium restriction.  Check BMP in 1 week.  Pulmonary hypertension: Asymptomatic.  Spironolactone as outlined above.  Obtain echo.  Permanent A-fib/flutter: Asymptomatic with slow ventricular response in the office today in the setting of 25 mg of atenolol earlier this morning.  Recommend she continue atenolol 12.5 mg daily with hold parameters of systolic blood pressure less than 130 mmHg.  CHA2DS2-VASc at least 5 (HTN, age x 2, vascular disease, sex category).  Remains on apixaban 5 mg twice daily and does not meet reduced dosing criteria.  Managed by EP.  Aortic atherosclerosis/HLD: Has historically declined statin.  LDL 100 in 02/2023.    Disposition: F/u with Dr. Mariah Milling or an APP in  2 months, and EP as directed.    Medication Adjustments/Labs and Tests Ordered: Current medicines are reviewed at length with the patient today.  Concerns regarding medicines are outlined above. Medication changes, Labs and Tests ordered today are summarized above and listed in the Patient Instructions  accessible in Encounters.   SignedEula Listen, PA-C 08/19/2023 11:53 AM     [facility] [place] [address] [place], [place] [address] [phone]

## 2023-08-19 ENCOUNTER — Encounter: Payer: Self-pay | Admitting: Physician Assistant

## 2023-08-19 ENCOUNTER — Ambulatory Visit: Payer: Medicare Other | Attending: Physician Assistant | Admitting: Physician Assistant

## 2023-08-19 VITALS — BP 153/82 | HR 56 | Ht 60.0 in | Wt 142.4 lb

## 2023-08-19 DIAGNOSIS — I10 Essential (primary) hypertension: Secondary | ICD-10-CM

## 2023-08-19 DIAGNOSIS — I272 Pulmonary hypertension, unspecified: Secondary | ICD-10-CM

## 2023-08-19 DIAGNOSIS — E782 Mixed hyperlipidemia: Secondary | ICD-10-CM

## 2023-08-19 DIAGNOSIS — I700 Atherosclerosis of aorta: Secondary | ICD-10-CM

## 2023-08-19 DIAGNOSIS — I4821 Permanent atrial fibrillation: Secondary | ICD-10-CM

## 2023-08-19 DIAGNOSIS — Z79899 Other long term (current) drug therapy: Secondary | ICD-10-CM

## 2023-08-19 MED ORDER — ATENOLOL 25 MG PO TABS: 12.5000 mg | ORAL_TABLET | Freq: Every day | ORAL | 3 refills | Status: DC

## 2023-08-19 MED ORDER — SPIRONOLACTONE 25 MG PO TABS: 25.0000 mg | ORAL_TABLET | Freq: Every day | ORAL | 3 refills | Status: DC

## 2023-08-19 MED ORDER — FUROSEMIDE 20 MG PO TABS: 20.0000 mg | ORAL_TABLET | Freq: Every day | ORAL | 3 refills | Status: DC | PRN

## 2023-08-19 NOTE — Patient Instructions (Signed)
Medication Instructions:  Your physician recommends the following medication changes.  START TAKING: Spirolactone 25 mg daily Lasix 20 mg once daily AS NEEDED for swelling   *If you need a refill on your cardiac medications before your next appointment, please call your pharmacy*   Lab Work: Your provider would like for you to return in 1 week to have the following labs drawn: BMT.   Please go to [facility] [address] ([facility] [address], [place] [address] You do not need an appointment.  They are open from 8:00 am-4 pm.  Lunch from 1:00 pm- 2:00 pm You not need to be fasting.   You may also go to any of these [facility] locations:  [employer]  - [address] [employer] [address] Chief Technology Officer)     If you have labs (blood work) drawn today and your tests are completely normal, you will receive your results only by: [employer] (if you have MyChart) OR A paper copy in the mail If you have any lab test that is abnormal or we need to change your treatment, we will call you to review the results.   Testing/Procedures: Your physician has requested that you have an echocardiogram. Echocardiography is a painless test that uses sound waves to create images of your heart. It provides your doctor with information about the size and shape of your heart and how well your heart's chambers and valves are working.   You may receive an ultrasound enhancing agent through an IV if needed to better visualize your heart during the echo. This procedure takes approximately one hour.  There are no restrictions for this procedure.  This will take place at [address] ([facility]) [address], [place] [address]    Follow-Up: At [facility], you and your health needs are our priority.  As part of our continuing mission to provide you with exceptional heart care, we have created designated Provider Care Teams.  These Care Teams include your  primary Cardiologist (physician) and Advanced Practice Providers (APPs -  Physician Assistants and Nurse Practitioners) who all work together to provide you with the care you need, when you need it.  We recommend signing up for the patient portal called "MyChart".  Sign up information is provided on this After Visit Summary.  MyChart is used to connect with patients for Virtual Visits (Telemedicine).  Patients are able to view lab/test results, encounter notes, upcoming appointments, etc.  Non-urgent messages can be sent to your provider as well.   To learn more about what you can do with MyChart, go to [email or web address].    Your next appointment:   2 month(s)  Provider:   You may see Julien Nordmann, MD or one of the following Advanced Practice Providers on your designated Care Team:   Eula Listen, [place]

## 2023-08-19 NOTE — Telephone Encounter (Signed)
Patient seen in clinic today by Eula Listen, PA-C. Questions answered at that time.

## 2023-08-25 ENCOUNTER — Other Ambulatory Visit: Payer: Self-pay

## 2023-08-25 DIAGNOSIS — Z79899 Other long term (current) drug therapy: Secondary | ICD-10-CM

## 2023-08-30 LAB — BASIC METABOLIC PANEL
BUN/Creatinine Ratio: 18 (ref 12–28)
BUN: 15 mg/dL (ref 10–36)
CO2: 22 mmol/L (ref 20–29)
Calcium: 9.4 mg/dL (ref 8.7–10.3)
Chloride: 101 mmol/L (ref 96–106)
Creatinine, Ser: 0.83 mg/dL (ref 0.57–1.00)
Glucose: 112 mg/dL — ABNORMAL HIGH (ref 70–99)
Potassium: 4.2 mmol/L (ref 3.5–5.2)
Sodium: 138 mmol/L (ref 134–144)
eGFR: 67 mL/min/{1.73_m2} (ref >59)

## 2023-09-02 ENCOUNTER — Ambulatory Visit (INDEPENDENT_AMBULATORY_CARE_PROVIDER_SITE_OTHER): Payer: Medicare Other | Admitting: Surgery

## 2023-09-02 ENCOUNTER — Encounter: Payer: Self-pay | Admitting: Surgery

## 2023-09-02 VITALS — BP 153/80 | HR 64 | Temp 98.0°F | Ht 60.0 in | Wt 139.0 lb

## 2023-09-02 DIAGNOSIS — K432 Incisional hernia without obstruction or gangrene: Secondary | ICD-10-CM | POA: Diagnosis not present

## 2023-09-02 DIAGNOSIS — K56609 Unspecified intestinal obstruction, unspecified as to partial versus complete obstruction: Secondary | ICD-10-CM

## 2023-09-02 NOTE — Patient Instructions (Signed)
Follow-up with our office as needed.  Please call and ask to speak with a nurse if you develop questions or concerns.   Bowel Obstruction A bowel obstruction is a blockage in the small or large bowel. The bowel is also called the intestine. It is a long tube that connects the stomach to the anus. When a person eats and drinks, food and fluids go from the mouth to the stomach to the small bowel. This is where most of the nutrients in the food and fluids are absorbed. After the small bowel, material passes through the large bowel for further absorption until any leftover material leaves the body as stool (feces) through the anus during a bowel movement. A bowel obstruction will prevent food and fluids from passing through the bowel as they normally do during digestion. The bowel can become partially or completely blocked. If this condition is not treated, it can be dangerous because the bowel could rupture. What are the causes? Common causes of this condition include: Scar tissue in the body (adhesions) from previous surgery or treatment with high-energy X-rays (radiation). Recent surgery. This may cause the movements of the bowel to slow down and cause food to block the intestine. Inflammatory bowel disease, such as Crohn's disease or diverticulitis. Growths or tumors. A bulging organ or tissue (hernia). Twisting of the bowel (volvulus). A swallowed object (foreign body). Slipping of a part of the bowel into another part (intussusception). What are the signs or symptoms? Symptoms of this condition include: Pain in the abdomen. Depending on the degree of obstruction, pain may be: Mild or severe. Dull cramping or sharp pain. In one area or in the entire abdomen. Nausea and vomiting. Vomit may be greenish or a yellow bile color. Bloating in the abdomen. Constipation. Being unable to pass gas. Frequent belching. Diarrhea. This may occur if the obstruction is partial and runny stool is able to  leak around the obstruction. How is this diagnosed? This condition may be diagnosed based on: A physical exam. Your medical history. Exams to look into the small intestine or the large intestine (endoscopy or colonoscopy). Imaging tests of the abdomen or pelvis, such as X-ray or CT scan. Blood or urine tests. How is this treated? Treatment for this condition depends on the cause and severity of the problem. Treatment may include: Fluids and pain medicines that are given through an IV. Your health care provider may tell you not to eat or drink if you have nausea or vomiting. A clear liquid diet. You may be asked to consume a clear liquid diet for several days. This allows the bowel to rest. Placement of a small tube (nasogastric tube) through the nose, down the throat, and into the stomach. This may relieve pain, discomfort, and nausea by removing blocked air and fluids from the stomach. It can also help the obstruction clear up faster. Surgery. This may be required if other treatments do not work. Surgery may be required for: Bowel obstruction from a hernia. This can be an emergency procedure. Scar tissue that causes frequent or severe obstructions. Follow these instructions at home: Medicines Take over-the-counter and prescription medicines only as told by your health care provider. If you were prescribed an antibiotic medicine, take it as told by your health care provider. Do not stop taking the antibiotic even if you start to feel better. General instructions Follow instructions from your health care provider about eating and drinking restrictions. You may need to avoid solid foods and drink only clear  liquids until your condition improves. Return to your normal activities as told by your health care provider. Ask your health care provider what activities are safe for you. Rest as told by your health care provider. Avoid sitting for a long time without moving. Get up to take short walks  every 1-2 hours. This is important to improve blood flow and breathing. Ask for help if you feel weak or unsteady. Keep all follow-up visits. This is important. How is this prevented? After having a bowel obstruction, you are more likely to have another. You may do the following things to prevent another obstruction: If you have a long-term (chronic) disease, pay attention to your symptoms and contact your health care provider if you have questions or concerns. Avoid becoming constipated. You may need to take these actions to prevent or treat constipation: Drink enough fluid to keep your urine pale yellow. Take over-the-counter or prescription medicines. Eat foods that are high in fiber, such as beans, whole grains, and fresh fruits and vegetables. Limit foods that are high in fat and processed sugars, such as fried or sweet foods. Stay active. Exercise for 30 minutes or more, 5 or more days each week. Ask your health care provider which exercises are safe for you. Avoid stress. Find ways to reduce stress, such as meditation, exercise, or taking time for activities that relax you. Instead of eating three large meals each day, eat three small meals with three small snacks. Work with a Data processing manager to make a healthy meal plan that works for you. Do not use any products that contain nicotine or tobacco. These products include cigarettes, chewing tobacco, and vaping devices, such as e-cigarettes. If you need help quitting, ask your health care provider. Contact a health care provider if: You have a fever. You have chills. Get help right away if: You have increased pain or cramping. You vomit blood. You have uncontrolled vomiting or nausea. You cannot drink fluids because of vomiting or pain. You become confused. You begin feeling very thirsty (dehydrated). You have severe bloating. You feel extremely weak or you faint. Summary A bowel obstruction is a blockage in the small or large bowel. A  bowel obstruction will prevent food and fluids from passing through the bowel as they normally do during digestion. Treatment for this condition depends on the cause and severity of the problem. It may include fluids and pain medicines through an IV, a simple diet, a nasogastric tube, or surgery. Follow instructions from your health care provider about eating restrictions. You may need to avoid solid foods and consume only clear liquids until your condition improves. This information is not intended to replace advice given to you by your health care provider. Make sure you discuss any questions you have with your health care provider. Document Revised: 12/28/2020 Document Reviewed: 12/28/2020 Elsevier Patient Education  2024 [employer] Hernia, Adult     A hernia happens when an organ or tissue inside your body pushes out through a weak spot in the muscles of your belly (abdomen). This makes a bulge. The bulge may be: In a scar from a surgery that was done in your belly (incisional hernia). Near your belly button (umbilical hernia). In your groin (inguinal hernia). Your groin is the area where your leg meets your lower belly. If you are a female, this type could also be in your scrotum. In your upper thigh (femoral hernia). Inside your belly (hiatal hernia). This happens when your stomach slides above the muscle between  your belly and your chest (diaphragm). What are the causes? This condition may be caused by: Lifting heavy things. Coughing over a long period of time. Having trouble pooping (constipation). Trouble pooping can lead to straining. A cut from surgery in your belly. A physical problem that is present at birth. Being very overweight. Smoking. Too much fluid in your belly. A testicle that has not moved down into the scrotum, in males. What are the signs or symptoms? The main symptom is a bulge in the area of the hernia, but a bulge may not always be seen. It may grow bigger or  be easier to see when you cough or strain (such as when lifting something heavy). A hernia that can be pushed back into the belly rarely causes pain. A hernia that cannot be pushed back into the belly may lose its blood supply. This may cause: Pain. Fever. A feeling like you may vomit, and vomiting. Swelling. Trouble pooping. How is this treated? A hernia that is small and painless may not need to be treated. A hernia that is large or painful may be treated with surgery. Surgery to treat a hernia involves pushing the bulge back into place and repairing the weak area of the muscle or belly. Follow these instructions at home: Activity Avoid straining the muscles near your hernia. This can happen when you: Lift something heavy. Poop (have a bowel movement). Do not lift anything that is heavier than 10 lb (4.5 kg), or the limit that you are told. When you lift something heavy, use your leg muscles. Do not use your back muscles to lift. Prevent trouble pooping If told by your doctor, take steps to prevent trouble pooping. You may need to: Drink enough fluid to keep your pee (urine) pale yellow. Take medicines. You will be told what medicines to take. Eat foods that are high in fiber. These include beans, whole grains, and fresh fruits and vegetables. Limit foods that are high in fat and sugar. These include fried or sweet foods. General instructions When you cough, try to cough gently. You may try to push your hernia back in by gently pressing on it when you are lying down. Do not try to force the bulge back in if it will not go in easily. If you are overweight, work with your doctor to lose weight safely. Do not smoke or use any products that contain nicotine or tobacco. If you need help quitting, ask your doctor. If you will be having surgery, watch your hernia for changes in shape, size, or color. Tell your doctor if you see any changes. Take over-the-counter and prescription medicines  only as told by your doctor. Keep all follow-up visits. Contact a doctor if: You get new pain, swelling, or redness near your hernia. You poop fewer times in a week than normal. You have trouble pooping. You have poop that is more dry than normal. You have poop that is harder or larger than normal. Get help right away if: You have a fever or chills. You have belly pain that gets worse. You feel like you may vomit, or you vomit. Your hernia cannot be pushed in by gently pressing on it when you are lying down. Your hernia: Changes in shape or size. Changes color. Feels hard, or it hurts when you touch it. These symptoms may be an emergency. Get help right away. Call your local emergency services (911 in the [place] Do not wait to see if the symptoms will go away.  Do not drive yourself to the hospital. Summary A hernia happens when an organ or tissue inside your body pushes out through a weak spot in the belly muscles. This creates a bulge. If your hernia is small and it does not hurt, you may not need treatment. If your hernia is large or it hurts, you may need surgery. If you will be having surgery, watch your hernia for changes in shape, size, or color. Tell your doctor about any changes. This information is not intended to replace advice given to you by your health care provider. Make sure you discuss any questions you have with your health care provider. Document Revised: 06/23/2020 Document Reviewed: 06/23/2020 Elsevier Patient Education  2024 [employer].

## 2023-09-02 NOTE — Progress Notes (Unsigned)
09/02/2023  History of Present Illness: Amanda Dalton is a 89 y.o. female presenting for follow-up of small bowel obstruction.  Patient was admitted on 08/10/2023 with a small bowel obstruction episode related likely to adhesions as well as a ventral hernia containing small bowel.  The hernia remains reducible and she was able to improve with conservative management.  She had been admitted earlier this year in February for the same.  Dr. Hazle Quant saw her back then I recommended surgical management to prevent further issues.  However the patient declined due to her cardiac issues and asthma.  On her last admission last month, the patient again did not want to pursue any surgical intervention but she also improved with conservative measures alone.  Patient presents today for follow-up.  She reports that since her discharge, she has been doing very well and denies any worsening abdominal pain or distention.  Her hernia remains soft and reducible.  She recently saw her cardiology team due to increased blood pressure and lower extremity swelling.  Her medications were adjusted.  Past Medical History: Past Medical History:  Diagnosis Date   Anxiety    Asthma    Atrial flutter (HCC)    a. Dx 11/17/2016-->[id number] = 3-->Eliquis 5 mg BID.   Breast cancer (HCC)    a. 1996 s/p R modified radical mastectomy-->chemo w/ tamoxifen.   Endometrial cancer (HCC)    a. 08/2001 s/p hysterectomy. No node involvement.   Fracture of right humerus 11/2008   Hypothyroidism    Osteoporosis    Squamous cell carcinoma of skin 01/29/2010   Left superior side. KA, WD SCC not entirely excluded.     Past Surgical History: Past Surgical History:  Procedure Laterality Date   CARDIOVERSION N/A 04/19/2018   Procedure: CARDIOVERSION;  Surgeon: Antonieta Iba, MD;  Location: [facility] ORS;  Service: Cardiovascular;  Laterality: N/A;   CARDIOVERSION N/A 01/18/2020   Procedure: CARDIOVERSION;  Surgeon: Antonieta Iba,  MD;  Location: [facility] ORS;  Service: Cardiovascular;  Laterality: N/A;   Right Mastectomy  1996   VAGINAL HYSTERECTOMY  08/2001    Home Medications: Prior to Admission medications   Medication Sig Start Date End Date Taking? Authorizing Provider  apixaban (ELIQUIS) 5 MG TABS tablet Take 1 tablet by mouth twice daily 07/06/23  Yes Gollan, Tollie Pizza, MD  ascorbic acid (VITAMIN C) 500 MG tablet Take 500 mg by mouth daily in the afternoon.   Yes [provider]  atenolol (TENORMIN) 25 MG tablet Take 0.5 tablets (12.5 mg total) by mouth daily. Skip the dose if systolic BP <130 mmHg 08/19/23  Yes Dunn, Ryan M, PA-C  Calcium Carb-Cholecalciferol (CALCIUM-VITAMIN D) 600-400 MG-UNIT TABS Take 1 tablet by mouth daily. 01/15/23  Yes Gillis Santa, MD  cetirizine (ZYRTEC) 10 MG tablet Take 1 tablet (10 mg total) by mouth daily. 01/15/23  Yes Gillis Santa, MD  citalopram (CELEXA) 40 MG tablet Take 1 tablet (40 mg total) by mouth daily. 01/15/23  Yes Gillis Santa, MD  fluticasone-salmeterol (ADVAIR HFA) [phone] MCG/ACT inhaler Inhale 2 puffs into the lungs at bedtime as needed (asthma symptoms).   Yes [provider]  furosemide (LASIX) 20 MG tablet Take 1 tablet (20 mg total) by mouth daily as needed (for swelling). 08/19/23 11/17/23 Yes Dunn, Raymon Mutton, PA-C  ipratropium (ATROVENT) 0.06 % nasal spray Place 2 sprays into both nostrils 3 (three) times daily as needed for rhinitis.   Yes [provider]  levothyroxine (SYNTHROID, LEVOTHROID) 75 MCG tablet Take  75 mcg by mouth daily before breakfast.   Yes [provider]  montelukast (SINGULAIR) 10 MG tablet Take 1 tablet (10 mg total) by mouth at bedtime. 01/15/23  Yes Gillis Santa, MD  Multiple Vitamin (MULTIVITAMIN WITH MINERALS) TABS tablet Take 1 tablet by mouth daily.   Yes [provider]  spironolactone (ALDACTONE) 25 MG tablet Take 1 tablet (25 mg total) by mouth daily. 08/19/23  Yes Sondra Barges, PA-C     Allergies: Allergies  Allergen Reactions   Paroxetine Hcl Nausea Only and Nausea And Vomiting    Review of Systems: Review of Systems  Constitutional:  Negative for chills and fever.  Respiratory:  Negative for shortness of breath.   Cardiovascular:  Negative for chest pain.  Gastrointestinal:  Negative for abdominal pain, constipation, nausea and vomiting.  Skin:  Negative for rash.    Physical Exam BP (!) 153/80   Pulse 64   Temp 98 F (36.7 C)   Ht 5' (1.524 m)   Wt 139 lb (63 kg)   SpO2 97%   BMI 27.15 kg/m  CONSTITUTIONAL: No acute distress, well nourished. HEENT:  Normocephalic, atraumatic, extraocular motion intact. RESPIRATORY:  Normal respiratory effort without pathologic use of accessory muscles. CARDIOVASCULAR: Irregular rhythm, rate controlled. GI: The abdomen is soft, non-distended, with stable ventral hernia at the top corner of her hysterectomy incision.  Contents are soft and reducible.  NEUROLOGIC:  Motor and sensation is grossly normal.  Cranial nerves are grossly intact. PSYCH:  Alert and oriented to person, place and time. Affect is normal.  Labs/Imaging: CT abdomen/pelvis on 08/18/23: IMPRESSION: Periumbilical ventral hernia is again noted which contains loops of small bowel which results in obstruction of more proximal small bowel loops.   Sigmoid diverticulosis without inflammation.   Large sliding-type hiatal hernia.   Probable focal fatty infiltration in left hepatic lobe.   Aortic Atherosclerosis (ICD10-I70.0).   Assessment and Plan: This is a 89 y.o. female with small bowel obstruction in the setting of incisional ventral hernia.  - Discussed with patient again the findings on her CT scan images showing no ventral hernia at the top corner of her midline incision from her hysterectomy as well as the adhesions leading to the ventral hernia.  I think the combination of these 2 is was contributing to her small bowel obstruction episodes.   Discussed with patient that potentially we could take her to the operating room as an elective case in order to perform lysis of adhesions and repair her ventral hernia to prevent further recurrences.  However the patient currently is not too keen on any surgical interventions.  She again mentions that she thinks her cardiac issues, age, and asthma would be too prohibitive for surgery.  Discussed with patient that there is no pressure from our standpoint and that we can continue to do watchful waiting to make sure her symptoms continue to do well.  She is due to see her pulmonologist and cardiologist next month.  I encouraged her to ask her doctors what their thoughts would be about her undergoing elective surgical repair of her hernia.  That we will be able to give her more information if she has any potential questions about her health. - Discussed with the patient return precautions and if she wants to explore surgical options further.  I spent 30 minutes dedicated to the care of this patient on the date of this encounter to include pre-visit review of records, face-to-face time with the patient discussing diagnosis  and management, and any post-visit coordination of care.   Howie Ill, MD [facility]

## 2023-09-13 ENCOUNTER — Other Ambulatory Visit: Payer: Medicare Other

## 2023-09-15 ENCOUNTER — Ambulatory Visit: Payer: Medicare Other | Attending: Physician Assistant

## 2023-09-15 DIAGNOSIS — I272 Pulmonary hypertension, unspecified: Secondary | ICD-10-CM | POA: Insufficient documentation

## 2023-09-15 LAB — ECHOCARDIOGRAM COMPLETE
Area-P 1/2: 6.96 cm2
MV M vel: 6.3 m/s
MV Peak grad: 158.8 mm[Hg]
Radius: 0.4 cm
S' Lateral: 3.4 cm

## 2023-09-16 ENCOUNTER — Other Ambulatory Visit: Payer: Self-pay

## 2023-09-16 MED ORDER — CARVEDILOL 3.125 MG PO TABS: 3.1250 mg | ORAL_TABLET | Freq: Two times a day (BID) | ORAL | 3 refills | Status: DC

## 2023-09-26 ENCOUNTER — Encounter: Payer: Self-pay | Admitting: Physician Assistant

## 2023-09-26 ENCOUNTER — Ambulatory Visit: Payer: Medicare Other | Attending: Physician Assistant | Admitting: Physician Assistant

## 2023-09-26 VITALS — BP 121/69 | HR 79 | Ht 60.0 in | Wt 137.0 lb

## 2023-09-26 DIAGNOSIS — I4821 Permanent atrial fibrillation: Secondary | ICD-10-CM | POA: Diagnosis present

## 2023-09-26 DIAGNOSIS — I700 Atherosclerosis of aorta: Secondary | ICD-10-CM

## 2023-09-26 DIAGNOSIS — I272 Pulmonary hypertension, unspecified: Secondary | ICD-10-CM | POA: Diagnosis not present

## 2023-09-26 DIAGNOSIS — I10 Essential (primary) hypertension: Secondary | ICD-10-CM

## 2023-09-26 DIAGNOSIS — E782 Mixed hyperlipidemia: Secondary | ICD-10-CM | POA: Diagnosis present

## 2023-09-26 DIAGNOSIS — I5022 Chronic systolic (congestive) heart failure: Secondary | ICD-10-CM

## 2023-09-26 NOTE — Progress Notes (Signed)
Cardiology Office Note    Date:  09/26/2023   ID:  Amanda Dalton, DOB January 24, 1931, MRN [id number]  PCP:  Amanda Mina, MD  Cardiologist:  Amanda Nordmann, MD  Electrophysiologist:  Amanda Manges, MD   Chief Complaint: Follow-up  History of Present Illness:   Amanda Dalton is a 89 y.o. female with history of HFmrEF, permanent A-fib/flutter on apixaban managed by EP, pulmonary hypertension, breast cancer status post right-sided modified radical mastectomy and chemotherapy in 1996, endometrial cancer status post hysterectomy in 2002, aortic atherosclerosis, HTN, HLD, hypothyroidism, asthma, and hiatal/ventral hernia with recurrent SBO who presents for follow-up of echo.   She was diagnosed with atrial flutter in 10/2016 and underwent DCCV after adequate anticoagulation.  Echo in 11/2016 showed an EF of 50 to 55%, no regional wall motion abnormalities, mild focal basal hypertrophy of the septum, grade 2 diastolic dysfunction, mild mitral regurgitation, mildly dilated left atrium, normal RV systolic function and ventricular cavity size, and moderately elevated RVSP estimated at 50 to 55 mmHg.  She was later diagnosed with A-fib in 2019, initially with plans for DCCV, though pharmacologically cardioverted on atenolol and amiodarone prior to procedure.  Zio patch in 06/2018 showed a predominant rhythm of sinus with an average rate of 57 bpm.  There was 27% A-fib burden with an average ventricular rate of 74 bpm with the longest episode lasting 2 days and 8 hours, 8 episodes of SVT lasting up to 6 beats, occasional PACs, and rare atrial couplets, triplets PVCs, and ventricular couplets.  Given issues with bradycardia while in sinus rhythm, she was referred to EP with recommendation to discontinue amiodarone and atenolol with watchful waiting.  Due to recurrent arrhythmia she underwent DCCV in 12/2019 with recurrence of A-fib and follow-up in 01/2020 subsequent spontaneous conversion to sinus rhythm.  She  subsequently redeveloped A-fib/flutter and has been rate controlled since 2022.   She was diagnosed with COVID infection in 06/2023.   She was admitted to the hospital in 07/2023 with a recurrent SBO with chronic ventral hernia that was conservatively managed.  Hemoccult positive with an admission hemoglobin of 11.8 (consistent with prior readings).  At time of discharge, her furosemide and hydralazine were discontinued (these were as needed prior to admission).  Discharge cardiac/antihypertensive medications included atenolol 12.5 mg daily, apixaban 5 mg twice daily, and spironolactone 25 mg as needed of fluid retention.   She contacted our office on 08/17/2023 with elevated BP readings in the 160s to 170s systolic and lower extremity swelling.  Historically, she was to take furosemide or hydralazine if systolic blood pressure was greater than 160.  She took spironolactone with some improvement in leg swelling.  She was seen in follow-up on 08/19/2023 noting some improvement in bilateral ankle edema following spironolactone.  She had been snacking on salty foods.  It was recommended she take spironolactone 25 mg daily along with atenolol 12.5 mg daily as well as as needed furosemide no more than 2 to 3 days/week.  Echo on 09/15/2023 showed an EF of 45 to 50%, global hypokinesis, normal RV systolic function and ventricular cavity size, severely dilated left atrium, moderate mitral regurgitation, mild to moderate tricuspid regurgitation, aortic valve sclerosis without evidence of stenosis, and an estimated right atrial pressure of 3 mmHg.  With mildly reduced EF, it was recommended she stop atenolol and start carvedilol with continuation of spironolactone.  She comes in accompanied by her husband today and is without symptoms of angina or cardiac decompensation.  Has  tolerated the transition from atenolol to carvedilol.  Continues with as needed furosemide, last taking a dose on 10/27 with noted improvement in  bilateral ankle edema.  Has cut out some of the salty snacks.  No dizziness, presyncope, or syncope.  No progressive orthopnea, PND, or early satiety.  Her weight is down 5 pounds today when compared to her visit on 08/19/2023.   Labs independently reviewed: 07/2023 - BUN 15, serum creatinine 0.83, potassium 4.2, albumin 3.5, AST/ALT normal, Hgb 11.8, PLT 295 02/2023 - TC 193, TG 86, HDL 76, LDL 100, TSH normal 12/2022 - magnesium 1.9  Past Medical History:  Diagnosis Date   Anxiety    Asthma    Atrial flutter (HCC)    a. Dx 11/17/2016-->CHA2DS2VASc = 3-->Eliquis 5 mg BID.   Breast cancer (HCC)    a. 1996 s/p R modified radical mastectomy-->chemo w/ tamoxifen.   Endometrial cancer (HCC)    a. 08/2001 s/p hysterectomy. No node involvement.   Fracture of right humerus 11/2008   Hypothyroidism    Osteoporosis    Squamous cell carcinoma of skin 01/29/2010   Left superior side. KA, WD SCC not entirely excluded.    Past Surgical History:  Procedure Laterality Date   CARDIOVERSION N/A 04/19/2018   Procedure: CARDIOVERSION;  Surgeon: Antonieta Iba, MD;  Location: [facility] ORS;  Service: Cardiovascular;  Laterality: N/A;   CARDIOVERSION N/A 01/18/2020   Procedure: CARDIOVERSION;  Surgeon: Antonieta Iba, MD;  Location: [facility] ORS;  Service: Cardiovascular;  Laterality: N/A;   Right Mastectomy  1996   VAGINAL HYSTERECTOMY  08/2001    Current Medications: Current Meds  Medication Sig   apixaban (ELIQUIS) 5 MG TABS tablet Take 1 tablet by mouth twice daily   ascorbic acid (VITAMIN C) 500 MG tablet Take 500 mg by mouth daily in the afternoon.   Calcium Carb-Cholecalciferol (CALCIUM-VITAMIN D) 600-400 MG-UNIT TABS Take 1 tablet by mouth daily.   carvedilol (COREG) 3.125 MG tablet Take 1 tablet (3.125 mg total) by mouth 2 (two) times daily.   cetirizine (ZYRTEC) 10 MG tablet Take 1 tablet (10 mg total) by mouth daily.   citalopram (CELEXA) 40 MG tablet Take 1 tablet (40 mg total) by mouth  daily.   fluticasone-salmeterol (ADVAIR HFA) 45-21 MCG/ACT inhaler Inhale 2 puffs into the lungs at bedtime as needed (asthma symptoms).   furosemide (LASIX) 20 MG tablet Take 1 tablet (20 mg total) by mouth daily as needed (for swelling).   ipratropium (ATROVENT) 0.06 % nasal spray Place 2 sprays into both nostrils 3 (three) times daily as needed for rhinitis.   levothyroxine (SYNTHROID, LEVOTHROID) 75 MCG tablet Take 75 mcg by mouth daily before breakfast.   montelukast (SINGULAIR) 10 MG tablet Take 1 tablet (10 mg total) by mouth at bedtime.   Multiple Vitamin (MULTIVITAMIN WITH MINERALS) TABS tablet Take 1 tablet by mouth daily.   spironolactone (ALDACTONE) 25 MG tablet Take 1 tablet (25 mg total) by mouth daily.    Allergies:   Paroxetine hcl   Social History   Socioeconomic History   Marital status: Married    Spouse name: Not on file   Number of children: Not on file   Years of education: Not on file   Highest education level: Not on file  Occupational History   Occupation: Retired  Tobacco Use   Smoking status: Never    Passive exposure: Never   Smokeless tobacco: Never  Vaping Use   Vaping status: Never Used  Substance and Sexual  Activity   Alcohol use: Yes    Alcohol/week: 1.0 standard drink of alcohol    Types: 1 Glasses of wine per week    Comment: per day   Drug use: No   Sexual activity: Never  Other Topics Concern   Not on file  Social History Narrative   Lives locally with husband - Metallurgist   Social Determinants of Health   Financial Resource Strain: Not on file  Food Insecurity: No Food Insecurity (08/11/2023)   Hunger Vital Sign    Worried About Running Out of Food in the Last Year: Never true    Ran Out of Food in the Last Year: Never true  Transportation Needs: No Transportation Needs (08/11/2023)   PRAPARE - Administrator, Civil Service (Medical): No    Lack of Transportation (Non-Medical): No  Physical Activity: Not on file   Stress: Not on file  Social Connections: Not on file     Family History:  The patient's family history includes Breast cancer in her sister; Hypertension in her mother; Osteoporosis in her sister; Stroke in her father and mother.  ROS:   12-point review of systems is negative unless otherwise noted in the HPI.   EKGs/Labs/Other Studies Reviewed:    Studies reviewed were summarized above. The additional studies were reviewed today:  2D echo 09/15/2023: 1. Left ventricular ejection fraction, by estimation, is 45 to 50%. Left  ventricular ejection fraction by PLAX is 51 %. The left ventricle has  mildly decreased function. The left ventricle demonstrates global  hypokinesis. Left ventricular diastolic  parameters are indeterminate. The average left ventricular global  longitudinal strain is -13.9 %.   2. Right ventricular systolic function is normal. The right ventricular  size is normal.   3. Left atrial size was severely dilated.   4. The mitral valve is normal in structure. Moderate mitral valve  regurgitation. No evidence of mitral stenosis.   5. Tricuspid valve regurgitation is mild to moderate.   6. The aortic valve is normal in structure. Aortic valve regurgitation is  not visualized. Aortic valve sclerosis is present, with no evidence of  aortic valve stenosis.   7. The inferior vena cava is normal in size with greater than 50%  respiratory variability, suggesting right atrial pressure of 3 mmHg.   8. Rhythm concerning for atrial flutter  __________  Zio patch 06/2018: Normal sinus rhythm max HR of 118 bpm, and avg HR of 57 bpm.   Paroxysmal atrial fibrillation Atrial Fibrillation occurred (27% burden), ranging from 47- 118 bpm (avg of 74 bpm), the longest lasting 2 days 8 hours with an avg rate of 72 bpm.    8 Supraventricular Tachycardia/atrial tachycardia runs occurred, the run with the fastest interval lasting 4 beats with a max rate of 109 bpm, the longest  lasting 6 beats with an avg rate of 92 bpm. Isolated SVEs were occasional (3.6%, 40182), SVE Couplets were rare (<1.0%, 233), and SVE Triplets were rare (<1.0%, 8). Isolated VEs were rare (<1.0%), VE Couplets were rare (<1.0%), and no VE Triplets were present. Ventricular Bigeminy and Trigeminy were present. __________   2D echo 12/08/2016: - Left ventricle: The cavity size was normal. There was mild focal    basal hypertrophy of the septum. Systolic function was normal.    The estimated ejection fraction was in the range of 50% to 55%.    Wall motion was normal; there were no regional wall motion    abnormalities. Features  are consistent with a pseudonormal left    ventricular filling pattern, with concomitant abnormal relaxation    and increased filling pressure (grade 2 diastolic dysfunction).  - Aortic valve: Mildly thickened leaflets. Transvalvular velocity    was within the normal range. There was no stenosis. There was no    regurgitation.  - Mitral valve: Mildly thickened leaflets . There was mild    regurgitation.  - Left atrium: The atrium was mildly dilated.  - Right ventricle: The cavity size was normal. Systolic function    was normal.  - Tricuspid valve: Structurally normal valve. There was mild    regurgitation.  - Pulmonary arteries: Systolic pressure was moderately increased,    in the range of 50 mm Hg to 55 mm Hg.  - Inferior vena cava: The vessel was dilated. The respirophasic    diameter changes were blunted (< 50%), consistent with elevated    central venous pressure.    EKG:  EKG is ordered today.  The EKG ordered today demonstrates atrial flutter with variable AV block, 79 bpm, poor R wave progression along the precordial leads, no acute ST-T changes  Recent Labs: 01/15/2023: Magnesium 1.9 08/11/2023: ALT 12; Hemoglobin 11.8; Platelets 295 08/29/2023: BUN 15; Creatinine, Ser 0.83; Potassium 4.2; Sodium 138  Recent Lipid Panel No results found for: "CHOL",  "TRIG", "HDL", "CHOLHDL", "VLDL", "LDLCALC", "LDLDIRECT"  PHYSICAL EXAM:    VS:  BP 121/69 (BP Location: Left Arm, Patient Position: Sitting, Cuff Size: Normal)   Pulse 79   Ht 5' (1.524 m)   Wt 137 lb (62.1 kg)   SpO2 97%   BMI 26.76 kg/m   BMI: Body mass index is 26.76 kg/m.  Physical Exam Vitals reviewed.  Constitutional:      Appearance: She is well-developed.  HENT:     Head: Normocephalic and atraumatic.  Eyes:     General:        Right eye: No discharge.        Left eye: No discharge.  Neck:     Vascular: No JVD.  Cardiovascular:     Rate and Rhythm: Normal rate. Rhythm irregularly irregular.     Pulses:          Posterior tibial pulses are 2+ on the right side and 2+ on the left side.     Heart sounds: Normal heart sounds, S1 normal and S2 normal. Heart sounds not distant. No midsystolic click and no opening snap. No murmur heard.    No friction rub.  Pulmonary:     Effort: Pulmonary effort is normal. No respiratory distress.     Breath sounds: Normal breath sounds. No decreased breath sounds, wheezing, rhonchi or rales.  Chest:     Chest wall: No tenderness.  Abdominal:     General: There is no distension.  Musculoskeletal:     Cervical back: Normal range of motion.     Comments: Trivial bilateral ankle edema with chronic venous stasis hyperpigmentation changes and varicosities.  Skin:    General: Skin is warm and dry.     Nails: There is no clubbing.  Neurological:     Mental Status: She is alert and oriented to person, place, and time.  Psychiatric:        Speech: Speech normal.        Behavior: Behavior normal.        Thought Content: Thought content normal.        Judgment: Judgment normal.     Wt Readings  from Last 3 Encounters:  09/26/23 137 lb (62.1 kg)  09/02/23 139 lb (63 kg)  08/19/23 142 lb 6.4 oz (64.6 kg)     ASSESSMENT & PLAN:   HFmrEF: Euvolemic and well compensated.  Discussed medical management versus noninvasive and invasive  cardiac testing.  She elected to continue low-dose carvedilol, spironolactone, and as needed furosemide with plans to repeat a limited echo in 3 months time with consideration for addition of low-dose ARB/ARNI pending echo findings at that time.  If cardiomyopathy persists, would need to revisit further cardiac testing.  Given her lack of symptoms and age this seems appropriate.  HTN: Blood pressure is well-controlled in the office today.  She remains on carvedilol and spironolactone.  Pulmonary hypertension: Asymptomatic.  Remains on spironolactone and as needed furosemide.  Permanent A-fib/flutter: Ventricular rate well-controlled on low-dose carvedilol.  CHA2DS2-VASc at least 5 (HTN, age x 2, vascular disease, sex category).  Remains on apixaban 5 mg twice daily and does not meet reduced dosing criteria.  No falls or symptoms concerning for bleeding.  Managed by EP.  Aortic atherosclerosis/HLD: LDL 100 in 02/2023.  Has historically declined statin.  This was not discussed in detail today.     Disposition: F/u with Dr. Mariah Milling or an APP in 3 months, and EP as directed.    Medication Adjustments/Labs and Tests Ordered: Current medicines are reviewed at length with the patient today.  Concerns regarding medicines are outlined above. Medication changes, Labs and Tests ordered today are summarized above and listed in the Patient Instructions accessible in Encounters.   Signed, Eula Listen, PA-C 09/26/2023 5:36 PM     [facility] [place] [address] [place], [place] [address] [phone]

## 2023-09-26 NOTE — Patient Instructions (Signed)
Medication Instructions:  Your Physician recommend you continue on your current medication as directed.    *If you need a refill on your cardiac medications before your next appointment, please call your pharmacy*   Lab Work: None ordered If you have labs (blood work) drawn today and your tests are completely normal, you will receive your results only by: MyChart Message (if you have MyChart) OR A paper copy in the mail If you have any lab test that is abnormal or we need to change your treatment, we will call you to review the results.   Testing/Procedures: Your physician has requested that you have an LIMITED echocardiogram. Echocardiography is a painless test that uses sound waves to create images of your heart. It provides your doctor with information about the size and shape of your heart and how well your heart's chambers and valves are working.   You may receive an ultrasound enhancing agent through an IV if needed to better visualize your heart during the echo. This procedure takes approximately one hour.  There are no restrictions for this procedure.  This will take place at [address] [address], [place] [address]   Follow-Up: At [facility], you and your health needs are our priority.  As part of our continuing mission to provide you with exceptional heart care, we have created designated Provider Care Teams.  These Care Teams include your primary Cardiologist (physician) and Advanced Practice Providers (APPs -  Physician Assistants and Nurse Practitioners) who all work together to provide you with the care you need, when you need it.  We recommend signing up for the patient portal called "MyChart".  Sign up information is provided on this After Visit Summary.  MyChart is used to connect with patients for Virtual Visits (Telemedicine).  Patients are able to view lab/test results, encounter notes, upcoming appointments, etc.  Non-urgent messages  can be sent to your provider as well.   To learn more about what you can do with MyChart, go to [email or web address].    Your next appointment:   3 month(s)  Provider:   You may see Julien Nordmann, MD or one of the following Advanced Practice Providers on your designated Care Team:   Eula Listen, [place]

## 2023-10-11 ENCOUNTER — Ambulatory Visit: Payer: Medicare Other | Attending: Physician Assistant

## 2023-10-11 DIAGNOSIS — I5022 Chronic systolic (congestive) heart failure: Secondary | ICD-10-CM | POA: Diagnosis not present

## 2023-10-11 LAB — ECHOCARDIOGRAM LIMITED
Calc EF: 51.1 %
S' Lateral: 3.4 cm
Single Plane A2C EF: 52.6 %
Single Plane A4C EF: 49.6 %

## 2023-10-13 ENCOUNTER — Telehealth: Payer: Self-pay | Admitting: Cardiovascular Disease

## 2023-10-13 ENCOUNTER — Other Ambulatory Visit: Payer: Self-pay

## 2023-10-13 DIAGNOSIS — I5022 Chronic systolic (congestive) heart failure: Secondary | ICD-10-CM

## 2023-10-13 MED ORDER — FUROSEMIDE 20 MG PO TABS: 20.0000 mg | ORAL_TABLET | Freq: Every day | ORAL | Status: DC

## 2023-10-13 NOTE — Telephone Encounter (Signed)
Patient returned RN's call. 

## 2023-10-13 NOTE — Telephone Encounter (Signed)
Called patient back, advised of lab results:   Sondra Barges, PA-C 10/12/2023  7:22 AM EST     Echo showed normal pump function, normal wall motion, mild thickening of the heart, normal right-sided pump function, elevated pressure on the right side of the heart, moderately to severely leaky mitral and tricuspid valves.     Recommendations: -Please move up patient's appointment to be seen by APP or primary cardiologist to discuss possible further evaluation of leaky heart valves and elevated right-sided pressures (pulmonary hypertension) -I would like for her to start taking furosemide to 20 mg daily with a follow-up BMP 1 week thereafter     Patient scheduled for follow up sooner- December 3rd with Eula Listen, PA-C.  Patient aware to start her Lasix 20 mg daily, repeat BMET ordered for 1 week. Patient aware to have blood work completed.   Thanks!

## 2023-10-19 ENCOUNTER — Ambulatory Visit: Payer: Medicare Other | Admitting: Physician Assistant

## 2023-10-20 ENCOUNTER — Other Ambulatory Visit: Payer: Self-pay

## 2023-10-20 DIAGNOSIS — I5022 Chronic systolic (congestive) heart failure: Secondary | ICD-10-CM

## 2023-10-21 LAB — BASIC METABOLIC PANEL
BUN/Creatinine Ratio: 22 (ref 12–28)
BUN: 19 mg/dL (ref 10–36)
CO2: 24 mmol/L (ref 20–29)
Calcium: 9.3 mg/dL (ref 8.7–10.3)
Chloride: 104 mmol/L (ref 96–106)
Creatinine, Ser: 0.87 mg/dL (ref 0.57–1.00)
Glucose: 92 mg/dL (ref 70–99)
Potassium: 4.6 mmol/L (ref 3.5–5.2)
Sodium: 143 mmol/L (ref 134–144)
eGFR: 63 mL/min/{1.73_m2} (ref >59)

## 2023-10-30 NOTE — Progress Notes (Unsigned)
Cardiology Office Note    Date:  11/01/2023   ID:  Amanda Dalton, DOB Jul 22, 1931, MRN [id number]  PCP:  Jerl Mina, MD  Cardiologist:  Julien Nordmann, MD  Electrophysiologist:  Sherryl Manges, MD   Chief Complaint: Follow-up  History of Present Illness:   Amanda Dalton is a 89 y.o. female with history of HFmrEF, permanent A-fib/flutter on apixaban managed by EP, pulmonary hypertension, breast cancer status post right-sided modified radical mastectomy and chemotherapy in 1996, endometrial cancer status post hysterectomy in 2002, aortic atherosclerosis, HTN, HLD, hypothyroidism, asthma, and hiatal/ventral hernia with recurrent SBO who presents for follow-up of cardiomyopathy and blood pressure.   She was diagnosed with atrial flutter in 10/2016 and underwent DCCV after adequate anticoagulation.  Echo in 11/2016 showed an EF of 50 to 55%, no regional wall motion abnormalities, mild focal basal hypertrophy of the septum, grade 2 diastolic dysfunction, mild mitral regurgitation, mildly dilated left atrium, normal RV systolic function and ventricular cavity size, and moderately elevated RVSP estimated at 50 to 55 mmHg.  She was later diagnosed with A-fib in 2019, initially with plans for DCCV, though pharmacologically cardioverted on atenolol and amiodarone prior to procedure.  Zio patch in 06/2018 showed a predominant rhythm of sinus with an average rate of 57 bpm.  There was 27% A-fib burden with an average ventricular rate of 74 bpm with the longest episode lasting 2 days and 8 hours, 8 episodes of SVT lasting up to 6 beats, occasional PACs, and rare atrial couplets, triplets PVCs, and ventricular couplets.  Given issues with bradycardia while in sinus rhythm, she was referred to EP with recommendation to discontinue amiodarone and atenolol with watchful waiting.  Due to recurrent arrhythmia she underwent DCCV in 12/2019 with recurrence of A-fib, and in follow-up in 01/2020 there was subsequent spontaneous  conversion to sinus rhythm.  She subsequently redeveloped A-fib/flutter and has been rate controlled since 2022.   She was diagnosed with COVID infection in 06/2023.   She was admitted to the hospital in 07/2023 with a recurrent SBO with chronic ventral hernia that was conservatively managed.  Hemoccult positive with an admission hemoglobin of 11.8 (consistent with prior readings).  At time of discharge, her furosemide and hydralazine were discontinued (these were as needed prior to admission).  Discharge cardiac/antihypertensive medications included atenolol 12.5 mg daily, apixaban 5 mg twice daily, and spironolactone 25 mg as needed of fluid retention.   She contacted our office on 08/17/2023 with elevated BP readings in the 160s to 170s systolic and lower extremity swelling.  Historically, she was to take furosemide or hydralazine if systolic blood pressure was greater than 160.  She took spironolactone with some improvement in leg swelling.  She was seen in follow-up on 08/19/2023 noting some improvement in bilateral ankle edema following spironolactone.  She had been snacking on salty foods.  It was recommended she take spironolactone 25 mg daily along with atenolol 12.5 mg daily as well as as needed furosemide no more than 2 to 3 days/week.  Echo on 09/15/2023 showed an EF of 45 to 50%, global hypokinesis, normal RV systolic function and ventricular cavity size, severely dilated left atrium, moderate mitral regurgitation, mild to moderate tricuspid regurgitation, aortic valve sclerosis without evidence of stenosis, and an estimated right atrial pressure of 3 mmHg.  With mildly reduced EF, it was recommended she stop atenolol and start carvedilol with continuation of spironolactone.  She was last seen in the office on 09/26/2023 and was without symptoms  of angina or cardiac decompensation.  She had tolerated the transition from atenolol to carvedilol.  Her weight was down 5 pounds when compared to her visit  in 07/2023.  She preferred to continue current medical therapy and pursue a limited echo in several months to reevaluate LV systolic function.  She comes in accompanied by her husband today and is doing very well from a cardiac perspective, without symptoms of angina or cardiac decompensation.  No dyspnea, palpitations, dizziness, presyncope, or syncope.  No falls, hematochezia, or melena.  Weight stable.  Blood pressures have been well-controlled at home carvedilol 3.125 mg twice daily, spironolactone 25 mg daily, and furosemide 20 mg daily.  Labs stable last month.  Overall, she is pleased with her improvement.   Labs independently reviewed: 09/2023 - BUN 19, serum creatinine 0.87, potassium 4.6 07/2023 - albumin 3.5, AST/ALT normal, Hgb 11.8, PLT 295 02/2023 - TC 193, TG 86, HDL 76, LDL 100, TSH normal 12/2022 - magnesium 1.9  Past Medical History:  Diagnosis Date   Anxiety    Asthma    Atrial flutter (HCC)    a. Dx 11/17/2016-->CHA2DS2VASc = 3-->Eliquis 5 mg BID.   Breast cancer (HCC)    a. 1996 s/p R modified radical mastectomy-->chemo w/ tamoxifen.   Endometrial cancer (HCC)    a. 08/2001 s/p hysterectomy. No node involvement.   Fracture of right humerus 11/2008   Hypothyroidism    Osteoporosis    Squamous cell carcinoma of skin 01/29/2010   Left superior side. KA, WD SCC not entirely excluded.    Past Surgical History:  Procedure Laterality Date   CARDIOVERSION N/A 04/19/2018   Procedure: CARDIOVERSION;  Surgeon: Antonieta Iba, MD;  Location: [facility] ORS;  Service: Cardiovascular;  Laterality: N/A;   CARDIOVERSION N/A 01/18/2020   Procedure: CARDIOVERSION;  Surgeon: Antonieta Iba, MD;  Location: [facility] ORS;  Service: Cardiovascular;  Laterality: N/A;   Right Mastectomy  1996   VAGINAL HYSTERECTOMY  08/2001    Current Medications: Current Meds  Medication Sig   apixaban (ELIQUIS) 5 MG TABS tablet Take 1 tablet by mouth twice daily   ascorbic acid (VITAMIN C) 500 MG tablet  Take 500 mg by mouth daily in the afternoon.   Calcium Carb-Cholecalciferol (CALCIUM-VITAMIN D) 600-400 MG-UNIT TABS Take 1 tablet by mouth daily.   carvedilol (COREG) 3.125 MG tablet Take 1 tablet (3.125 mg total) by mouth 2 (two) times daily.   cetirizine (ZYRTEC) 10 MG tablet Take 1 tablet (10 mg total) by mouth daily.   citalopram (CELEXA) 40 MG tablet Take 1 tablet (40 mg total) by mouth daily.   fluticasone-salmeterol (ADVAIR HFA) 45-21 MCG/ACT inhaler Inhale 2 puffs into the lungs at bedtime as needed (asthma symptoms).   furosemide (LASIX) 20 MG tablet Take 1 tablet (20 mg total) by mouth daily.   ipratropium (ATROVENT) 0.06 % nasal spray Place 2 sprays into both nostrils 3 (three) times daily as needed for rhinitis.   levothyroxine (SYNTHROID, LEVOTHROID) 75 MCG tablet Take 75 mcg by mouth daily before breakfast.   montelukast (SINGULAIR) 10 MG tablet Take 1 tablet (10 mg total) by mouth at bedtime.   Multiple Vitamin (MULTIVITAMIN WITH MINERALS) TABS tablet Take 1 tablet by mouth daily.   spironolactone (ALDACTONE) 25 MG tablet Take 1 tablet (25 mg total) by mouth daily.    Allergies:   Paroxetine hcl   Social History   Socioeconomic History   Marital status: Married    Spouse name: Not on file  Number of children: Not on file   Years of education: Not on file   Highest education level: Not on file  Occupational History   Occupation: Retired  Tobacco Use   Smoking status: Never    Passive exposure: Never   Smokeless tobacco: Never  Vaping Use   Vaping status: Never Used  Substance and Sexual Activity   Alcohol use: Yes    Alcohol/week: 1.0 standard drink of alcohol    Types: 1 Glasses of wine per week    Comment: per day   Drug use: No   Sexual activity: Never  Other Topics Concern   Not on file  Social History Narrative   Lives locally with husband - Metallurgist   Social Determinants of Health   Financial Resource Strain: Not on file  Food Insecurity: No Food  Insecurity (08/11/2023)   Hunger Vital Sign    Worried About Running Out of Food in the Last Year: Never true    Ran Out of Food in the Last Year: Never true  Transportation Needs: No Transportation Needs (08/11/2023)   PRAPARE - Administrator, Civil Service (Medical): No    Lack of Transportation (Non-Medical): No  Physical Activity: Not on file  Stress: Not on file  Social Connections: Not on file     Family History:  The patient's family history includes Breast cancer in her sister; Hypertension in her mother; Osteoporosis in her sister; Stroke in her father and mother.  ROS:   12-point review of systems is negative unless otherwise noted in the HPI.   EKGs/Labs/Other Studies Reviewed:    Studies reviewed were summarized above. The additional studies were reviewed today:  Limited echo 10/11/2023: 1. Left ventricular ejection fraction, by estimation, is 55 to 60%. The  left ventricle has normal function. The left ventricle has no regional  wall motion abnormalities. There is mild left ventricular hypertrophy.   2. Right ventricular systolic function is normal. The right ventricular  size is normal. There is severely elevated pulmonary artery systolic  pressure. The estimated right ventricular systolic pressure is 77.9 mmHg.   3. The mitral valve is normal in structure. Moderate to severe mitral  valve regurgitation. No evidence of mitral stenosis.   4. Tricuspid valve regurgitation is moderate to severe.   5. The aortic valve has an indeterminant number of cusps. Aortic valve  regurgitation is not visualized. No aortic stenosis is present.   6. The inferior vena cava is dilated in size with >50% respiratory  variability, suggesting right atrial pressure of 8 mmHg.  __________  2D echo 09/15/2023: 1. Left ventricular ejection fraction, by estimation, is 45 to 50%. Left  ventricular ejection fraction by PLAX is 51 %. The left ventricle has  mildly decreased  function. The left ventricle demonstrates global  hypokinesis. Left ventricular diastolic  parameters are indeterminate. The average left ventricular global  longitudinal strain is -13.9 %.   2. Right ventricular systolic function is normal. The right ventricular  size is normal.   3. Left atrial size was severely dilated.   4. The mitral valve is normal in structure. Moderate mitral valve  regurgitation. No evidence of mitral stenosis.   5. Tricuspid valve regurgitation is mild to moderate.   6. The aortic valve is normal in structure. Aortic valve regurgitation is  not visualized. Aortic valve sclerosis is present, with no evidence of  aortic valve stenosis.   7. The inferior vena cava is normal in size with greater  than 50%  respiratory variability, suggesting right atrial pressure of 3 mmHg.   8. Rhythm concerning for atrial flutter  __________   Zio patch 06/2018: Normal sinus rhythm max HR of 118 bpm, and avg HR of 57 bpm.   Paroxysmal atrial fibrillation Atrial Fibrillation occurred (27% burden), ranging from 47- 118 bpm (avg of 74 bpm), the longest lasting 2 days 8 hours with an avg rate of 72 bpm.    8 Supraventricular Tachycardia/atrial tachycardia runs occurred, the run with the fastest interval lasting 4 beats with a max rate of 109 bpm, the longest lasting 6 beats with an avg rate of 92 bpm. Isolated SVEs were occasional (3.6%, 40182), SVE Couplets were rare (<1.0%, 233), and SVE Triplets were rare (<1.0%, 8). Isolated VEs were rare (<1.0%), VE Couplets were rare (<1.0%), and no VE Triplets were present. Ventricular Bigeminy and Trigeminy were present. __________   2D echo 12/08/2016: - Left ventricle: The cavity size was normal. There was mild focal    basal hypertrophy of the septum. Systolic function was normal.    The estimated ejection fraction was in the range of 50% to 55%.    Wall motion was normal; there were no regional wall motion    abnormalities. Features are  consistent with a pseudonormal left    ventricular filling pattern, with concomitant abnormal relaxation    and increased filling pressure (grade 2 diastolic dysfunction).  - Aortic valve: Mildly thickened leaflets. Transvalvular velocity    was within the normal range. There was no stenosis. There was no    regurgitation.  - Mitral valve: Mildly thickened leaflets . There was mild    regurgitation.  - Left atrium: The atrium was mildly dilated.  - Right ventricle: The cavity size was normal. Systolic function    was normal.  - Tricuspid valve: Structurally normal valve. There was mild    regurgitation.  - Pulmonary arteries: Systolic pressure was moderately increased,    in the range of 50 mm Hg to 55 mm Hg.  - Inferior vena cava: The vessel was dilated. The respirophasic    diameter changes were blunted (< 50%), consistent with elevated    central venous pressure.    EKG:  EKG is not ordered today.    Recent Labs: 01/15/2023: Magnesium 1.9 08/11/2023: ALT 12; Hemoglobin 11.8; Platelets 295 10/20/2023: BUN 19; Creatinine, Ser 0.87; Potassium 4.6; Sodium 143  Recent Lipid Panel No results found for: "CHOL", "TRIG", "HDL", "CHOLHDL", "VLDL", "LDLCALC", "LDLDIRECT"  PHYSICAL EXAM:    VS:  BP 118/75 (BP Location: Left Arm, Patient Position: Sitting, Cuff Size: Normal)   Pulse 61   Ht 5' (1.524 m)   Wt 136 lb 9.6 oz (62 kg)   SpO2 98%   BMI 26.68 kg/m   BMI: Body mass index is 26.68 kg/m.  Physical Exam Vitals reviewed.  Constitutional:      Appearance: She is well-developed.  HENT:     Head: Normocephalic and atraumatic.  Eyes:     General:        Right eye: No discharge.        Left eye: No discharge.  Neck:     Vascular: No JVD.  Cardiovascular:     Rate and Rhythm: Normal rate. Rhythm irregularly irregular.     Heart sounds: Normal heart sounds, S1 normal and S2 normal. Heart sounds not distant. No midsystolic click and no opening snap. No murmur heard.    No  friction rub.  Pulmonary:  Effort: Pulmonary effort is normal. No respiratory distress.     Breath sounds: Normal breath sounds. No decreased breath sounds, wheezing, rhonchi or rales.  Chest:     Chest wall: No tenderness.  Abdominal:     General: There is no distension.  Musculoskeletal:     Cervical back: Normal range of motion.  Skin:    General: Skin is warm and dry.     Nails: There is no clubbing.  Neurological:     Mental Status: She is alert and oriented to person, place, and time.  Psychiatric:        Speech: Speech normal.        Behavior: Behavior normal.        Thought Content: Thought content normal.        Judgment: Judgment normal.     Wt Readings from Last 3 Encounters:  11/01/23 136 lb 9.6 oz (62 kg)  09/26/23 137 lb (62.1 kg)  09/02/23 139 lb (63 kg)     ASSESSMENT & PLAN:   HFmrEF: She is doing well and without symptoms of angina or cardiac decompensation.  She is euvolemic.  Prefers medical therapy at this time with plans for repeat limited echo in 6 weeks with consideration for addition of further GDMT at that time pending echo findings.  For now, continue current dose of carvedilol, spironolactone, and furosemide.  HTN: Blood pressure is well-controlled in the office today.  She remains on carvedilol and spironolactone as outlined above.  Recent labs stable.  Pulmonary hypertension: Asymptomatic.  Remains on spironolactone and furosemide.  Permanent A-fib/flutter: Ventricular rates well-controlled on carvedilol 3.125 mg twice daily.  CHA2DS2-VASc at least 6 (CHF, HTN, age x 2, vascular disease, sex category).  She remains on apixaban 5 mg twice daily and does not meet reduced dosing criteria.  No falls or symptoms concerning for bleeding.  Recent labs stable.  Managed by EP.  Aortic atherosclerosis/HLD: LDL 100 and 02/2023.  Historically, has declined statin.     Disposition: F/u with Dr. Mariah Milling or an APP in 2 months, and EP as directed.     Medication Adjustments/Labs and Tests Ordered: Current medicines are reviewed at length with the patient today.  Concerns regarding medicines are outlined above. Medication changes, Labs and Tests ordered today are summarized above and listed in the Patient Instructions accessible in Encounters.   Signed, Eula Listen, PA-C 11/01/2023 4:01 PM      [facility] [address] [place], [place] [address] [phone]

## 2023-11-01 ENCOUNTER — Encounter: Payer: Self-pay | Admitting: Physician Assistant

## 2023-11-01 ENCOUNTER — Ambulatory Visit: Payer: Medicare Other | Attending: Physician Assistant | Admitting: Physician Assistant

## 2023-11-01 VITALS — BP 118/75 | HR 61 | Ht 60.0 in | Wt 136.6 lb

## 2023-11-01 DIAGNOSIS — I5022 Chronic systolic (congestive) heart failure: Secondary | ICD-10-CM | POA: Insufficient documentation

## 2023-11-01 DIAGNOSIS — I700 Atherosclerosis of aorta: Secondary | ICD-10-CM | POA: Insufficient documentation

## 2023-11-01 DIAGNOSIS — I428 Other cardiomyopathies: Secondary | ICD-10-CM | POA: Diagnosis present

## 2023-11-01 DIAGNOSIS — E782 Mixed hyperlipidemia: Secondary | ICD-10-CM | POA: Insufficient documentation

## 2023-11-01 DIAGNOSIS — I272 Pulmonary hypertension, unspecified: Secondary | ICD-10-CM | POA: Diagnosis present

## 2023-11-01 DIAGNOSIS — I4821 Permanent atrial fibrillation: Secondary | ICD-10-CM | POA: Insufficient documentation

## 2023-11-01 DIAGNOSIS — I10 Essential (primary) hypertension: Secondary | ICD-10-CM | POA: Insufficient documentation

## 2023-11-01 NOTE — Patient Instructions (Signed)
Medication Instructions:  Your Physician recommend you continue on your current medication as directed.    *If you need a refill on your cardiac medications before your next appointment, please call your pharmacy*   Lab Work: None ordered at this time  If you have labs (blood work) drawn today and your tests are completely normal, you will receive your results only by: MyChart Message (if you have MyChart) OR A paper copy in the mail If you have any lab test that is abnormal or we need to change your treatment, we will call you to review the results.   Testing/Procedures: Your physician has requested that you have an LIMITED echocardiogram. Echocardiography is a painless test that uses sound waves to create images of your heart. It provides your doctor with information about the size and shape of your heart and how well your heart's chambers and valves are working.   You may receive an ultrasound enhancing agent through an IV if needed to better visualize your heart during the echo. This procedure takes approximately one hour.  There are no restrictions for this procedure.  This will take place at [address] [facility]) [address], [place] [address]  Please note: We ask at that you not bring children with you during ultrasound (echo/ vascular) testing. Due to room size and safety concerns, children are not allowed in the ultrasound rooms during exams. Our front office staff cannot provide observation of children in our lobby area while testing is being conducted. An adult accompanying a patient to their appointment will only be allowed in the ultrasound room at the discretion of the ultrasound technician under special circumstances. We apologize for any inconvenience.    Follow-Up: At [facility], you and your health needs are our priority.  As part of our continuing mission to provide you with exceptional heart care, we have created designated Provider Care Teams.   These Care Teams include your primary Cardiologist (physician) and Advanced Practice Providers (APPs -  Physician Assistants and Nurse Practitioners) who all work together to provide you with the care you need, when you need it.  We recommend signing up for the patient portal called "MyChart".  Sign up information is provided on this After Visit Summary.  MyChart is used to connect with patients for Virtual Visits (Telemedicine).  Patients are able to view lab/test results, encounter notes, upcoming appointments, etc.  Non-urgent messages can be sent to your provider as well.   To learn more about what you can do with MyChart, go to [email or web address].    Your next appointment:   2 month(s)  Provider:   You may see Julien Nordmann, MD or one of the following Advanced Practice Providers on your designated Care Team:   Nicolasa Ducking, NP Eula Listen, PA-C Cadence Fransico Michael, PA-C Charlsie Quest, NP Carlos Levering, NP

## 2023-11-16 ENCOUNTER — Ambulatory Visit: Payer: Medicare Other | Admitting: Dermatology

## 2023-11-16 DIAGNOSIS — D1801 Hemangioma of skin and subcutaneous tissue: Secondary | ICD-10-CM

## 2023-11-16 DIAGNOSIS — Z1283 Encounter for screening for malignant neoplasm of skin: Secondary | ICD-10-CM | POA: Diagnosis not present

## 2023-11-16 DIAGNOSIS — L814 Other melanin hyperpigmentation: Secondary | ICD-10-CM

## 2023-11-16 DIAGNOSIS — Z85828 Personal history of other malignant neoplasm of skin: Secondary | ICD-10-CM

## 2023-11-16 DIAGNOSIS — W908XXA Exposure to other nonionizing radiation, initial encounter: Secondary | ICD-10-CM | POA: Diagnosis not present

## 2023-11-16 DIAGNOSIS — L578 Other skin changes due to chronic exposure to nonionizing radiation: Secondary | ICD-10-CM

## 2023-11-16 DIAGNOSIS — I872 Venous insufficiency (chronic) (peripheral): Secondary | ICD-10-CM

## 2023-11-16 DIAGNOSIS — Z7189 Other specified counseling: Secondary | ICD-10-CM

## 2023-11-16 DIAGNOSIS — L821 Other seborrheic keratosis: Secondary | ICD-10-CM

## 2023-11-16 DIAGNOSIS — D229 Melanocytic nevi, unspecified: Secondary | ICD-10-CM

## 2023-11-16 DIAGNOSIS — Z8589 Personal history of malignant neoplasm of other organs and systems: Secondary | ICD-10-CM

## 2023-11-16 NOTE — Patient Instructions (Signed)

## 2023-11-16 NOTE — Progress Notes (Signed)
   Follow-Up Visit   Subjective  Amanda Dalton is a 89 y.o. female who presents for the following: Skin Cancer Screening and Full Body Skin Exam, hx of SCC  The patient presents for Total-Body Skin Exam (TBSE) for skin cancer screening and mole check. The patient has spots, moles and lesions to be evaluated, some may be new or changing and the patient may have concern these could be cancer.    The following portions of the chart were reviewed this encounter and updated as appropriate: medications, allergies, medical history  Review of Systems:  No other skin or systemic complaints except as noted in HPI or Assessment and Plan.  Objective  Well appearing patient in no apparent distress; mood and affect are within normal limits.  A full examination was performed including scalp, head, eyes, ears, nose, lips, neck, chest, axillae, abdomen, back, buttocks, bilateral upper extremities, bilateral lower extremities, hands, feet, fingers, toes, fingernails, and toenails. All findings within normal limits unless otherwise noted below.   Relevant physical exam findings are noted in the Assessment and Plan.    Assessment & Plan   SKIN CANCER SCREENING PERFORMED TODAY.  ACTINIC DAMAGE - Chronic condition, secondary to cumulative UV/sun exposure - diffuse scaly erythematous macules with underlying dyspigmentation - Recommend daily broad spectrum sunscreen SPF 30+ to sun-exposed areas, reapply every 2 hours as needed.  - Staying in the shade or wearing long sleeves, sun glasses (UVA+UVB protection) and wide brim hats (4-inch brim around the entire circumference of the hat) are also recommended for sun protection.  - Call for new or changing lesions.  LENTIGINES, SEBORRHEIC KERATOSES, HEMANGIOMAS - Benign normal skin lesions - Benign-appearing - Call for any changes  MELANOCYTIC NEVI - Tan-brown and/or pink-flesh-colored symmetric macules and papules - Benign appearing on exam today -  Observation - Call clinic for new or changing moles - Recommend daily use of broad spectrum spf 30+ sunscreen to sun-exposed areas.   HISTORY OF SQUAMOUS CELL CARCINOMA OF THE SKIN - No evidence of recurrence today - No lymphadenopathy - Recommend regular full body skin exams - Recommend daily broad spectrum sunscreen SPF 30+ to sun-exposed areas, reapply every 2 hours as needed.  - Call if any new or changing lesions are noted between office visits - L sup side    STASIS DERMATITIS legs Exam: Erythematous, scaly patches involving the ankles and distal lower legs with associated lower leg edema.  Chronic and persistent condition with duration or expected duration over one year. Condition is symptomatic / bothersome to patient. Not to goal.   Stasis in the legs causes chronic leg swelling, which may result in itchy or painful rashes, skin discoloration, skin texture changes, and sometimes ulceration.  Recommend daily graduated compression hose/stockings- easiest to put on first thing in morning, remove at bedtime.  Elevate legs as much as possible. Avoid salt/sodium rich foods.  Treatment Plan: Recommend Compression socs    Return in about 1 year (around 11/15/2024) for TBSE.  I, Ardis Rowan, RMA, am acting as scribe for Armida Sans, MD .   Documentation: I have reviewed the above documentation for accuracy and completeness, and I agree with the above.  Armida Sans, MD

## 2023-11-24 ENCOUNTER — Ambulatory Visit: Payer: Medicare Other | Attending: Physician Assistant

## 2023-11-24 DIAGNOSIS — I428 Other cardiomyopathies: Secondary | ICD-10-CM | POA: Diagnosis present

## 2023-11-25 LAB — ECHOCARDIOGRAM LIMITED
AV Mean grad: 7 mm[Hg]
AV Peak grad: 13.1 mm[Hg]
Ao pk vel: 1.81 m/s
MV M vel: 5.6 m/s
MV Peak grad: 125.4 mm[Hg]
Radius: 0.4 cm
S' Lateral: 3.2 cm

## 2023-11-26 ENCOUNTER — Encounter: Payer: Self-pay | Admitting: Dermatology

## 2023-12-26 ENCOUNTER — Ambulatory Visit: Payer: Medicare Other | Admitting: Physician Assistant

## 2023-12-27 NOTE — Progress Notes (Unsigned)
Cardiology Office Note    Date:  12/28/2023   ID:  Amanda Dalton, DOB 08-Nov-1931, MRN [id number]  PCP:  Jerl Mina, MD  Cardiologist:  Julien Nordmann, MD  Electrophysiologist:  Sherryl Manges, MD   Chief Complaint: Follow-up  History of Present Illness:   Amanda Dalton is a 89 y.o. female with history of HFmrEF, persistent A-fib/flutter on apixaban managed by EP, pulmonary hypertension, breast cancer status post right-sided modified radical mastectomy and chemotherapy in 1996, endometrial cancer status post hysterectomy in 2002, aortic atherosclerosis, HTN, HLD, hypothyroidism, asthma, and hiatal/ventral hernia with recurrent SBO who presents for follow-up of cardiomyopathy and blood pressure.   Amanda Dalton was diagnosed with atrial flutter in 10/2016 and underwent DCCV after adequate anticoagulation.  Echo in 11/2016 showed an EF of 50 to 55%, no regional wall motion abnormalities, mild focal basal hypertrophy of the septum, grade 2 diastolic dysfunction, mild mitral regurgitation, mildly dilated left atrium, normal RV systolic function and ventricular cavity size, and moderately elevated RVSP estimated at 50 to 55 mmHg.  Amanda Dalton was later diagnosed with A-fib in 2019, initially with plans for DCCV, though pharmacologically cardioverted on atenolol and amiodarone prior to procedure.  Zio patch in 06/2018 showed a predominant rhythm of sinus with an average rate of 57 bpm.  There was 27% A-fib burden with an average ventricular rate of 74 bpm with the longest episode lasting 2 days and 8 hours, 8 episodes of SVT lasting up to 6 beats, occasional PACs, and rare atrial couplets, triplets PVCs, and ventricular couplets.  Given issues with bradycardia while in sinus rhythm, Amanda Dalton was referred to EP with recommendation to discontinue amiodarone and atenolol with watchful waiting.  Due to recurrent arrhythmia Amanda Dalton underwent DCCV in 12/2019 with recurrence of A-fib, and in follow-up in 01/2020 there was subsequent  spontaneous conversion to sinus rhythm.  Amanda Dalton subsequently redeveloped A-fib/flutter and has been rate controlled since 2022.   Amanda Dalton was admitted to the hospital in 07/2023 with a recurrent SBO with chronic ventral hernia that was conservatively managed.  Hemoccult positive with an admission hemoglobin of 11.8 (consistent with prior readings).  At time of discharge, her furosemide and hydralazine were discontinued.   Amanda Dalton contacted our office on 08/17/2023 with elevated BP readings in the 160s to 170s systolic and lower extremity swelling.  Historically, Amanda Dalton was to take furosemide or hydralazine if systolic blood pressure was greater than 160.  Amanda Dalton took spironolactone with some improvement in leg swelling.  Amanda Dalton was seen in follow-up on 08/19/2023 noting some improvement in bilateral ankle edema following spironolactone.  Amanda Dalton had been snacking on salty foods.  It was recommended Amanda Dalton take spironolactone 25 mg daily along with atenolol 12.5 mg daily as well as as needed furosemide no more than 2 to 3 days/week.  Echo on 09/15/2023 showed an EF of 45 to 50%, global hypokinesis, normal RV systolic function and ventricular cavity size, severely dilated left atrium, moderate mitral regurgitation, mild to moderate tricuspid regurgitation, aortic valve sclerosis without evidence of stenosis, and an estimated right atrial pressure of 3 mmHg.  With mildly reduced EF, it was recommended Amanda Dalton stop atenolol and start carvedilol with continuation of spironolactone.  In follow-up Amanda Dalton preferred to continue current medical therapy and pursue a limited echo in several months to reevaluate LV systolic function.  Amanda Dalton was most recently seen on 11/01/2023 and remained without symptoms of angina or cardiac decompensation.  Blood pressures were well-controlled, and Amanda Dalton was euvolemic.  Limited echo on 11/24/2023  showed an EF of 55 to 60%, no regional wall motion abnormalities, normal RV systolic function and ventricular cavity size, elevated  RVSP estimated at 72.5 mmHg (improved from 77.9 in 09/2023), moderately dilated left atrium, moderate mitral regurgitation, and moderate to severe tricuspid regurgitation.  Amanda Dalton comes in doing well from a cardiac perspective and is without symptoms of angina or cardiac decompensation.  Does notice some fatigue.  No dizziness, presyncope, or syncope.  No falls, hematochezia, or melena.  Weight stable.  Lower extremity swelling stable.  Weight stable.   Labs independently reviewed: 09/2023 - BUN 19, serum creatinine 0.87, potassium 4.6 07/2023 - albumin 3.5, AST/ALT normal, Hgb 11.8, PLT 295 02/2023 - TC 193, TG 86, HDL 76, LDL 100, TSH normal 12/2022 - magnesium 1.9  Past Medical History:  Diagnosis Date   Anxiety    Asthma    Atrial flutter (HCC)    a. Dx 11/17/2016-->CHA2DS2VASc = 3-->Eliquis 5 mg BID.   Breast cancer (HCC)    a. 1996 s/p R modified radical mastectomy-->chemo w/ tamoxifen.   Endometrial cancer (HCC)    a. 08/2001 s/p hysterectomy. No node involvement.   Fracture of right humerus 11/2008   Hypothyroidism    Osteoporosis    Squamous cell carcinoma of skin 01/29/2010   Left superior side. KA, WD SCC not entirely excluded.    Past Surgical History:  Procedure Laterality Date   CARDIOVERSION N/A 04/19/2018   Procedure: CARDIOVERSION;  Surgeon: Antonieta Iba, MD;  Location: [facility] ORS;  Service: Cardiovascular;  Laterality: N/A;   CARDIOVERSION N/A 01/18/2020   Procedure: CARDIOVERSION;  Surgeon: Antonieta Iba, MD;  Location: [facility] ORS;  Service: Cardiovascular;  Laterality: N/A;   Right Mastectomy  1996   VAGINAL HYSTERECTOMY  08/2001    Current Medications: Current Meds  Medication Sig   apixaban (ELIQUIS) 5 MG TABS tablet Take 1 tablet by mouth twice daily   ascorbic acid (VITAMIN C) 500 MG tablet Take 500 mg by mouth daily in the afternoon.   Calcium Carb-Cholecalciferol (CALCIUM-VITAMIN D) 600-400 MG-UNIT TABS Take 1 tablet by mouth daily.   cetirizine  (ZYRTEC) 10 MG tablet Take 1 tablet (10 mg total) by mouth daily.   citalopram (CELEXA) 40 MG tablet Take 1 tablet (40 mg total) by mouth daily.   fluticasone-salmeterol (ADVAIR HFA) 45-21 MCG/ACT inhaler Inhale 2 puffs into the lungs at bedtime as needed (asthma symptoms).   furosemide (LASIX) 20 MG tablet Take 1 tablet (20 mg total) by mouth daily.   ipratropium (ATROVENT) 0.06 % nasal spray Place 2 sprays into both nostrils 3 (three) times daily as needed for rhinitis.   levothyroxine (SYNTHROID, LEVOTHROID) 75 MCG tablet Take 75 mcg by mouth daily before breakfast.   montelukast (SINGULAIR) 10 MG tablet Take 1 tablet (10 mg total) by mouth at bedtime.   Multiple Vitamin (MULTIVITAMIN WITH MINERALS) TABS tablet Take 1 tablet by mouth daily.   spironolactone (ALDACTONE) 25 MG tablet Take 1 tablet (25 mg total) by mouth daily.   [DISCONTINUED] carvedilol (COREG) 3.125 MG tablet Take 1 tablet (3.125 mg total) by mouth 2 (two) times daily.    Allergies:   Paroxetine hcl   Social History   Socioeconomic History   Marital status: Married    Spouse name: Not on file   Number of children: Not on file   Years of education: Not on file   Highest education level: Not on file  Occupational History   Occupation: Retired  Tobacco Use   Smoking  status: Never    Passive exposure: Never   Smokeless tobacco: Never  Vaping Use   Vaping status: Never Used  Substance and Sexual Activity   Alcohol use: Yes    Alcohol/week: 1.0 standard drink of alcohol    Types: 1 Glasses of wine per week    Comment: per day   Drug use: No   Sexual activity: Never  Other Topics Concern   Not on file  Social History Narrative   Lives locally with husband - Metallurgist   Social Drivers of Health   Financial Resource Strain: Not on file  Food Insecurity: No Food Insecurity (08/11/2023)   Hunger Vital Sign    Worried About Running Out of Food in the Last Year: Never true    Ran Out of Food in the Last Year:  Never true  Transportation Needs: No Transportation Needs (08/11/2023)   PRAPARE - Administrator, Civil Service (Medical): No    Lack of Transportation (Non-Medical): No  Physical Activity: Not on file  Stress: Not on file  Social Connections: Not on file     Family History:  The patient's family history includes Breast cancer in her sister; Hypertension in her mother; Osteoporosis in her sister; Stroke in her father and mother.  ROS:   12-point review of systems is negative unless otherwise noted in the HPI.   EKGs/Labs/Other Studies Reviewed:    Studies reviewed were summarized above. The additional studies were reviewed today:  Limited echo 11/24/2023: 1. Left ventricular ejection fraction, by estimation, is 55 to 60%. Left  ventricular ejection fraction by 3D volume is 59 %. The left ventricle has  normal function. The left ventricle has no regional wall motion  abnormalities. The average left  ventricular global longitudinal strain is -20.3 %.   2. Right ventricular systolic function is normal. The right ventricular  size is normal. There is severely elevated pulmonary artery systolic  pressure. The estimated right ventricular systolic pressure is 72.5 mmHg.   3. Left atrial size was moderately dilated.   4. The mitral valve is normal in structure. Moderate mitral valve  regurgitation. No evidence of mitral stenosis.   5. Tricuspid valve regurgitation is moderate to severe.   6. The aortic valve is normal in structure. Aortic valve regurgitation is  not visualized. No aortic stenosis is present.   7. The inferior vena cava is normal in size with greater than 50%  respiratory variability, suggesting right atrial pressure of 3 mmHg.  __________  Limited echo 10/11/2023: 1. Left ventricular ejection fraction, by estimation, is 55 to 60%. The  left ventricle has normal function. The left ventricle has no regional  wall motion abnormalities. There is mild left  ventricular hypertrophy.   2. Right ventricular systolic function is normal. The right ventricular  size is normal. There is severely elevated pulmonary artery systolic  pressure. The estimated right ventricular systolic pressure is 77.9 mmHg.   3. The mitral valve is normal in structure. Moderate to severe mitral  valve regurgitation. No evidence of mitral stenosis.   4. Tricuspid valve regurgitation is moderate to severe.   5. The aortic valve has an indeterminant number of cusps. Aortic valve  regurgitation is not visualized. No aortic stenosis is present.   6. The inferior vena cava is dilated in size with >50% respiratory  variability, suggesting right atrial pressure of 8 mmHg.  __________   2D echo 09/15/2023: 1. Left ventricular ejection fraction, by estimation, is 45 to  50%. Left  ventricular ejection fraction by PLAX is 51 %. The left ventricle has  mildly decreased function. The left ventricle demonstrates global  hypokinesis. Left ventricular diastolic  parameters are indeterminate. The average left ventricular global  longitudinal strain is -13.9 %.   2. Right ventricular systolic function is normal. The right ventricular  size is normal.   3. Left atrial size was severely dilated.   4. The mitral valve is normal in structure. Moderate mitral valve  regurgitation. No evidence of mitral stenosis.   5. Tricuspid valve regurgitation is mild to moderate.   6. The aortic valve is normal in structure. Aortic valve regurgitation is  not visualized. Aortic valve sclerosis is present, with no evidence of  aortic valve stenosis.   7. The inferior vena cava is normal in size with greater than 50%  respiratory variability, suggesting right atrial pressure of 3 mmHg.   8. Rhythm concerning for atrial flutter  __________   Zio patch 06/2018: Normal sinus rhythm max HR of 118 bpm, and avg HR of 57 bpm.   Paroxysmal atrial fibrillation Atrial Fibrillation occurred (27% burden),  ranging from 47- 118 bpm (avg of 74 bpm), the longest lasting 2 days 8 hours with an avg rate of 72 bpm.    8 Supraventricular Tachycardia/atrial tachycardia runs occurred, the run with the fastest interval lasting 4 beats with a max rate of 109 bpm, the longest lasting 6 beats with an avg rate of 92 bpm. Isolated SVEs were occasional (3.6%, 40182), SVE Couplets were rare (<1.0%, 233), and SVE Triplets were rare (<1.0%, 8). Isolated VEs were rare (<1.0%), VE Couplets were rare (<1.0%), and no VE Triplets were present. Ventricular Bigeminy and Trigeminy were present. __________   2D echo 12/08/2016: - Left ventricle: The cavity size was normal. There was mild focal    basal hypertrophy of the septum. Systolic function was normal.    The estimated ejection fraction was in the range of 50% to 55%.    Wall motion was normal; there were no regional wall motion    abnormalities. Features are consistent with a pseudonormal left    ventricular filling pattern, with concomitant abnormal relaxation    and increased filling pressure (grade 2 diastolic dysfunction).  - Aortic valve: Mildly thickened leaflets. Transvalvular velocity    was within the normal range. There was no stenosis. There was no    regurgitation.  - Mitral valve: Mildly thickened leaflets . There was mild    regurgitation.  - Left atrium: The atrium was mildly dilated.  - Right ventricle: The cavity size was normal. Systolic function    was normal.  - Tricuspid valve: Structurally normal valve. There was mild    regurgitation.  - Pulmonary arteries: Systolic pressure was moderately increased,    in the range of 50 mm Hg to 55 mm Hg.  - Inferior vena cava: The vessel was dilated. The respirophasic    diameter changes were blunted (< 50%), consistent with elevated    central venous pressure.    EKG:  EKG is ordered today.  The EKG ordered today demonstrates sinus bradycardia, 48 bpm, rare PAC, no acute st/t changes  Recent  Labs: 01/15/2023: Magnesium 1.9 08/11/2023: ALT 12; Hemoglobin 11.8; Platelets 295 10/20/2023: BUN 19; Creatinine, Ser 0.87; Potassium 4.6; Sodium 143  Recent Lipid Panel No results found for: "CHOL", "TRIG", "HDL", "CHOLHDL", "VLDL", "LDLCALC", "LDLDIRECT"  PHYSICAL EXAM:    VS:  BP 118/72 (BP Location: Left Arm, Patient Position: Sitting,  Cuff Size: Normal)   Pulse (!) 48   Ht 5' (1.524 m)   Wt 138 lb (62.6 kg)   SpO2 97%   BMI 26.95 kg/m   BMI: Body mass index is 26.95 kg/m.  Physical Exam Vitals reviewed.  Constitutional:      Appearance: Amanda Dalton is well-developed.  HENT:     Head: Normocephalic and atraumatic.  Eyes:     General:        Right eye: No discharge.        Left eye: No discharge.  Neck:     Vascular: No JVD.  Cardiovascular:     Rate and Rhythm: Regular rhythm. Bradycardia present.     Heart sounds: Normal heart sounds, S1 normal and S2 normal. Heart sounds not distant. No midsystolic click and no opening snap. No murmur heard.    No friction rub.  Pulmonary:     Effort: Pulmonary effort is normal. No respiratory distress.     Breath sounds: Normal breath sounds. No decreased breath sounds, wheezing, rhonchi or rales.  Chest:     Chest wall: No tenderness.  Abdominal:     General: There is no distension.  Musculoskeletal:     Cervical back: Normal range of motion.  Skin:    General: Skin is warm and dry.     Nails: There is no clubbing.  Neurological:     Mental Status: Amanda Dalton is alert and oriented to person, place, and time.  Psychiatric:        Speech: Speech normal.        Behavior: Behavior normal.        Thought Content: Thought content normal.        Judgment: Judgment normal.     Wt Readings from Last 3 Encounters:  12/28/23 138 lb (62.6 kg)  11/01/23 136 lb 9.6 oz (62 kg)  09/26/23 137 lb (62.1 kg)     ASSESSMENT & PLAN:   HFimpEF: Euvolemic and well compensated.  Given bradycardia, stop carvedilol.  Amanda Dalton otherwise remains on  spironolactone and furosemide with recent labs stable.  With normalization of LV systolic function, in the context of advanced age, defer addition of SGLT2 inhibitor or ACE inhibitor/ARB.  Pulmonary hypertension: Asymptomatic.  Remains on spironolactone and furosemide.  Persistent A-fib/flutter: Sinus rhythm with bradycardic rate in the office today.  Stop carvedilol.  CHA2DS2-VASc at least 6 (CHF, HTN, age x 2, vascular disease, sex category).  Amanda Dalton remains on apixaban 5 mg twice daily and does not meet reduced dosing criteria.  No falls or symptoms concerning for bleeding.  Recent labs stable.  Managed by EP.  HTN: Blood pressure is well-controlled in the office.  Remains on spironolactone as above.  Will need to monitor blood pressure while off carvedilol.  Aortic atherosclerosis/HLD: LDL 100 in 02/2023.  Has historically declined statin.      Disposition: F/u with Dr. Mariah Milling or an APP in 2 months, and EP as directed.    Medication Adjustments/Labs and Tests Ordered: Current medicines are reviewed at length with the patient today.  Concerns regarding medicines are outlined above. Medication changes, Labs and Tests ordered today are summarized above and listed in the Patient Instructions accessible in Encounters.   Signed, Eula Listen, PA-C 12/28/2023 4:56 PM     [facility] [place] [address] [place], [place] [address] [phone]

## 2023-12-28 ENCOUNTER — Encounter: Payer: Self-pay | Admitting: Physician Assistant

## 2023-12-28 ENCOUNTER — Ambulatory Visit: Payer: Medicare Other | Attending: Physician Assistant | Admitting: Physician Assistant

## 2023-12-28 VITALS — BP 118/72 | HR 48 | Ht 60.0 in | Wt 138.0 lb

## 2023-12-28 DIAGNOSIS — I10 Essential (primary) hypertension: Secondary | ICD-10-CM

## 2023-12-28 DIAGNOSIS — E782 Mixed hyperlipidemia: Secondary | ICD-10-CM | POA: Diagnosis present

## 2023-12-28 DIAGNOSIS — I5032 Chronic diastolic (congestive) heart failure: Secondary | ICD-10-CM | POA: Diagnosis present

## 2023-12-28 DIAGNOSIS — I4819 Other persistent atrial fibrillation: Secondary | ICD-10-CM

## 2023-12-28 DIAGNOSIS — I272 Pulmonary hypertension, unspecified: Secondary | ICD-10-CM

## 2023-12-28 DIAGNOSIS — I700 Atherosclerosis of aorta: Secondary | ICD-10-CM

## 2023-12-28 NOTE — Patient Instructions (Signed)
Medication Instructions:  Your physician recommends the following medication changes.  STOP TAKING: Coreg    *If you need a refill on your cardiac medications before your next appointment, please call your pharmacy*   Lab Work: None ordered at this time    Follow-Up: At [facility], you and your health needs are our priority.  As part of our continuing mission to provide you with exceptional heart care, we have created designated Provider Care Teams.  These Care Teams include your primary Cardiologist (physician) and Advanced Practice Providers (APPs -  Physician Assistants and Nurse Practitioners) who all work together to provide you with the care you need, when you need it.  We recommend signing up for the patient portal called "MyChart".  Sign up information is provided on this After Visit Summary.  MyChart is used to connect with patients for Virtual Visits (Telemedicine).  Patients are able to view lab/test results, encounter notes, upcoming appointments, etc.  Non-urgent messages can be sent to your provider as well.   To learn more about what you can do with MyChart, go to [email or web address].    Your next appointment:   2 month(s)  Provider:   You may see Julien Nordmann, MD or one of the following Advanced Practice Providers on your designated Care Team:   Eula Listen, [place]

## 2024-01-10 ENCOUNTER — Other Ambulatory Visit: Payer: Self-pay | Admitting: Cardiovascular Disease

## 2024-01-10 DIAGNOSIS — I4819 Other persistent atrial fibrillation: Secondary | ICD-10-CM

## 2024-01-10 NOTE — Telephone Encounter (Signed)
Prescription refill request for Eliquis received. Indication: PAF Last office visit: 12/28/23  R Dunn PA-C Scr: 0.87 on 10/20/23  Epic Age: 89 Weight: 62.6kg  Based on above findings Eliquis 5mg  twice daily is the appropriate dose.  Refill approved.

## 2024-02-27 NOTE — Progress Notes (Unsigned)
 Cardiology Office Note    Date:  03/01/2024   ID:  Amanda Dalton, DOB 01-26-1931, MRN [id number]  PCP:  Jerl Mina, MD  Cardiologist:  Julien Nordmann, MD  Electrophysiologist:  Sherryl Manges, MD   Chief Complaint: Follow-up  History of Present Illness:   Amanda Dalton is a 89 y.o. female with history of HFimpEF, persistent A-fib/flutter on apixaban, pulmonary hypertension, breast cancer status post right-sided modified radical mastectomy and chemotherapy in 1996, endometrial cancer status post hysterectomy in 2002, aortic atherosclerosis, HTN, HLD, hypothyroidism, asthma, and hiatal/ventral hernia with recurrent SBO who presents for follow-up of cardiomyopathy and blood pressure.   She was diagnosed with atrial flutter in 10/2016 and underwent DCCV after adequate anticoagulation.  Echo in 11/2016 showed an EF of 50 to 55%, no regional wall motion abnormalities, mild focal basal hypertrophy of the septum, grade 2 diastolic dysfunction, mild mitral regurgitation, mildly dilated left atrium, normal RV systolic function and ventricular cavity size, and moderately elevated RVSP estimated at 50 to 55 mmHg.  She was later diagnosed with A-fib in 2019, initially with plans for DCCV, though pharmacologically cardioverted on atenolol and amiodarone prior to procedure.  Zio patch in 06/2018 showed a predominant rhythm of sinus with an average rate of 57 bpm.  There was 27% A-fib burden with an average ventricular rate of 74 bpm with the longest episode lasting 2 days and 8 hours, 8 episodes of SVT lasting up to 6 beats, occasional PACs, and rare atrial couplets, triplets PVCs, and ventricular couplets.  Given issues with bradycardia while in sinus rhythm, she was referred to EP with recommendation to discontinue amiodarone and atenolol with watchful waiting.  Due to recurrent arrhythmia she underwent DCCV in 12/2019 with recurrence of A-fib, and in follow-up in 01/2020 there was subsequent spontaneous conversion  to sinus rhythm.  She subsequently redeveloped A-fib/flutter and has been rate controlled since 2022.   She was admitted to the hospital in 07/2023 with a recurrent SBO with chronic ventral hernia that was conservatively managed.  Hemoccult positive with an admission hemoglobin of 11.8 (consistent with prior readings).  At time of discharge, her furosemide and hydralazine were discontinued.   She contacted our office on 08/17/2023 with elevated BP readings in the 160s to 170s systolic and lower extremity swelling.  Historically, she was to take furosemide or hydralazine if systolic blood pressure was greater than 160.  She took spironolactone with some improvement in leg swelling.  She was seen in follow-up on 08/19/2023 noting some improvement in bilateral ankle edema following spironolactone.  She had been snacking on salty foods.  It was recommended she take spironolactone 25 mg daily along with atenolol 12.5 mg daily as well as as needed furosemide no more than 2 to 3 days/week.  Echo on 09/15/2023 showed an EF of 45 to 50%, global hypokinesis, normal RV systolic function and ventricular cavity size, severely dilated left atrium, moderate mitral regurgitation, mild to moderate tricuspid regurgitation, aortic valve sclerosis without evidence of stenosis, and an estimated right atrial pressure of 3 mmHg.  With mildly reduced EF, it was recommended she stop atenolol and start carvedilol with continuation of spironolactone.  In follow-up she preferred to continue current medical therapy and pursue a limited echo in several months to reevaluate LV systolic function.  She was seen on 11/01/2023 and remained without symptoms of angina or cardiac decompensation.  Blood pressures were well-controlled, and she was euvolemic.  Limited echo on 11/24/2023 showed an EF of 55  to 60%, no regional wall motion abnormalities, normal RV systolic function and ventricular cavity size, elevated RVSP estimated at 72.5 mmHg (improved  from 77.9 in 09/2023), moderately dilated left atrium, moderate mitral regurgitation, and moderate to severe tricuspid regurgitation.  She was last seen in the office in 11/2023 and was without symptoms of angina or cardiac decompensation.  She was fatigued and bradycardic with a heart rate of 48 bpm leading carvedilol to be discontinued.  She reports her husband passed away on 2024-02-25.  She is still in some shock regarding this.  Good support system with friends, neighbors, and niece/nephew.  She is without symptoms of angina or cardiac decompensation.  No significant dyspnea, orthopnea, lower extremity swelling, or abdominal distention.  Heart rate and fatigue have improved off carvedilol.  No falls or symptoms concerning for bleeding.  No acute cardiac concerns at this time.   Labs independently reviewed: 01/2024 - potassium 4.6, BUN 20, serum creatinine 1 07/2023 - albumin 3.5, AST/ALT normal, Hgb 11.8, PLT 295 02/2023 - TC 193, TG 86, HDL 76, LDL 100, TSH normal 12/2022 - magnesium 1.9  Past Medical History:  Diagnosis Date   Anxiety    Asthma    Atrial flutter (HCC)    a. Dx 11/17/2016-->CHA2DS2VASc = 3-->Eliquis 5 mg BID.   Breast cancer (HCC)    a. 1996 s/p R modified radical mastectomy-->chemo w/ tamoxifen.   Endometrial cancer (HCC)    a. 08/2001 s/p hysterectomy. No node involvement.   Fracture of right humerus 11/2008   Hypothyroidism    Osteoporosis    Squamous cell carcinoma of skin 01/29/2010   Left superior side. KA, WD SCC not entirely excluded.    Past Surgical History:  Procedure Laterality Date   CARDIOVERSION N/A 04/19/2018   Procedure: CARDIOVERSION;  Surgeon: Antonieta Iba, MD;  Location: [facility] ORS;  Service: Cardiovascular;  Laterality: N/A;   CARDIOVERSION N/A 01/18/2020   Procedure: CARDIOVERSION;  Surgeon: Antonieta Iba, MD;  Location: [facility] ORS;  Service: Cardiovascular;  Laterality: N/A;   Right Mastectomy  1996   VAGINAL HYSTERECTOMY  08/2001     Current Medications: Current Meds  Medication Sig   ascorbic acid (VITAMIN C) 500 MG tablet Take 500 mg by mouth daily in the afternoon.   Calcium Carb-Cholecalciferol (CALCIUM-VITAMIN D) 600-400 MG-UNIT TABS Take 1 tablet by mouth daily.   cetirizine (ZYRTEC) 10 MG tablet Take 1 tablet (10 mg total) by mouth daily.   citalopram (CELEXA) 40 MG tablet Take 1 tablet (40 mg total) by mouth daily.   fluticasone-salmeterol (ADVAIR HFA) 45-21 MCG/ACT inhaler Inhale 2 puffs into the lungs at bedtime as needed (asthma symptoms).   ipratropium (ATROVENT) 0.06 % nasal spray Place 2 sprays into both nostrils 3 (three) times daily as needed for rhinitis.   levothyroxine (SYNTHROID, LEVOTHROID) 75 MCG tablet Take 75 mcg by mouth daily before breakfast.   montelukast (SINGULAIR) 10 MG tablet Take 1 tablet (10 mg total) by mouth at bedtime.   Multiple Vitamin (MULTIVITAMIN WITH MINERALS) TABS tablet Take 1 tablet by mouth daily.   [DISCONTINUED] apixaban (ELIQUIS) 5 MG TABS tablet Take 1 tablet by mouth twice daily   [DISCONTINUED] furosemide (LASIX) 20 MG tablet Take 1 tablet (20 mg total) by mouth daily.   [DISCONTINUED] spironolactone (ALDACTONE) 25 MG tablet Take 1 tablet (25 mg total) by mouth daily.    Allergies:   Paroxetine hcl   Social History   Socioeconomic History   Marital status: Widowed    Spouse  name: Not on file   Number of children: 0   Years of education: Not on file   Highest education level: Not on file  Occupational History   Occupation: Retired  Tobacco Use   Smoking status: Never    Passive exposure: Never   Smokeless tobacco: Never  Vaping Use   Vaping status: Never Used  Substance and Sexual Activity   Alcohol use: Yes    Alcohol/week: 1.0 standard drink of alcohol    Types: 1 Glasses of wine per week    Comment: per day   Drug use: No   Sexual activity: Never  Other Topics Concern   Not on file  Social History Narrative   Lives locally with husband -  Metallurgist   Social Drivers of Health   Financial Resource Strain: Not on file  Food Insecurity: No Food Insecurity (08/11/2023)   Hunger Vital Sign    Worried About Running Out of Food in the Last Year: Never true    Ran Out of Food in the Last Year: Never true  Transportation Needs: No Transportation Needs (08/11/2023)   PRAPARE - Administrator, Civil Service (Medical): No    Lack of Transportation (Non-Medical): No  Physical Activity: Not on file  Stress: Not on file  Social Connections: Not on file     Family History:  The patient's family history includes Breast cancer in her sister; Hypertension in her mother; Osteoporosis in her sister; Stroke in her father and mother.  ROS:   12-point review of systems is negative unless otherwise noted in the HPI.   EKGs/Labs/Other Studies Reviewed:    Studies reviewed were summarized above. The additional studies were reviewed today:  Limited echo 11/24/2023: 1. Left ventricular ejection fraction, by estimation, is 55 to 60%. Left  ventricular ejection fraction by 3D volume is 59 %. The left ventricle has  normal function. The left ventricle has no regional wall motion  abnormalities. The average left  ventricular global longitudinal strain is -20.3 %.   2. Right ventricular systolic function is normal. The right ventricular  size is normal. There is severely elevated pulmonary artery systolic  pressure. The estimated right ventricular systolic pressure is 72.5 mmHg.   3. Left atrial size was moderately dilated.   4. The mitral valve is normal in structure. Moderate mitral valve  regurgitation. No evidence of mitral stenosis.   5. Tricuspid valve regurgitation is moderate to severe.   6. The aortic valve is normal in structure. Aortic valve regurgitation is  not visualized. No aortic stenosis is present.   7. The inferior vena cava is normal in size with greater than 50%  respiratory variability, suggesting right  atrial pressure of 3 mmHg.  __________   Limited echo 10/11/2023: 1. Left ventricular ejection fraction, by estimation, is 55 to 60%. The  left ventricle has normal function. The left ventricle has no regional  wall motion abnormalities. There is mild left ventricular hypertrophy.   2. Right ventricular systolic function is normal. The right ventricular  size is normal. There is severely elevated pulmonary artery systolic  pressure. The estimated right ventricular systolic pressure is 77.9 mmHg.   3. The mitral valve is normal in structure. Moderate to severe mitral  valve regurgitation. No evidence of mitral stenosis.   4. Tricuspid valve regurgitation is moderate to severe.   5. The aortic valve has an indeterminant number of cusps. Aortic valve  regurgitation is not visualized. No aortic stenosis is present.  6. The inferior vena cava is dilated in size with >50% respiratory  variability, suggesting right atrial pressure of 8 mmHg.  __________   2D echo 09/15/2023: 1. Left ventricular ejection fraction, by estimation, is 45 to 50%. Left  ventricular ejection fraction by PLAX is 51 %. The left ventricle has  mildly decreased function. The left ventricle demonstrates global  hypokinesis. Left ventricular diastolic  parameters are indeterminate. The average left ventricular global  longitudinal strain is -13.9 %.   2. Right ventricular systolic function is normal. The right ventricular  size is normal.   3. Left atrial size was severely dilated.   4. The mitral valve is normal in structure. Moderate mitral valve  regurgitation. No evidence of mitral stenosis.   5. Tricuspid valve regurgitation is mild to moderate.   6. The aortic valve is normal in structure. Aortic valve regurgitation is  not visualized. Aortic valve sclerosis is present, with no evidence of  aortic valve stenosis.   7. The inferior vena cava is normal in size with greater than 50%  respiratory variability,  suggesting right atrial pressure of 3 mmHg.   8. Rhythm concerning for atrial flutter  __________   Zio patch 06/2018: Normal sinus rhythm max HR of 118 bpm, and avg HR of 57 bpm.   Paroxysmal atrial fibrillation Atrial Fibrillation occurred (27% burden), ranging from 47- 118 bpm (avg of 74 bpm), the longest lasting 2 days 8 hours with an avg rate of 72 bpm.    8 Supraventricular Tachycardia/atrial tachycardia runs occurred, the run with the fastest interval lasting 4 beats with a max rate of 109 bpm, the longest lasting 6 beats with an avg rate of 92 bpm. Isolated SVEs were occasional (3.6%, 40182), SVE Couplets were rare (<1.0%, 233), and SVE Triplets were rare (<1.0%, 8). Isolated VEs were rare (<1.0%), VE Couplets were rare (<1.0%), and no VE Triplets were present. Ventricular Bigeminy and Trigeminy were present. __________   2D echo 12/08/2016: - Left ventricle: The cavity size was normal. There was mild focal    basal hypertrophy of the septum. Systolic function was normal.    The estimated ejection fraction was in the range of 50% to 55%.    Wall motion was normal; there were no regional wall motion    abnormalities. Features are consistent with a pseudonormal left    ventricular filling pattern, with concomitant abnormal relaxation    and increased filling pressure (grade 2 diastolic dysfunction).  - Aortic valve: Mildly thickened leaflets. Transvalvular velocity    was within the normal range. There was no stenosis. There was no    regurgitation.  - Mitral valve: Mildly thickened leaflets . There was mild    regurgitation.  - Left atrium: The atrium was mildly dilated.  - Right ventricle: The cavity size was normal. Systolic function    was normal.  - Tricuspid valve: Structurally normal valve. There was mild    regurgitation.  - Pulmonary arteries: Systolic pressure was moderately increased,    in the range of 50 mm Hg to 55 mm Hg.  - Inferior vena cava: The vessel was  dilated. The respirophasic    diameter changes were blunted (< 50%), consistent with elevated    central venous pressure.    EKG:  EKG is not ordered today.   Recent Labs: 08/11/2023: ALT 12; Hemoglobin 11.8; Platelets 295 10/20/2023: BUN 19; Creatinine, Ser 0.87; Potassium 4.6; Sodium 143  Recent Lipid Panel No results found for: "CHOL", "TRIG", "HDL", "  CHOLHDL", "VLDL", "LDLCALC", "LDLDIRECT"  PHYSICAL EXAM:    VS:  BP 130/62 (BP Location: Left Arm, Patient Position: Sitting, Cuff Size: Normal)   Pulse 71   Resp 16   Ht 5' (1.524 m)   Wt 137 lb (62.1 kg)   SpO2 98%   BMI 26.76 kg/m   BMI: Body mass index is 26.76 kg/m.  Physical Exam Vitals reviewed.  Constitutional:      Appearance: She is well-developed.  HENT:     Head: Normocephalic and atraumatic.  Eyes:     General:        Right eye: No discharge.        Left eye: No discharge.  Cardiovascular:     Rate and Rhythm: Normal rate. Rhythm irregularly irregular.     Heart sounds: Normal heart sounds, S1 normal and S2 normal. Heart sounds not distant. No midsystolic click and no opening snap. No murmur heard.    No friction rub.  Pulmonary:     Effort: Pulmonary effort is normal. No respiratory distress.     Breath sounds: Normal breath sounds. No decreased breath sounds, wheezing, rhonchi or rales.  Chest:     Chest wall: No tenderness.  Musculoskeletal:     Cervical back: Normal range of motion.     Right lower leg: No edema.     Left lower leg: No edema.  Skin:    General: Skin is warm and dry.     Nails: There is no clubbing.  Neurological:     Mental Status: She is alert and oriented to person, place, and time.  Psychiatric:        Speech: Speech normal.        Behavior: Behavior normal.        Thought Content: Thought content normal.        Judgment: Judgment normal.     Wt Readings from Last 3 Encounters:  03/01/24 137 lb (62.1 kg)  12/28/23 138 lb (62.6 kg)  11/01/23 136 lb 9.6 oz (62 kg)      ASSESSMENT & PLAN:   HFimpEF: Euvolemic and well compensated.  She remains on spironolactone 25 mg and furosemide 20 mg.  No longer on carvedilol secondary to fatigue and bradycardia.  In the context of normalization of LV systolic function, and with lack of heart failure symptoms, we will defer addition of SGLT2 inhibitor (concern for off target effect with age) or ACE inhibitor/ARB.  Pulmonary hypertension: Asymptomatic.  Remains on spironolactone and furosemide as above.  Persistent A-fib/flutter: Rate controlled not requiring AV nodal blocking medication given prior bradycardia and fatigue.  CHA2DS2-VASc at least 6 (CHF, HTN, age x 2, vascular disease, sex category).  She remains on apixaban 5 mg twice daily does not meet reduced dosing criteria.  No falls or symptoms concerning for bleeding.  Recent labs stable.  Followed by EP.  HTN: Blood pressure is well-controlled the office today.  She remains on spironolactone as above.  Aortic atherosclerosis/HLD: LDL 100 and 02/2023.  Has historically declined statin.  Bradycardia: Resolved following discontinuation of carvedilol.  No indication for PPM at this time.     Disposition: F/u with Dr. Mariah Milling or an APP in 2 months, and EP as directed.    Medication Adjustments/Labs and Tests Ordered: Current medicines are reviewed at length with the patient today.  Concerns regarding medicines are outlined above. Medication changes, Labs and Tests ordered today are summarized above and listed in the Patient Instructions accessible in Encounters.  Signed, Eula Listen, PA-C 03/01/2024 12:22 PM     [facility] [place] [address] [place], [place] [address] [phone]

## 2024-03-01 ENCOUNTER — Ambulatory Visit: Payer: Medicare Other | Attending: Physician Assistant | Admitting: Physician Assistant

## 2024-03-01 ENCOUNTER — Encounter: Payer: Self-pay | Admitting: Physician Assistant

## 2024-03-01 VITALS — BP 130/62 | HR 71 | Resp 16 | Ht 60.0 in | Wt 137.0 lb

## 2024-03-01 DIAGNOSIS — I10 Essential (primary) hypertension: Secondary | ICD-10-CM | POA: Insufficient documentation

## 2024-03-01 DIAGNOSIS — I700 Atherosclerosis of aorta: Secondary | ICD-10-CM | POA: Insufficient documentation

## 2024-03-01 DIAGNOSIS — R001 Bradycardia, unspecified: Secondary | ICD-10-CM | POA: Diagnosis present

## 2024-03-01 DIAGNOSIS — I272 Pulmonary hypertension, unspecified: Secondary | ICD-10-CM | POA: Diagnosis not present

## 2024-03-01 DIAGNOSIS — I5032 Chronic diastolic (congestive) heart failure: Secondary | ICD-10-CM | POA: Diagnosis present

## 2024-03-01 DIAGNOSIS — E782 Mixed hyperlipidemia: Secondary | ICD-10-CM | POA: Diagnosis present

## 2024-03-01 DIAGNOSIS — I4819 Other persistent atrial fibrillation: Secondary | ICD-10-CM | POA: Diagnosis not present

## 2024-03-01 MED ORDER — SPIRONOLACTONE 25 MG PO TABS: 25.0000 mg | ORAL_TABLET | Freq: Every day | ORAL | 3 refills | Status: AC

## 2024-03-01 MED ORDER — APIXABAN 5 MG PO TABS: 5.0000 mg | ORAL_TABLET | Freq: Two times a day (BID) | ORAL | 3 refills | Status: AC

## 2024-03-01 MED ORDER — FUROSEMIDE 20 MG PO TABS: 20.0000 mg | ORAL_TABLET | Freq: Every day | ORAL | 3 refills | Status: DC

## 2024-03-01 NOTE — Patient Instructions (Signed)
 Medication Instructions:   Your Physician recommend you continue on your current medication as directed.     *If you need a refill on your cardiac medications before your next appointment, please call your pharmacy*  Lab Work: None ordered.   If you have labs (blood work) drawn today and your tests are completely normal, you will receive your results only by: MyChart Message (if you have MyChart) OR A paper copy in the mail If you have any lab test that is abnormal or we need to change your treatment, we will call you to review the results.  Testing/Procedures: None ordered.   Follow-Up: At [facility], you and your health needs are our priority.  As part of our continuing mission to provide you with exceptional heart care, our providers are all part of one team.  This team includes your primary Cardiologist (physician) and Advanced Practice Providers or APPs (Physician Assistants and Nurse Practitioners) who all work together to provide you with the care you need, when you need it.  Your next appointment:   2 month(s)  Provider:   You may see Julien Nordmann, MD or one of the following Advanced Practice Providers on your designated Care Team:   Nicolasa Ducking, NP Ames Dura, PA-C Eula Listen, PA-C Cadence Barnum, PA-C Charlsie Quest, NP Carlos Levering, NP    We recommend signing up for the patient portal called "MyChart".  Sign up information is provided on this After Visit Summary.  MyChart is used to connect with patients for Virtual Visits (Telemedicine).  Patients are able to view lab/test results, encounter notes, upcoming appointments, etc.  Non-urgent messages can be sent to your provider as well.   To learn more about what you can do with MyChart, go to [email or web address].

## 2024-03-02 ENCOUNTER — Telehealth: Payer: Self-pay | Admitting: Physician Assistant

## 2024-03-02 MED ORDER — FUROSEMIDE 20 MG PO TABS: 20.0000 mg | ORAL_TABLET | Freq: Every day | ORAL | 3 refills | Status: DC

## 2024-03-02 NOTE — Telephone Encounter (Signed)
*  STAT* If patient is at the pharmacy, call can be transferred to refill team.   1. Which medications need to be refilled? (please list name of each medication and dose if known)   furosemide (LASIX) 20 MG tablet    2. Which pharmacy/location (including street and city if local pharmacy) is medication to be sent to? [facility] - [place], [place] - [id number] Phone: [phone]  Fax: [phone]     3. Do they need a 30 day or 90 day supply? 90 Please re-send pharmacy did not get this script yesterday

## 2024-03-02 NOTE — Telephone Encounter (Signed)
 Pt's medication was sent to pt's pharmacy as requested. Confirmation received.

## 2024-05-11 ENCOUNTER — Ambulatory Visit (INDEPENDENT_AMBULATORY_CARE_PROVIDER_SITE_OTHER): Admitting: Podiatry

## 2024-05-11 DIAGNOSIS — B351 Tinea unguium: Secondary | ICD-10-CM

## 2024-05-11 DIAGNOSIS — M79675 Pain in left toe(s): Secondary | ICD-10-CM

## 2024-05-11 DIAGNOSIS — M79674 Pain in right toe(s): Secondary | ICD-10-CM | POA: Diagnosis not present

## 2024-05-14 NOTE — Progress Notes (Signed)
 Cardiology Office Note  Date:  05/15/2024   ID:  Amanda Dalton, DOB May 19, 1931, MRN [id number]  PCP:  Valora Agent, MD   Chief Complaint  Patient presents with   Follow-up    HPI:  89 y/o woman with a h/o  hypothyroidism,  anxiety,  asthma,  atrial flutter rate 120 bpm, started on anticoagulation,  previous episode of atrial flutter presented in the setting of attending a party 10/2016: fib flutter 11/2016: NSR Atrial fibrillation 5/19 again with spontaneous conversion. Cardioversion cancelled 04/19/2018 cardioversion 2/21; 3/21  who presents for  follow-up of her arrhythmia, persistent atrial fibrillation  LOV 3/24 Lost husband 3/25, has to suddenly Home alone, at [facility]  Active, walks with a cane, gait instability No falls  Denies significant shortness of breath on exertion, no abdominal distention no PND orthopnea, no leg swelling  Reports taking Lasix  20 daily with spironolactone  25 daily every morning  Maintining NSR with PACs, NSR in 12/28/23 Flutter 10/24  hospital in 07/2023 with a recurrent SBO with chronic ventral hernia that was conservatively managed.   Echo 12/24: EF 55-60%  EKG personally reviewed by myself on todays visit EKG Interpretation Date/Time:  Tuesday May 15 2024 10:35:28 EDT Ventricular Rate:  56 PR Interval:  150 QRS Duration:  82 QT Interval:  462 QTC Calculation: 445 R Axis:   -29  Text Interpretation: Sinus bradycardia with Premature atrial complexes When compared with ECG of 28-Dec-2023 14:43, No significant change was found Confirmed by Perla Lye [phone]) on 05/15/2024 10:46:01 AM     hospitalization February 2024, small bowel obstruction, vomiting Upper abdominal ventral hernia Treated conservatively with NG tube to suction Seen by surgical team Still with hernia, she declined surgery  June 2023, had a mechincal fall at home, hurt knee Significant bruising, blistering sores with swelling Was seen by [facility]  Seen by pulmonary 06/17/22, was given torsemide for leg swelling Took torsemide for one week Mild imporvement in leg swelling No DVT on u/s Not on torsemide currently Has not been taking Lasix  but has this on her list to take as needed for leg swelling, weight gain  No sx from atrial fib Weight is 10 pounds higher compared to last year  Continues to have ventral hernia, no significant pain Sleep reported periodic N/V, ABD bloating   01/2021, was in atrial fibrillation,  Does not take atenolol , amiodarone  given  bradycardia  Prior imaging reviewed with her in detail, CT scan, 01/2021 Large hiatal hernia. Ventral wall hernia in the upper abdomen containing portions of the small bowel without evidence for an obstruction. Rectosigmoid diverticulosis without acute inflammation. Kidney cyst aortic atherosclerosis   Prior history of GI bleeds, none recently,  Cardioversion cancelled 04/19/2018 Was in NSR on amiodarone  and atenolol  on arrival to the hospital She had bradycardia and amiodarone  decreased down to 100 mg daily atenolol  down to 12.5 mg daily  Previously received adenosine IV push for tachycardia revealing flutter waves CHA2DS2VASc of 3 (age/age/gender), started on eliquis  5 mg BID (creat 0.8 12/4, wt > 60 kg)  Echocardiogram 12/08/2016 Ejection fraction 50-55%, moderately elevated right heart pressures 50-55 mmHg  Had a fall Nov 2017: large laceration on forehead  PMH:   has a past medical history of Anxiety, Asthma, Atrial flutter (HCC), Breast cancer (HCC), Endometrial cancer (HCC), Fracture of right humerus (11/2008), Hypothyroidism, Osteoporosis, and Squamous cell carcinoma of skin (01/29/2010).  PSH:    Past Surgical History:  Procedure Laterality Date   CARDIOVERSION N/A 04/19/2018  Procedure: CARDIOVERSION;  Surgeon: Perla Evalene PARAS, MD;  Location: [facility] ORS;  Service: Cardiovascular;  Laterality: N/A;   CARDIOVERSION N/A 01/18/2020   Procedure:  CARDIOVERSION;  Surgeon: Perla Evalene PARAS, MD;  Location: [facility] ORS;  Service: Cardiovascular;  Laterality: N/A;   Right Mastectomy  1996   VAGINAL HYSTERECTOMY  08/2001    Current Outpatient Medications  Medication Sig Dispense Refill   apixaban  (ELIQUIS ) 5 MG TABS tablet Take 1 tablet (5 mg total) by mouth 2 (two) times daily. 180 tablet 3   ascorbic acid (VITAMIN C) 500 MG tablet Take 500 mg by mouth daily in the afternoon.     Calcium  Carb-Cholecalciferol (CALCIUM -VITAMIN D ) 600-400 MG-UNIT TABS Take 1 tablet by mouth daily. 60 tablet    cetirizine  (ZYRTEC ) 10 MG tablet Take 1 tablet (10 mg total) by mouth daily.     citalopram  (CELEXA ) 40 MG tablet Take 1 tablet (40 mg total) by mouth daily.     fluticasone -salmeterol (ADVAIR HFA) 45-21 MCG/ACT inhaler Inhale 2 puffs into the lungs at bedtime as needed (asthma symptoms).     furosemide  (LASIX ) 20 MG tablet Take 1 tablet (20 mg total) by mouth daily. 90 tablet 3   ipratropium (ATROVENT) 0.06 % nasal spray Place 2 sprays into both nostrils 3 (three) times daily as needed for rhinitis.     levothyroxine  (SYNTHROID , LEVOTHROID) 75 MCG tablet Take 75 mcg by mouth daily before breakfast.     montelukast  (SINGULAIR ) 10 MG tablet Take 1 tablet (10 mg total) by mouth at bedtime.     Multiple Vitamin (MULTIVITAMIN WITH MINERALS) TABS tablet Take 1 tablet by mouth daily.     spironolactone  (ALDACTONE ) 25 MG tablet Take 1 tablet (25 mg total) by mouth daily. 90 tablet 3   No current facility-administered medications for this visit.    Allergies:   Paroxetine hcl   Social History:  The patient  reports that she has never smoked. She has never been exposed to tobacco smoke. She has never used smokeless tobacco. She reports current alcohol use of about 1.0 standard drink of alcohol per week. She reports that she does not use drugs.   Family History:   family history includes Breast cancer in her sister; Hypertension in her mother; Osteoporosis in  her sister; Stroke in her father and mother.    Review of Systems: Review of Systems  Constitutional: Negative.   HENT: Negative.    Respiratory: Negative.    Cardiovascular: Negative.   Gastrointestinal: Negative.   Musculoskeletal: Negative.        Gait instability  Neurological: Negative.   Psychiatric/Behavioral: Negative.    All other systems reviewed and are negative.  PHYSICAL EXAM: VS:  BP 130/81 (BP Location: Left Arm, Patient Position: Sitting, Cuff Size: Normal)   Pulse 68   Resp 19   Ht 5' (1.524 m)   Wt 134 lb (60.8 kg)   BMI 26.17 kg/m  , BMI Body mass index is 26.17 kg/m.  Constitutional:  oriented to person, place, and time. No distress.  HENT:  Head: Grossly normal Eyes:  no discharge. No scleral icterus.  Neck: No JVD, no carotid bruits  Cardiovascular: Regular rate and rhythm, no murmurs appreciated Pulmonary/Chest: Clear to auscultation bilaterally, no wheezes or rales Abdominal: Soft.  no distension.  no tenderness.  Musculoskeletal: Normal range of motion Neurological:  normal muscle tone. Coordination normal. No atrophy Skin: Skin warm and dry Psychiatric: normal affect, pleasant  Recent Labs: 08/11/2023: ALT 12; Hemoglobin 11.8; Platelets  295 10/20/2023: BUN 19; Creatinine, Ser 0.87; Potassium 4.6; Sodium 143    Lipid Panel No results found for: CHOL, HDL, LDLCALC, TRIG    Wt Readings from Last 3 Encounters:  05/15/24 134 lb (60.8 kg)  03/01/24 137 lb (62.1 kg)  12/28/23 138 lb (62.6 kg)     ASSESSMENT AND PLAN:  Persistent atrial fibrillation History of sick sinus syndrome Persistent atrial fib, now maintaining normal sinus rhythm Off atenolol  12.5  on Eliquis  5 BID Taking Lasix  and spironolactone  daily . Leg swelling minimal leg swelling on today's visit Recommend she continue current dose Lasix  20 daily with spironolactone  25 daily  Hernia Prior hospitalization, medically managed  Anxiety Managed by primary  care stable  Hypothyroidism, unspecified type On thyroid supplement, stable  Hyperlipidemia Cholesterol 227 Previously declined cholesterol medication  Uncomplicated asthma, unspecified asthma severity, unspecified whether persistent Followed by pulmonary, no recent exacerbations  Gait instability/risk of falls Prior history of falls recommended regular walking program  Grief/adjustment disorder Long discussion concerning loss of her husband  Orders Placed This Encounter  Procedures   EKG 12-Lead     Signed, Velinda Lunger, M.D., Ph.D. 05/15/2024  [facility] [place], [place] [phone]

## 2024-05-15 ENCOUNTER — Encounter: Payer: Self-pay | Admitting: Cardiovascular Disease

## 2024-05-15 ENCOUNTER — Ambulatory Visit: Admitting: Cardiovascular Disease

## 2024-05-15 VITALS — BP 130/81 | HR 68 | Resp 19 | Ht 60.0 in | Wt 134.0 lb

## 2024-05-15 DIAGNOSIS — I5022 Chronic systolic (congestive) heart failure: Secondary | ICD-10-CM | POA: Diagnosis present

## 2024-05-15 DIAGNOSIS — I700 Atherosclerosis of aorta: Secondary | ICD-10-CM | POA: Diagnosis present

## 2024-05-15 DIAGNOSIS — R001 Bradycardia, unspecified: Secondary | ICD-10-CM | POA: Diagnosis present

## 2024-05-15 DIAGNOSIS — I428 Other cardiomyopathies: Secondary | ICD-10-CM | POA: Insufficient documentation

## 2024-05-15 DIAGNOSIS — I4819 Other persistent atrial fibrillation: Secondary | ICD-10-CM | POA: Diagnosis not present

## 2024-05-15 DIAGNOSIS — E782 Mixed hyperlipidemia: Secondary | ICD-10-CM | POA: Diagnosis present

## 2024-05-15 DIAGNOSIS — I10 Essential (primary) hypertension: Secondary | ICD-10-CM | POA: Insufficient documentation

## 2024-05-15 DIAGNOSIS — I5032 Chronic diastolic (congestive) heart failure: Secondary | ICD-10-CM | POA: Diagnosis not present

## 2024-05-15 DIAGNOSIS — I272 Pulmonary hypertension, unspecified: Secondary | ICD-10-CM | POA: Insufficient documentation

## 2024-05-15 NOTE — Patient Instructions (Addendum)
 Medication Instructions:  ?No changes ? ?If you need a refill on your cardiac medications before your next appointment, please call your pharmacy.  ? ?Lab work: ?No new labs needed ? ?Testing/Procedures: ?No new testing needed ? ?Follow-Up: ?At [facility], you and your health needs are our priority.  As part of our continuing mission to provide you with exceptional heart care, we have created designated Provider Care Teams.  These Care Teams include your primary Cardiologist (physician) and Advanced Practice Providers (APPs -  Physician Assistants and Nurse Practitioners) who all work together to provide you with the care you need, when you need it. ? ?You will need a follow up appointment in 6 months, APP ok ? ?Providers on your designated Care Team:   ?Nicolasa Ducking, NP ?Eula Listen, PA-C ?Cadence Fransico Michael, PA-C ? ?COVID-19 Vaccine Information can be found at: [email or web address] For questions related to vaccine distribution or appointments, please email [email or web address] or call [phone].  ? ?

## 2024-05-17 ENCOUNTER — Encounter: Payer: Self-pay | Admitting: Podiatry

## 2024-05-17 NOTE — Progress Notes (Signed)
  Subjective:  Patient ID: Amanda Dalton, female    DOB: 18-Dec-1930,  MRN: [id number]  Amanda Dalton presents to clinic today for: painful, elongated thickened toenails x 10 which are symptomatic when wearing enclosed shoe gear. This interferes with his/her daily activities.   PCP is Lyle San, MD. Amanda Dalton 03/15/2024.  Allergies  Allergen Reactions   Paroxetine Hcl Nausea Only and Nausea And Vomiting    Review of Systems: Negative except as noted in the HPI.  Objective: No changes noted in today's physical examination. There were no vitals filed for this visit.  Amanda Dalton is a pleasant 89 y.o. female WD, WN in NAD. AAO x 3.  Vascular Examination: Capillary refill time <3 seconds b/l LE. Palpable pedal pulses b/l LE. Digital hair present b/l. No pedal edema b/l. Skin temperature gradient WNL b/l. No varicosities b/l. No ischemia or gangrene noted b/l LE. No cyanosis or clubbing noted b/l LE.Aaron Aas  Dermatological Examination: Pedal skin with normal turgor, texture and tone b/l. No open wounds. No interdigital macerations b/l. Toenails 1-5 b/l thickened, discolored, dystrophic with subungual debris. There is pain on palpation to dorsal aspect of nailplates. No hyperkeratotic nor porokeratotic lesions present on today's visit.Aaron Aas  Neurological Examination: Protective sensation intact with 10 gram monofilament b/l LE. Vibratory sensation intact b/l LE.   Musculoskeletal Examination: Muscle strength 5/5 to all lower extremity muscle groups bilaterally. Hammertoe(s) 2-5 bilaterally.  Assessment/Plan: 1. Pain due to onychomycosis of toenails of both feet     Patient was evaluated and treated. All patient's and/or POA's questions/concerns addressed on today's visit. Toenails 1-5 debrided in length and girth without incident. Continue soft, supportive shoe gear daily. Report any pedal injuries to medical professional. Call office if there are any questions/concerns. -Patient/POA to call should  there be question/concern in the interim.   No follow-ups on file.  Amanda Dalton, DPM      [place] LOCATION: [address] [place] [address]                   Office [phone]   [facility] LOCATION: [address] [place], [place] [address] Office [phone]

## 2024-07-13 ENCOUNTER — Telehealth: Payer: Self-pay | Admitting: Cardiovascular Disease

## 2024-07-13 NOTE — Telephone Encounter (Signed)
 Spoke with patient and she reviewed her ankles are swelling and they had not done that before. Instructed her to try compression hose/socks and keeping them elevated. She said she is not able to wear compression socks because of how bad they are swollen. Inquired about daily weights and she does not weigh. Advised to weigh daily so we can see if she has weight gain. She reports drinking a lot water and advised that can also cause swelling and to try and reduce that intake so we can see if that helps as well. Scheduled her to come in and see Bernardino Bring PA-C and instructed to call back if her symptoms should worsen. She verbalized understanding of our conversation with no further questions at this time.

## 2024-07-13 NOTE — Telephone Encounter (Signed)
 Pt c/o swelling/edema: STAT if pt has developed SOB within 24 hours  If swelling, where is the swelling located?   Ankles  How much weight have you gained and in what time span?  Not weighed recently  Have you gained 2 pounds in a day or 5 pounds in a week? No  Do you have a log of your daily weights (if so, list)?   No  Are you currently taking a fluid pill?   Yes  Are you currently SOB?   No  Have you traveled recently in a car or plane for an extended period of time?  No  Patient stated she has been drinking a lot of water and has been prescribed furosemide  (LASIX ) 20 MG tablet  and spironolactone  (ALDACTONE ) 25 MG tablet and wants advice on next steps.

## 2024-08-09 NOTE — Progress Notes (Unsigned)
 Cardiology Office Note    Date:  08/10/2024   ID:  Amanda Dalton, DOB Oct 18, 1931, MRN [id number]  PCP:  Valora Agent, MD  Cardiologist:  Evalene Lunger, MD  Electrophysiologist:  Elspeth Sage, MD   Chief Complaint: Follow-up  History of Present Illness:   Amanda Dalton is a 89 y.o. female with history of HFimpEF, persistent A-fib/flutter on apixaban , pulmonary hypertension, breast cancer status post right-sided modified radical mastectomy and chemotherapy in 1996, endometrial cancer status post hysterectomy in 2002, aortic atherosclerosis, HTN, HLD, hypothyroidism, asthma, and hiatal/ventral hernia with recurrent SBO who presents for follow-up of cardiomyopathy and A-fib.  She was diagnosed with atrial flutter in 10/2016 and underwent DCCV after adequate anticoagulation.  Echo in 11/2016 showed an EF of 50 to 55%, no regional wall motion abnormalities, mild focal basal hypertrophy of the septum, grade 2 diastolic dysfunction, mild mitral regurgitation, mildly dilated left atrium, normal RV systolic function and ventricular cavity size, and moderately elevated RVSP estimated at 50 to 55 mmHg.  She was later diagnosed with A-fib in 2019, initially with plans for DCCV, though pharmacologically cardioverted on atenolol  and amiodarone  prior to procedure.  Zio patch in 06/2018 showed a predominant rhythm of sinus with an average rate of 57 bpm.  There was 27% A-fib burden with an average ventricular rate of 74 bpm with the longest episode lasting 2 days and 8 hours, 8 episodes of SVT lasting up to 6 beats, occasional PACs, and rare atrial couplets, triplets PVCs, and ventricular couplets.  Given issues with bradycardia while in sinus rhythm, she was referred to EP with recommendation to discontinue amiodarone  and atenolol  with watchful waiting.  Due to recurrent arrhythmia she underwent DCCV in 12/2019 with recurrence of A-fib, and in follow-up in 01/2020 there was subsequent spontaneous conversion to sinus  rhythm.  She subsequently redeveloped A-fib/flutter and has been rate controlled since 2022.   She was admitted to the hospital in 07/2023 with a recurrent SBO with chronic ventral hernia that was conservatively managed.  Hemoccult positive with an admission hemoglobin of 11.8 (consistent with prior readings).  At time of discharge, her furosemide  and hydralazine  were discontinued.   She contacted our office on 08/17/2023 with elevated BP readings in the 160s to 170s systolic and lower extremity swelling.  Historically, she was to take furosemide  or hydralazine  if systolic blood pressure was greater than 160.  She took spironolactone  with some improvement in leg swelling.  She was seen in follow-up on 08/19/2023 noting some improvement in bilateral ankle edema following spironolactone .  She had been snacking on salty foods.  Echo on 09/15/2023 showed an EF of 45 to 50%, global hypokinesis, normal RV systolic function and ventricular cavity size, severely dilated left atrium, moderate mitral regurgitation, mild to moderate tricuspid regurgitation, aortic valve sclerosis without evidence of stenosis, and an estimated right atrial pressure of 3 mmHg.  With mildly reduced EF, it was recommended she stop atenolol  and start carvedilol  with continuation of spironolactone .  In follow-up she preferred to continue current medical therapy and pursue a limited echo in several months to reevaluate LV systolic function.  Limited echo on 11/24/2023 showed an EF of 55 to 60%, no regional wall motion abnormalities, normal RV systolic function and ventricular cavity size, elevated RVSP estimated at 72.5 mmHg (improved from 77.9 in 09/2023), moderately dilated left atrium, moderate mitral regurgitation, and moderate to severe tricuspid regurgitation.  Carvedilol  has previously been discontinued in the setting of fatigue and bradycardia in the 40s  bpm with subsequent improvement in energy and heart rates.  She was seen by Dr. Gollan  in 04/2024 and remained off beta-blocker.  She was maintaining sinus rhythm with PACs.  She was continued on furosemide  20 mg daily and spironolactone  25 mg.  She called our office in 06/2024 reporting that her ankles were swelling and appointment was scheduled for today.  She comes in today and is doing well from a cardiac perspective and is without symptoms of angina or cardiac decompensation.  No dyspnea, dizziness, presyncope, or syncope.  She notes her ankle swelling has improved somewhat.  Not currently wearing compression socks or elevating legs.  No orthopnea or abdominal distention.  No falls or symptoms concerning for bleeding.   Labs independently reviewed: 02/2024 - Hgb 12.5, PLT 269, potassium 4.6, BUN 21, serum creatinine 1, albumin 4.4, AST/ALT normal, TSH normal, TC 227, TG 105, HDL 77, LDL 128  Past Medical History:  Diagnosis Date   Anxiety    Asthma    Atrial flutter (HCC)    a. Dx 11/17/2016-->CHA2DS2VASc = 3-->Eliquis  5 mg BID.   Breast cancer (HCC)    a. 1996 s/p R modified radical mastectomy-->chemo w/ tamoxifen.   Endometrial cancer (HCC)    a. 08/2001 s/p hysterectomy. No node involvement.   Fracture of right humerus 11/2008   Hypothyroidism    Osteoporosis    Squamous cell carcinoma of skin 01/29/2010   Left superior side. KA, WD SCC not entirely excluded.    Past Surgical History:  Procedure Laterality Date   CARDIOVERSION N/A 04/19/2018   Procedure: CARDIOVERSION;  Surgeon: Perla Evalene PARAS, MD;  Location: [facility] ORS;  Service: Cardiovascular;  Laterality: N/A;   CARDIOVERSION N/A 01/18/2020   Procedure: CARDIOVERSION;  Surgeon: Perla Evalene PARAS, MD;  Location: [facility] ORS;  Service: Cardiovascular;  Laterality: N/A;   Right Mastectomy  1996   VAGINAL HYSTERECTOMY  08/2001    Current Medications: Current Meds  Medication Sig   apixaban  (ELIQUIS ) 5 MG TABS tablet Take 1 tablet (5 mg total) by mouth 2 (two) times daily.   ascorbic acid (VITAMIN C) 500 MG  tablet Take 500 mg by mouth daily in the afternoon.   Calcium  Carb-Cholecalciferol (CALCIUM -VITAMIN D ) 600-400 MG-UNIT TABS Take 1 tablet by mouth daily.   citalopram  (CELEXA ) 40 MG tablet Take 1 tablet (40 mg total) by mouth daily.   fluticasone -salmeterol (ADVAIR HFA) 45-21 MCG/ACT inhaler Inhale 2 puffs into the lungs at bedtime as needed (asthma symptoms).   ipratropium (ATROVENT) 0.06 % nasal spray Place 2 sprays into both nostrils 3 (three) times daily as needed for rhinitis.   levothyroxine  (SYNTHROID , LEVOTHROID) 75 MCG tablet Take 75 mcg by mouth daily before breakfast.   montelukast  (SINGULAIR ) 10 MG tablet Take 1 tablet (10 mg total) by mouth at bedtime.   Multiple Vitamin (MULTIVITAMIN WITH MINERALS) TABS tablet Take 1 tablet by mouth daily.   spironolactone  (ALDACTONE ) 25 MG tablet Take 1 tablet (25 mg total) by mouth daily.   [DISCONTINUED] furosemide  (LASIX ) 20 MG tablet Take 1 tablet (20 mg total) by mouth daily.    Allergies:   Paroxetine hcl   Social History   Socioeconomic History   Marital status: Widowed    Spouse name: Not on file   Number of children: 0   Years of education: Not on file   Highest education level: Not on file  Occupational History   Occupation: Retired  Tobacco Use   Smoking status: Never    Passive exposure: Never  Smokeless tobacco: Never  Vaping Use   Vaping status: Never Used  Substance and Sexual Activity   Alcohol use: Yes    Alcohol/week: 1.0 standard drink of alcohol    Types: 1 Glasses of wine per week    Comment: per day   Drug use: No   Sexual activity: Never  Other Topics Concern   Not on file  Social History Narrative   Lives locally with husband - Metallurgist   Social Drivers of Health   Financial Resource Strain: Low Risk  (07/04/2024)   Received from [facility]   Overall Financial Resource Strain (CARDIA)    Difficulty of Paying Living Expenses: Not hard at all  Food Insecurity: No Food Insecurity  (07/04/2024)   Received from [facility]   Hunger Vital Sign    Within the past 12 months, you worried that your food would run out before you got the money to buy more.: Never true    Within the past 12 months, the food you bought just didn't last and you didn't have money to get more.: Never true  Transportation Needs: No Transportation Needs (07/04/2024)   Received from [facility] - Transportation    In the past 12 months, has lack of transportation kept you from medical appointments or from getting medications?: No    Lack of Transportation (Non-Medical): No  Physical Activity: Not on file  Stress: Not on file  Social Connections: Not on file     Family History:  The patient's family history includes Breast cancer in her sister; Hypertension in her mother; Osteoporosis in her sister; Stroke in her father and mother.  ROS:   12-point review of systems is negative unless otherwise noted in the HPI.   EKGs/Labs/Other Studies Reviewed:    Studies reviewed were summarized above. The additional studies were reviewed today:  Limited echo 11/24/2023: 1. Left ventricular ejection fraction, by estimation, is 55 to 60%. Left  ventricular ejection fraction by 3D volume is 59 %. The left ventricle has  normal function. The left ventricle has no regional wall motion  abnormalities. The average left  ventricular global longitudinal strain is -20.3 %.   2. Right ventricular systolic function is normal. The right ventricular  size is normal. There is severely elevated pulmonary artery systolic  pressure. The estimated right ventricular systolic pressure is 72.5 mmHg.   3. Left atrial size was moderately dilated.   4. The mitral valve is normal in structure. Moderate mitral valve  regurgitation. No evidence of mitral stenosis.   5. Tricuspid valve regurgitation is moderate to severe.   6. The aortic valve is normal in structure. Aortic valve  regurgitation is  not visualized. No aortic stenosis is present.   7. The inferior vena cava is normal in size with greater than 50%  respiratory variability, suggesting right atrial pressure of 3 mmHg.  __________   Limited echo 10/11/2023: 1. Left ventricular ejection fraction, by estimation, is 55 to 60%. The  left ventricle has normal function. The left ventricle has no regional  wall motion abnormalities. There is mild left ventricular hypertrophy.   2. Right ventricular systolic function is normal. The right ventricular  size is normal. There is severely elevated pulmonary artery systolic  pressure. The estimated right ventricular systolic pressure is 77.9 mmHg.   3. The mitral valve is normal in structure. Moderate to severe mitral  valve regurgitation. No evidence of mitral stenosis.  4. Tricuspid valve regurgitation is moderate to severe.   5. The aortic valve has an indeterminant number of cusps. Aortic valve  regurgitation is not visualized. No aortic stenosis is present.   6. The inferior vena cava is dilated in size with >50% respiratory  variability, suggesting right atrial pressure of 8 mmHg.  __________   2D echo 09/15/2023: 1. Left ventricular ejection fraction, by estimation, is 45 to 50%. Left  ventricular ejection fraction by PLAX is 51 %. The left ventricle has  mildly decreased function. The left ventricle demonstrates global  hypokinesis. Left ventricular diastolic  parameters are indeterminate. The average left ventricular global  longitudinal strain is -13.9 %.   2. Right ventricular systolic function is normal. The right ventricular  size is normal.   3. Left atrial size was severely dilated.   4. The mitral valve is normal in structure. Moderate mitral valve  regurgitation. No evidence of mitral stenosis.   5. Tricuspid valve regurgitation is mild to moderate.   6. The aortic valve is normal in structure. Aortic valve regurgitation is  not visualized.  Aortic valve sclerosis is present, with no evidence of  aortic valve stenosis.   7. The inferior vena cava is normal in size with greater than 50%  respiratory variability, suggesting right atrial pressure of 3 mmHg.   8. Rhythm concerning for atrial flutter  __________   Zio patch 06/2018: Normal sinus rhythm max HR of 118 bpm, and avg HR of 57 bpm.   Paroxysmal atrial fibrillation Atrial Fibrillation occurred (27% burden), ranging from 47- 118 bpm (avg of 74 bpm), the longest lasting 2 days 8 hours with an avg rate of 72 bpm.    8 Supraventricular Tachycardia/atrial tachycardia runs occurred, the run with the fastest interval lasting 4 beats with a max rate of 109 bpm, the longest lasting 6 beats with an avg rate of 92 bpm. Isolated SVEs were occasional (3.6%, 40182), SVE Couplets were rare (<1.0%, 233), and SVE Triplets were rare (<1.0%, 8). Isolated VEs were rare (<1.0%), VE Couplets were rare (<1.0%), and no VE Triplets were present. Ventricular Bigeminy and Trigeminy were present. __________   2D echo 12/08/2016: - Left ventricle: The cavity size was normal. There was mild focal    basal hypertrophy of the septum. Systolic function was normal.    The estimated ejection fraction was in the range of 50% to 55%.    Wall motion was normal; there were no regional wall motion    abnormalities. Features are consistent with a pseudonormal left    ventricular filling pattern, with concomitant abnormal relaxation    and increased filling pressure (grade 2 diastolic dysfunction).  - Aortic valve: Mildly thickened leaflets. Transvalvular velocity    was within the normal range. There was no stenosis. There was no    regurgitation.  - Mitral valve: Mildly thickened leaflets . There was mild    regurgitation.  - Left atrium: The atrium was mildly dilated.  - Right ventricle: The cavity size was normal. Systolic function    was normal.  - Tricuspid valve: Structurally normal valve. There was  mild    regurgitation.  - Pulmonary arteries: Systolic pressure was moderately increased,    in the range of 50 mm Hg to 55 mm Hg.  - Inferior vena cava: The vessel was dilated. The respirophasic    diameter changes were blunted (< 50%), consistent with elevated    central venous pressure.    EKG:  EKG is ordered  today.  The EKG ordered today demonstrates Afib, 85 bpm, low voltage QRS, no acute ST/T changes  Recent Labs: 10/20/2023: BUN 19; Creatinine, Ser 0.87; Potassium 4.6; Sodium 143  Recent Lipid Panel No results found for: CHOL, TRIG, HDL, CHOLHDL, VLDL, LDLCALC, LDLDIRECT  PHYSICAL EXAM:    VS:  BP 115/78 (BP Location: Left Arm, Patient Position: Sitting, Cuff Size: Normal)   Pulse 85   Ht 5' (1.524 m)   Wt 130 lb 12.8 oz (59.3 kg)   SpO2 100%   BMI 25.55 kg/m   BMI: Body mass index is 25.55 kg/m.  Physical Exam Vitals reviewed.  Constitutional:      Appearance: She is well-developed.  HENT:     Head: Normocephalic and atraumatic.  Eyes:     General:        Right eye: No discharge.        Left eye: No discharge.  Neck:     Vascular: No JVD.  Cardiovascular:     Rate and Rhythm: Normal rate. Rhythm irregularly irregular.     Heart sounds: Normal heart sounds, S1 normal and S2 normal. Heart sounds not distant. No midsystolic click and no opening snap. No murmur heard.    No friction rub.  Pulmonary:     Effort: Pulmonary effort is normal. No respiratory distress.     Breath sounds: Normal breath sounds. No decreased breath sounds, wheezing, rhonchi or rales.  Abdominal:     General: There is no distension.     Palpations: Abdomen is soft.     Tenderness: There is no abdominal tenderness.  Musculoskeletal:     Cervical back: Normal range of motion.     Right lower leg: No edema.     Left lower leg: No edema.     Comments: Bilateral ankle varicosities noted. No significant lower extremity edema.   Skin:    General: Skin is warm and dry.      Nails: There is no clubbing.  Neurological:     Mental Status: She is alert and oriented to person, place, and time.  Psychiatric:        Speech: Speech normal.        Behavior: Behavior normal.        Thought Content: Thought content normal.        Judgment: Judgment normal.     Wt Readings from Last 3 Encounters:  08/10/24 130 lb 12.8 oz (59.3 kg)  05/15/24 134 lb (60.8 kg)  03/01/24 137 lb (62.1 kg)     ASSESSMENT & PLAN:   HFimpEF: Euvolemic and well compensated.  She remains on spironolactone  25 mg and furosemide  20 mg.  She does note some mild intermittent ankle swelling that is improved at this time.  She may take an additional furosemide  20 mg as needed for increased lower extremity swelling with recommendation to use very sparingly.  Not currently on ACE inhibitor/ARB/ARNI or SGLT2 inhibitor given normalization of LV systolic function.  Pulmonary hypertension: Asymptomatic.  Remains on spironolactone  and furosemide  as above.  Persistent A-fib/flutter: Asymptomatic and rate controlled not requiring AV nodal blocking medication given prior bradycardia and fatigue.  CHA2DS2-VASc at least 6 (CHF, HTN, age x 2, vascular disease, sex category).  Remains on apixaban  5 mg twice daily with borderline weight for dosage adjustment.  Baseline weight is typically greater than 60 kg.  Continue to monitor.  If in follow-up her weight remains less than 60 kg may need to consider transitioning to lower dose apixaban   or transitioning to rivaroxaban which has more stable dosing.  HTN: Blood pressure is well-controlled in the office today.  Continue pharmacotherapy as above.  Aortic atherosclerosis/HLD: LDL 128 in 02/2024.  Has historically declined statin.  Bradycardia: Resolved following discontinuation of beta-blocker.  No indication for PPM at this time.     Disposition: F/u with Dr. Gollan or an APP in 6 months, and EP as directed.    Medication Adjustments/Labs and Tests  Ordered: Current medicines are reviewed at length with the patient today.  Concerns regarding medicines are outlined above. Medication changes, Labs and Tests ordered today are summarized above and listed in the Patient Instructions accessible in Encounters.   Signed, Bernardino Bring, PA-C 08/10/2024 1:23 PM     [facility] [place] [address] [place], [place] [address] [phone]

## 2024-08-10 ENCOUNTER — Ambulatory Visit: Attending: Physician Assistant | Admitting: Physician Assistant

## 2024-08-10 ENCOUNTER — Encounter: Payer: Self-pay | Admitting: Physician Assistant

## 2024-08-10 VITALS — BP 115/78 | HR 85 | Ht 60.0 in | Wt 130.8 lb

## 2024-08-10 DIAGNOSIS — I272 Pulmonary hypertension, unspecified: Secondary | ICD-10-CM | POA: Diagnosis present

## 2024-08-10 DIAGNOSIS — I10 Essential (primary) hypertension: Secondary | ICD-10-CM | POA: Diagnosis present

## 2024-08-10 DIAGNOSIS — E782 Mixed hyperlipidemia: Secondary | ICD-10-CM | POA: Diagnosis present

## 2024-08-10 DIAGNOSIS — I700 Atherosclerosis of aorta: Secondary | ICD-10-CM | POA: Diagnosis present

## 2024-08-10 DIAGNOSIS — R001 Bradycardia, unspecified: Secondary | ICD-10-CM | POA: Diagnosis present

## 2024-08-10 DIAGNOSIS — I5032 Chronic diastolic (congestive) heart failure: Secondary | ICD-10-CM | POA: Diagnosis present

## 2024-08-10 DIAGNOSIS — I4819 Other persistent atrial fibrillation: Secondary | ICD-10-CM | POA: Diagnosis present

## 2024-08-10 MED ORDER — FUROSEMIDE 20 MG PO TABS: 20.0000 mg | ORAL_TABLET | Freq: Every day | ORAL | 3 refills | Status: AC

## 2024-08-10 NOTE — Patient Instructions (Addendum)
 Medication Instructions:  Your physician recommends the following medication changes.  INCREASE: Lasix  20 mg tab additional dose once daily as needed (AND PLEASE USE SPARINGLY) for swelling in the legs  *If you need a refill on your cardiac medications before your next appointment, please call your pharmacy*  Lab Work: None ordered at this time   Follow-Up: At [facility], you and your health needs are our priority.  As part of our continuing mission to provide you with exceptional heart care, our providers are all part of one team.  This team includes your primary Cardiologist (physician) and Advanced Practice Providers or APPs (Physician Assistants and Nurse Practitioners) who all work together to provide you with the care you need, when you need it.  Your next appointment:   6 month(s)  Provider:   You may see Timothy Gollan, MD or Bernardino Bring, PA-C

## 2024-08-16 ENCOUNTER — Ambulatory Visit: Admitting: Podiatry

## 2024-09-06 ENCOUNTER — Encounter: Payer: Self-pay | Admitting: Podiatry

## 2024-09-06 ENCOUNTER — Ambulatory Visit: Admitting: Podiatry

## 2024-09-06 DIAGNOSIS — B351 Tinea unguium: Secondary | ICD-10-CM | POA: Diagnosis not present

## 2024-09-06 DIAGNOSIS — M79674 Pain in right toe(s): Secondary | ICD-10-CM

## 2024-09-06 DIAGNOSIS — M79675 Pain in left toe(s): Secondary | ICD-10-CM

## 2024-09-14 NOTE — Progress Notes (Signed)
  Subjective:  Patient ID: Amanda Dalton, female    DOB: February 16, 1931,  MRN: [id number]  89 y.o. female presents to clinic with  painful elongated mycotic toenails 1-5 bilaterally which are tender when wearing enclosed shoe gear. Pain is relieved with periodic professional debridement.  Chief Complaint  Patient presents with   Toe Pain    RFC. Dr. Stanton is her PCP. Last visit was in April   New problem(s): None   PCP is Amanda Lynwood FALCON, MD.  Allergies  Allergen Reactions   Paroxetine Hcl Nausea Only and Nausea And Vomiting   Review of Systems: Negative except as noted in the HPI.   Objective:  Amanda Dalton is a pleasant 89 y.o. female WD, WN in NAD. AAO x 3.  Vascular Examination: Vascular status intact b/l with palpable pedal pulses. Pedal hair sparse. CFT immediate b/l. No edema. No pain with calf compression b/l. Skin temperature gradient WNL b/l.   Neurological Examination: Sensation grossly intact b/l with 10 gram monofilament. Vibratory sensation intact b/l.   Dermatological Examination: Pedal skin with normal turgor, texture and tone b/l. Toenails 1-5 b/l thick, discolored, elongated with subungual debris and pain on dorsal palpation. No hyperkeratotic lesions noted b/l.   Musculoskeletal Examination: Muscle strength 5/5 to b/l LE. Hammertoe(s) 2-5 b/l. Utilizes cane for ambulation assistance.  Radiographs: None Assessment:   1. Pain due to onychomycosis of toenails of both feet    Plan:  Consent given for treatment. Patient examined. All patient's and/or POA's questions/concerns addressed on today's visit. Mycotic toenails 1-5 debrided in length and girth without incident. Continue soft, supportive shoe gear daily. Report any pedal injuries to medical professional. Call office if there are any quesitons/concerns. -Patient/POA to call should there be question/concern in the interim.  Return in about 3 months (around 12/07/2024).  Delon LITTIE Merlin, DPM       [place] LOCATION: [address] [place] [address]                   Office [phone]   [facility] LOCATION: [address] [place], [place] [address] Office [phone]

## 2024-11-14 ENCOUNTER — Ambulatory Visit: Admitting: Dermatology

## 2024-11-14 DIAGNOSIS — L814 Other melanin hyperpigmentation: Secondary | ICD-10-CM

## 2024-11-14 DIAGNOSIS — L578 Other skin changes due to chronic exposure to nonionizing radiation: Secondary | ICD-10-CM

## 2024-11-14 DIAGNOSIS — L821 Other seborrheic keratosis: Secondary | ICD-10-CM | POA: Diagnosis not present

## 2024-11-14 DIAGNOSIS — Z8589 Personal history of malignant neoplasm of other organs and systems: Secondary | ICD-10-CM

## 2024-11-14 DIAGNOSIS — D1801 Hemangioma of skin and subcutaneous tissue: Secondary | ICD-10-CM

## 2024-11-14 DIAGNOSIS — Z85828 Personal history of other malignant neoplasm of skin: Secondary | ICD-10-CM

## 2024-11-14 DIAGNOSIS — Z1283 Encounter for screening for malignant neoplasm of skin: Secondary | ICD-10-CM

## 2024-11-14 DIAGNOSIS — W908XXA Exposure to other nonionizing radiation, initial encounter: Secondary | ICD-10-CM | POA: Diagnosis not present

## 2024-11-14 DIAGNOSIS — L820 Inflamed seborrheic keratosis: Secondary | ICD-10-CM

## 2024-11-14 DIAGNOSIS — D229 Melanocytic nevi, unspecified: Secondary | ICD-10-CM

## 2024-11-14 DIAGNOSIS — I872 Venous insufficiency (chronic) (peripheral): Secondary | ICD-10-CM

## 2024-11-14 DIAGNOSIS — Z7189 Other specified counseling: Secondary | ICD-10-CM

## 2024-11-14 NOTE — Patient Instructions (Addendum)
 Seborrheic Keratosis  What causes seborrheic keratoses? Seborrheic keratoses are harmless, common skin growths that first appear during adult life.  As time goes by, more growths appear.  Some people may develop a large number of them.  Seborrheic keratoses appear on both covered and uncovered body parts.  They are not caused by sunlight.  The tendency to develop seborrheic keratoses can be inherited.  They vary in color from skin-colored to gray, brown, or even black.  They can be either smooth or have a rough, warty surface.   Seborrheic keratoses are superficial and look as if they were stuck on the skin.  Under the microscope this type of keratosis looks like layers upon layers of skin.  That is why at times the top layer may seem to fall off, but the rest of the growth remains and re-grows.    Treatment Seborrheic keratoses do not need to be treated, but can easily be removed in the office.  Seborrheic keratoses often cause symptoms when they rub on clothing or jewelry.  Lesions can be in the way of shaving.  If they become inflamed, they can cause itching, soreness, or burning.  Removal of a seborrheic keratosis can be accomplished by freezing, burning, or surgery. If any spot bleeds, scabs, or grows rapidly, please return to have it checked, as these can be an indication of a skin cancer.   Cryotherapy Aftercare  Wash gently with soap and water everyday.   Apply Vaseline and Band-Aid daily until healed.     Gentle Skin Care Guide  1. Bathe no more than once a day.  2. Avoid bathing in hot water  3. Use a mild soap like Dove, Vanicream, Cetaphil, CeraVe. Can use Lever 2000 or Cetaphil antibacterial soap  4. Use soap only where you need it. On most days, use it under your arms, between your legs, and on your feet. Let the water rinse other areas unless visibly dirty.  5. When you get out of the bath/shower, use a towel to gently blot your skin dry, don't rub it.  6. While your  skin is still a little damp, apply a moisturizing cream such as Vanicream, CeraVe, Cetaphil, Eucerin, Sarna lotion or plain Vaseline Jelly. For hands apply Neutrogena Norwegian Hand Cream or Excipial Hand Cream.  7. Reapply moisturizer any time you start to itch or feel dry.  8. Sometimes using free and clear laundry detergents can be helpful. Fabric softener sheets should be avoided. Downy Free & Gentle liquid, or any liquid fabric softener that is free of dyes and perfumes, it acceptable to use  9. If your doctor has given you prescription creams you may apply moisturizers over them      Melanoma ABCDEs  Melanoma is the most dangerous type of skin cancer, and is the leading cause of death from skin disease.  You are more likely to develop melanoma if you: Have light-colored skin, light-colored eyes, or red or blond hair Spend a lot of time in the sun Tan regularly, either outdoors or in a tanning bed Have had blistering sunburns, especially during childhood Have a close family member who has had a melanoma Have atypical moles or large birthmarks  Early detection of melanoma is key since treatment is typically straightforward and cure rates are extremely high if we catch it early.   The first sign of melanoma is often a change in a mole or a new dark spot.  The ABCDE system is a way of remembering the  signs of melanoma.  A for asymmetry:  The two halves do not match. B for border:  The edges of the growth are irregular. C for color:  A mixture of colors are present instead of an even brown color. D for diameter:  Melanomas are usually (but not always) greater than 6mm - the size of a pencil eraser. E for evolution:  The spot keeps changing in size, shape, and color.  Please check your skin once per month between visits. You can use a small mirror in front and a large mirror behind you to keep an eye on the back side or your body.   If you see any new or changing lesions before your  next follow-up, please call to schedule a visit.  Please continue daily skin protection including broad spectrum sunscreen SPF 30+ to sun-exposed areas, reapplying every 2 hours as needed when you're outdoors.   Staying in the shade or wearing long sleeves, sun glasses (UVA+UVB protection) and wide brim hats (4-inch brim around the entire circumference of the hat) are also recommended for sun protection.    Due to recent changes in healthcare laws, you may see results of your pathology and/or laboratory studies on MyChart before the doctors have had a chance to review them. We understand that in some cases there may be results that are confusing or concerning to you. Please understand that not all results are received at the same time and often the doctors may need to interpret multiple results in order to provide you with the best plan of care or course of treatment. Therefore, we ask that you please give us  2 business days to thoroughly review all your results before contacting the office for clarification. Should we see a critical lab result, you will be contacted sooner.   If You Need Anything After Your Visit  If you have any questions or concerns for your doctor, please call our main line at [phone] and press option 4 to reach your doctor's medical assistant. If no one answers, please leave a voicemail as directed and we will return your call as soon as possible. Messages left after 4 pm will be answered the following business day.   You may also send us  a message via MyChart. We typically respond to MyChart messages within 1-2 business days.  For prescription refills, please ask your pharmacy to contact our office. Our fax number is [phone].  If you have an urgent issue when the clinic is closed that cannot wait until the next business day, you can page your doctor at the number below.    Please note that while we do our best to be available for urgent issues outside of office  hours, we are not available 24/7.   If you have an urgent issue and are unable to reach us , you may choose to seek medical care at your doctor's office, retail clinic, urgent care center, or emergency room.  If you have a medical emergency, please immediately call 911 or go to the emergency department.  Pager Numbers  - Dr. Hester: [phone]  - Dr. Jackquline: [phone]  - Dr. Claudene: [phone]   - Dr. Raymund: [phone]  In the event of inclement weather, please call our main line at [phone] for an update on the status of any delays or closures.  Dermatology Medication Tips: Please keep the boxes that topical medications come in in order to help keep track of the instructions about where and how to use these. Pharmacies  typically print the medication instructions only on the boxes and not directly on the medication tubes.   If your medication is too expensive, please contact our office at [phone] option 4 or send us  a message through MyChart.   We are unable to tell what your co-pay for medications will be in advance as this is different depending on your insurance coverage. However, we may be able to find a substitute medication at lower cost or fill out paperwork to get insurance to cover a needed medication.   If a prior authorization is required to get your medication covered by your insurance company, please allow us  1-2 business days to complete this process.  Drug prices often vary depending on where the prescription is filled and some pharmacies may offer cheaper prices.  The website www.goodrx.com contains coupons for medications through different pharmacies. The prices here do not account for what the cost may be with help from insurance (it may be cheaper with your insurance), but the website can give you the price if you did not use any insurance.  - You can print the associated coupon and take it with your prescription to the pharmacy.  - You may also  stop by our office during regular business hours and pick up a GoodRx coupon card.  - If you need your prescription sent electronically to a different pharmacy, notify our office through [facility] or by phone at [phone] option 4.     Si Usted Necesita Algo Despus de Su Visita  Tambin puede enviarnos un mensaje a travs de Clinical Cytogeneticist. Por lo general respondemos a los mensajes de MyChart en el transcurso de 1 a 2 das hbiles.  Para renovar recetas, por favor pida a su farmacia que se ponga en contacto con nuestra oficina. Randi lakes de fax es [place] [phone].  Si tiene un asunto urgente cuando la clnica est cerrada y que no puede esperar hasta el siguiente da hbil, puede llamar/localizar a su doctor(a) al nmero que aparece a continuacin.   Por favor, tenga en cuenta que aunque hacemos todo lo posible para estar disponibles para asuntos urgentes fuera del horario de [place], no estamos disponibles las 24 horas del da, los 7 [address]  [address] de la [place].   Si tiene un problema urgente y no puede comunicarse con nosotros, puede optar por buscar atencin mdica  en el consultorio de su doctor(a), en una clnica privada, en un centro de atencin urgente o en una sala de emergencias.  Si tiene engineer, drilling, por favor llame inmediatamente al 911 o vaya a la sala de emergencias.  Nmeros de bper  - Dr. Hester: [phone]  - Dra. Jackquline: [phone]  - Dr. Claudene: [phone]  - Dra. Kitts: [phone]  En caso de inclemencias del [place], por favor llame a nuestra lnea principal al [phone] para una actualizacin sobre el estado de cualquier retraso o cierre.  Consejos para la medicacin en dermatologa: Por favor, guarde las cajas en las que vienen los medicamentos de uso tpico para ayudarle a seguir las instrucciones sobre dnde y cmo usarlos. Las farmacias generalmente imprimen las instrucciones del medicamento slo en las cajas y no directamente en los  tubos del [place].   Si su medicamento es muy caro, por favor, pngase en contacto con landry rieger llamando al [phone] y presione la opcin 4 o envenos un mensaje a travs de Clinical Cytogeneticist.   No podemos decirle cul ser su copago por los medicamentos por adelantado ya que esto es diferente  dependiendo de la cobertura de su seguro. Sin embargo, es posible que podamos encontrar un medicamento sustituto a audiological scientist un formulario para que el seguro cubra el medicamento que se considera necesario.   Si se requiere una autorizacin previa para que su compaa de seguros cubra su medicamento, por favor permtanos de 1 a 2 das hbiles para completar este proceso.  Los precios de los medicamentos varan con frecuencia dependiendo del environmental consultant de dnde se surte la receta y alguna farmacias pueden ofrecer precios ms baratos.  El sitio web www.goodrx.com tiene cupones para medicamentos de health and safety inspector. Los precios aqu no tienen en cuenta lo que podra costar con la ayuda del seguro (puede ser ms barato con su seguro), pero el sitio web puede darle el precio si no utiliz tourist information centre manager.  - Puede imprimir el cupn correspondiente y llevarlo con su receta a la farmacia.  - Tambin puede pasar por nuestra oficina durante el horario de atencin regular y education officer, museum una tarjeta de cupones de GoodRx.  - Si necesita que su receta se enve electrnicamente a una farmacia diferente, informe a nuestra oficina a travs de MyChart de [place] o por telfono llamando al [phone] y presione la opcin 4.

## 2024-11-14 NOTE — Progress Notes (Unsigned)
 Follow-Up Visit   Subjective  Amanda Dalton is a 89 y.o. female who presents for the following: Skin Cancer Screening and Full Body Skin Exam  Hx of scc Hx of breast ca in right breast  Hx of isks  Hx of Stasis Dermatitis at b/l legs   The patient presents for Total-Body Skin Exam (TBSE) for skin cancer screening and mole check. The patient has spots, moles and lesions to be evaluated, some may be new or changing and the patient may have concern these could be cancer.  The following portions of the chart were reviewed this encounter and updated as appropriate: medications, allergies, medical history  Review of Systems:  No other skin or systemic complaints except as noted in HPI or Assessment and Plan.  Objective  Well appearing patient in no apparent distress; mood and affect are within normal limits.  A full examination was performed including scalp, head, eyes, ears, nose, lips, neck, chest, axillae, abdomen, back, buttocks, bilateral upper extremities, bilateral lower extremities, hands, feet, fingers, toes, fingernails, and toenails. All findings within normal limits unless otherwise noted below.   Relevant physical exam findings are noted in the Assessment and Plan. 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  - Call if any new or changing lesions are noted between office visits  hx of breast cancer Right Breast 1996 s/p R modified radical mastectomy  Chemo with tamoxifen no evidence of recurrence, no lymphadenopathy.    SKIN CANCER SCREENING PERFORMED TODAY.  ACTINIC DAMAGE - Chronic condition, secondary to  cumulative UV/sun exposure - diffuse scaly erythematous macules with underlying dyspigmentation - Recommend daily broad spectrum sunscreen SPF 30+ to sun-exposed areas, reapply every 2 hours as needed.  - Staying in the shade or wearing long sleeves, sun glasses (UVA+UVB protection) and wide brim hats (4-inch brim around the entire circumference of the hat) are also recommended for sun protection.  - Call for new or changing lesions.  LENTIGINES, SEBORRHEIC KERATOSES, HEMANGIOMAS - Benign normal skin lesions - Benign-appearing - Call for any changes  MELANOCYTIC NEVI - Tan-brown and/or pink-flesh-colored symmetric macules and papules - Benign appearing on exam today - Observation - Call clinic for new or changing moles - Recommend daily use of broad spectrum spf 30+ sunscreen to sun-exposed areas.    STASIS DERMATITIS legs Exam: Erythematous, scaly patches involving the ankles and distal lower legs with associated lower leg edema. Chronic and persistent condition with duration or expected duration over one year. Condition is symptomatic / bothersome to patient. Not to goal. Stasis in the legs causes chronic leg swelling, which may result in itchy or painful rashes, skin discoloration, skin texture changes, and sometimes ulceration.  Recommend daily graduated compression hose/stockings- easiest to put on first thing in morning, remove at bedtime.  Elevate legs as much as possible. Avoid salt/sodium rich foods. Treatment Plan: Recommend Compression socs    INFLAMED SEBORRHEIC KERATOSIS (7) upper back x 7 (7) Symptomatic, irritating, patient would like treated. - Destruction of lesion - upper back x 7 (7) Complexity: simple   Destruction method: cryotherapy   Informed consent: discussed and consent obtained  Timeout:  patient name, date of birth, surgical site, and procedure verified Lesion destroyed using liquid nitrogen: Yes   Region frozen until ice ball extended beyond lesion:  Yes   Outcome: patient tolerated procedure well with no complications   Post-procedure details: wound care instructions given    Return in about 1 year (around 11/14/2025) for TBSE.  IEleanor Blush, CMA, am acting as scribe for Alm Rhyme, MD.   Documentation: I have reviewed the above documentation for accuracy and completeness, and I agree with the above.  Alm Rhyme, MD

## 2024-11-15 ENCOUNTER — Encounter: Payer: Self-pay | Admitting: Dermatology

## 2024-11-15 ENCOUNTER — Ambulatory Visit: Payer: Medicare Other | Admitting: Dermatology

## 2024-12-10 ENCOUNTER — Encounter: Payer: Self-pay | Admitting: Podiatry

## 2024-12-10 ENCOUNTER — Ambulatory Visit: Admitting: Podiatry

## 2024-12-10 DIAGNOSIS — M79675 Pain in left toe(s): Secondary | ICD-10-CM | POA: Diagnosis not present

## 2024-12-10 DIAGNOSIS — B351 Tinea unguium: Secondary | ICD-10-CM

## 2024-12-10 DIAGNOSIS — M79674 Pain in right toe(s): Secondary | ICD-10-CM

## 2024-12-17 NOTE — Progress Notes (Signed)
"  °  Subjective:  Patient ID: [name], female    DOB: 1931/02/28,  MRN: [id number]  Amanda Dalton presents to clinic today for painful thick toenails that are difficult to trim. Pain interferes with ambulation. Aggravating factors include wearing enclosed shoe gear. Pain is relieved with periodic professional debridement.  Chief Complaint  Patient presents with   Nail Problem    She denies being diabetic. She will see Dr. Valora in Feb   New problem(s): None.   PCP is Valora Lynwood FALCON, MD.  Allergies[1]  Review of Systems: Negative except as noted in the HPI.  Objective:  There were no vitals filed for this visit. Amanda Dalton is a pleasant 89 y.o. female in NAD. AAO x 3.  Vascular Examination: Vascular status intact b/l with palpable pedal pulses. Pedal hair sparse. CFT immediate b/l. No edema. No pain with calf compression b/l. Skin temperature gradient WNL b/l.   Neurological Examination: Sensation grossly intact b/l with 10 gram monofilament. Vibratory sensation intact b/l.   Dermatological Examination: Pedal skin with normal turgor, texture and tone b/l. Toenails 1-5 b/l thick, discolored, elongated with subungual debris and pain on dorsal palpation. Porokeratotic lesion(s) submet head 2 left foot. No erythema, no edema, no drainage, no fluctuance.  Musculoskeletal Examination: Muscle strength 5/5 to b/l LE. Hammertoe(s) 2-5 b/l. Utilizes cane for ambulation assistance.  Radiographs: None  Assessment/Plan: 1. Pain due to onychomycosis of toenails of both feet   -Patient was evaluated today. All questions/concerns addressed on today's visit. -Patient to continue soft, supportive shoe gear daily. -Toenails 1-5 b/l were debrided in length and girth with sterile nail nippers and dremel without iatrogenic bleeding.  -As a courtesy, porokeratotic lesion(s) submet head 2 left foot pared and enucleated without complication or incident. Total number pared=1. -Patient/POA to call  should there be question/concern in the interim.   Return in about 3 months (around 03/10/2025).  Delon LITTIE Merlin, DPM      [place] LOCATION: [address], [place] [address]                   Office [phone]   [place] LOCATION: [address] [place], [place] [address] Office [phone]     [1]  Allergies Allergen Reactions   Paroxetine Hcl Nausea Only and Nausea And Vomiting   "

## 2025-02-07 ENCOUNTER — Ambulatory Visit: Admitting: Physician Assistant

## 2025-03-11 ENCOUNTER — Ambulatory Visit: Admitting: Podiatry

## 2025-11-14 ENCOUNTER — Ambulatory Visit: Admitting: Dermatology
